# Patient Record
Sex: Male | Born: 1945 | Race: White | Hispanic: No | Marital: Married | Smoking: Former smoker
Health system: Southern US, Community
[De-identification: ages and names within clinical notes are randomized; demographics above are authoritative.]

## PROBLEM LIST (undated history)

## (undated) DIAGNOSIS — K760 Fatty (change of) liver, not elsewhere classified: Secondary | ICD-10-CM

## (undated) DIAGNOSIS — R011 Cardiac murmur, unspecified: Secondary | ICD-10-CM

## (undated) DIAGNOSIS — H6980 Other specified disorders of Eustachian tube, unspecified ear: Secondary | ICD-10-CM

## (undated) DIAGNOSIS — I517 Cardiomegaly: Secondary | ICD-10-CM

## (undated) DIAGNOSIS — Z77090 Contact with and (suspected) exposure to asbestos: Secondary | ICD-10-CM

## (undated) DIAGNOSIS — Q2381 Bicuspid aortic valve: Secondary | ICD-10-CM

## (undated) DIAGNOSIS — N486 Induration penis plastica: Secondary | ICD-10-CM

## (undated) DIAGNOSIS — N200 Calculus of kidney: Secondary | ICD-10-CM

## (undated) DIAGNOSIS — I10 Essential (primary) hypertension: Secondary | ICD-10-CM

## (undated) DIAGNOSIS — R410 Disorientation, unspecified: Secondary | ICD-10-CM

## (undated) DIAGNOSIS — H6990 Unspecified Eustachian tube disorder, unspecified ear: Secondary | ICD-10-CM

## (undated) DIAGNOSIS — G4733 Obstructive sleep apnea (adult) (pediatric): Secondary | ICD-10-CM

## (undated) DIAGNOSIS — H9190 Unspecified hearing loss, unspecified ear: Secondary | ICD-10-CM

## (undated) DIAGNOSIS — E785 Hyperlipidemia, unspecified: Secondary | ICD-10-CM

## (undated) DIAGNOSIS — U071 COVID-19: Secondary | ICD-10-CM

## (undated) DIAGNOSIS — Z8719 Personal history of other diseases of the digestive system: Secondary | ICD-10-CM

## (undated) DIAGNOSIS — R7303 Prediabetes: Secondary | ICD-10-CM

## (undated) DIAGNOSIS — E119 Type 2 diabetes mellitus without complications: Secondary | ICD-10-CM

## (undated) DIAGNOSIS — I351 Nonrheumatic aortic (valve) insufficiency: Secondary | ICD-10-CM

## (undated) DIAGNOSIS — K219 Gastro-esophageal reflux disease without esophagitis: Secondary | ICD-10-CM

## (undated) DIAGNOSIS — R319 Hematuria, unspecified: Secondary | ICD-10-CM

## (undated) DIAGNOSIS — I712 Thoracic aortic aneurysm, without rupture: Secondary | ICD-10-CM

## (undated) DIAGNOSIS — Q231 Congenital insufficiency of aortic valve: Secondary | ICD-10-CM

## (undated) DIAGNOSIS — I480 Paroxysmal atrial fibrillation: Secondary | ICD-10-CM

## (undated) DIAGNOSIS — I350 Nonrheumatic aortic (valve) stenosis: Secondary | ICD-10-CM

## (undated) DIAGNOSIS — I2510 Atherosclerotic heart disease of native coronary artery without angina pectoris: Secondary | ICD-10-CM

## (undated) DIAGNOSIS — I509 Heart failure, unspecified: Secondary | ICD-10-CM

## (undated) DIAGNOSIS — N183 Chronic kidney disease, stage 3 (moderate): Secondary | ICD-10-CM

## (undated) HISTORY — DX: Fatty (change of) liver, not elsewhere classified: K76.0

## (undated) HISTORY — DX: COVID-19: U07.1

## (undated) HISTORY — DX: Contact with and (suspected) exposure to asbestos: Z77.090

## (undated) HISTORY — PX: LEFT ATRIAL APPENDAGE OCCLUSION: SHX173A

## (undated) HISTORY — PX: SHOULDER SURGERY: SHX246

## (undated) HISTORY — DX: Chronic kidney disease, stage 3 (moderate): N18.3

## (undated) HISTORY — DX: Congenital insufficiency of aortic valve: Q23.1

## (undated) HISTORY — DX: Disorientation, unspecified: R41.0

## (undated) HISTORY — DX: Paroxysmal atrial fibrillation: I48.0

## (undated) HISTORY — DX: Thoracic aortic aneurysm, without rupture: I71.2

## (undated) HISTORY — DX: Calculus of kidney: N20.0

## (undated) HISTORY — DX: Bicuspid aortic valve: Q23.81

## (undated) HISTORY — DX: Type 2 diabetes mellitus without complications: E11.9

## (undated) HISTORY — DX: Atherosclerotic heart disease of native coronary artery without angina pectoris: I25.10

## (undated) HISTORY — DX: Cardiac murmur, unspecified: R01.1

## (undated) HISTORY — DX: Essential (primary) hypertension: I10

## (undated) HISTORY — DX: Prediabetes: R73.03

## (undated) HISTORY — DX: Hyperlipidemia, unspecified: E78.5

## (undated) HISTORY — PX: COLONOSCOPY: SHX174

## (undated) HISTORY — DX: Gastro-esophageal reflux disease without esophagitis: K21.9

---

## 2003-02-21 ENCOUNTER — Ambulatory Visit (HOSPITAL_COMMUNITY): Admission: RE | Admit: 2003-02-21 | Discharge: 2003-02-21 | Payer: Self-pay | Admitting: Gastroenterology

## 2008-04-30 ENCOUNTER — Emergency Department (HOSPITAL_BASED_OUTPATIENT_CLINIC_OR_DEPARTMENT_OTHER): Admission: EM | Admit: 2008-04-30 | Discharge: 2008-04-30 | Payer: Self-pay | Admitting: Emergency Medicine

## 2008-06-18 ENCOUNTER — Ambulatory Visit: Payer: Self-pay | Admitting: Cardiology

## 2008-06-26 ENCOUNTER — Ambulatory Visit: Payer: Self-pay

## 2008-06-26 ENCOUNTER — Encounter: Payer: Self-pay | Admitting: Cardiology

## 2008-07-02 ENCOUNTER — Ambulatory Visit: Payer: Self-pay | Admitting: Cardiology

## 2008-07-10 ENCOUNTER — Encounter: Payer: Self-pay | Admitting: Cardiology

## 2008-07-10 ENCOUNTER — Ambulatory Visit (HOSPITAL_COMMUNITY): Admission: RE | Admit: 2008-07-10 | Discharge: 2008-07-10 | Payer: Self-pay | Admitting: Cardiology

## 2008-07-10 ENCOUNTER — Ambulatory Visit: Payer: Self-pay | Admitting: Cardiology

## 2008-07-16 DIAGNOSIS — E785 Hyperlipidemia, unspecified: Secondary | ICD-10-CM | POA: Insufficient documentation

## 2008-07-16 DIAGNOSIS — E1159 Type 2 diabetes mellitus with other circulatory complications: Secondary | ICD-10-CM | POA: Insufficient documentation

## 2008-07-16 DIAGNOSIS — I10 Essential (primary) hypertension: Secondary | ICD-10-CM

## 2009-04-19 ENCOUNTER — Telehealth: Payer: Self-pay | Admitting: Cardiology

## 2009-05-02 ENCOUNTER — Ambulatory Visit: Payer: Self-pay

## 2009-05-02 ENCOUNTER — Encounter: Payer: Self-pay | Admitting: Cardiology

## 2009-05-02 ENCOUNTER — Ambulatory Visit: Payer: Self-pay | Admitting: Cardiology

## 2009-05-02 ENCOUNTER — Ambulatory Visit (HOSPITAL_COMMUNITY): Admission: RE | Admit: 2009-05-02 | Discharge: 2009-05-02 | Payer: Self-pay | Admitting: Cardiology

## 2009-05-15 ENCOUNTER — Ambulatory Visit: Payer: Self-pay | Admitting: Cardiology

## 2009-05-27 ENCOUNTER — Ambulatory Visit: Payer: Self-pay | Admitting: Cardiology

## 2009-05-31 LAB — CONVERTED CEMR LAB
BUN: 19 mg/dL (ref 6–23)
CO2: 30 meq/L (ref 19–32)
Calcium: 9.2 mg/dL (ref 8.4–10.5)
Chloride: 106 meq/L (ref 96–112)
Creatinine, Ser: 1.4 mg/dL (ref 0.4–1.5)
GFR calc non Af Amer: 54.44 mL/min (ref >60)
Glucose, Bld: 129 mg/dL — ABNORMAL HIGH (ref 70–99)
Potassium: 4 meq/L (ref 3.5–5.1)
Sodium: 143 meq/L (ref 135–145)

## 2009-06-17 ENCOUNTER — Ambulatory Visit: Payer: Self-pay | Admitting: Cardiology

## 2010-03-20 ENCOUNTER — Telehealth: Payer: Self-pay | Admitting: Cardiology

## 2010-03-20 DIAGNOSIS — I712 Thoracic aortic aneurysm, without rupture: Secondary | ICD-10-CM | POA: Insufficient documentation

## 2010-03-27 ENCOUNTER — Telehealth: Payer: Self-pay | Admitting: Cardiology

## 2010-06-18 ENCOUNTER — Ambulatory Visit (HOSPITAL_COMMUNITY)
Admission: RE | Admit: 2010-06-18 | Discharge: 2010-06-18 | Payer: Self-pay | Source: Home / Self Care | Attending: Cardiology | Admitting: Cardiology

## 2010-06-18 ENCOUNTER — Ambulatory Visit (HOSPITAL_COMMUNITY): Admission: RE | Admit: 2010-06-18 | Payer: Self-pay | Source: Home / Self Care | Admitting: Cardiology

## 2010-06-18 LAB — BASIC METABOLIC PANEL
BUN: 15 mg/dL (ref 6–23)
CO2: 27 mEq/L (ref 19–32)
Calcium: 9.3 mg/dL (ref 8.4–10.5)
Chloride: 105 mEq/L (ref 96–112)
Creatinine, Ser: 1.23 mg/dL (ref 0.4–1.5)
GFR calc Af Amer: >60 mL/min (ref >60)
GFR calc non Af Amer: 59 mL/min — ABNORMAL LOW (ref >60)
Glucose, Bld: 112 mg/dL — ABNORMAL HIGH (ref 70–99)
Potassium: 4.1 mEq/L (ref 3.5–5.1)
Sodium: 140 mEq/L (ref 135–145)

## 2010-06-19 ENCOUNTER — Other Ambulatory Visit: Payer: Self-pay | Admitting: Cardiology

## 2010-06-19 ENCOUNTER — Ambulatory Visit
Admission: RE | Admit: 2010-06-19 | Discharge: 2010-06-19 | Payer: Self-pay | Source: Home / Self Care | Attending: Cardiology | Admitting: Cardiology

## 2010-06-19 ENCOUNTER — Ambulatory Visit (HOSPITAL_COMMUNITY)
Admission: RE | Admit: 2010-06-19 | Discharge: 2010-06-19 | Payer: Self-pay | Source: Home / Self Care | Attending: Cardiology | Admitting: Cardiology

## 2010-06-19 ENCOUNTER — Ambulatory Visit: Admission: RE | Admit: 2010-06-19 | Discharge: 2010-06-19 | Payer: Self-pay | Source: Home / Self Care

## 2010-06-19 ENCOUNTER — Encounter: Payer: Self-pay | Admitting: Cardiology

## 2010-06-19 DIAGNOSIS — R0602 Shortness of breath: Secondary | ICD-10-CM | POA: Insufficient documentation

## 2010-06-19 DIAGNOSIS — R079 Chest pain, unspecified: Secondary | ICD-10-CM | POA: Insufficient documentation

## 2010-06-19 LAB — CBC WITH DIFFERENTIAL/PLATELET
Basophils Absolute: 0 10*3/uL (ref 0.0–0.1)
Basophils Relative: 0.7 % (ref 0.0–3.0)
Eosinophils Absolute: 0.2 10*3/uL (ref 0.0–0.7)
Eosinophils Relative: 3.6 % (ref 0.0–5.0)
HCT: 44.2 % (ref 39.0–52.0)
Hemoglobin: 15.1 g/dL (ref 13.0–17.0)
Lymphocytes Relative: 27.4 % (ref 12.0–46.0)
Lymphs Abs: 1.3 10*3/uL (ref 0.7–4.0)
MCHC: 34.2 g/dL (ref 30.0–36.0)
MCV: 93.9 fl (ref 78.0–100.0)
Monocytes Absolute: 0.3 10*3/uL (ref 0.1–1.0)
Monocytes Relative: 6.4 % (ref 3.0–12.0)
Neutro Abs: 2.9 10*3/uL (ref 1.4–7.7)
Neutrophils Relative %: 61.9 % (ref 43.0–77.0)
Platelets: 182 10*3/uL (ref 150.0–400.0)
RBC: 4.71 Mil/uL (ref 4.22–5.81)
RDW: 13 % (ref 11.5–14.6)
WBC: 4.6 10*3/uL (ref 4.5–10.5)

## 2010-06-19 LAB — BASIC METABOLIC PANEL
BUN: 22 mg/dL (ref 6–23)
CO2: 30 mEq/L (ref 19–32)
Calcium: 9.5 mg/dL (ref 8.4–10.5)
Chloride: 101 mEq/L (ref 96–112)
Creatinine, Ser: 1.2 mg/dL (ref 0.4–1.5)
GFR: 65.44 mL/min (ref >60.00)
Glucose, Bld: 134 mg/dL — ABNORMAL HIGH (ref 70–99)
Potassium: 3.9 mEq/L (ref 3.5–5.1)
Sodium: 140 mEq/L (ref 135–145)

## 2010-06-19 LAB — PROTIME-INR
INR: 1 ratio (ref 0.8–1.0)
Prothrombin Time: 11 s (ref 9.7–11.8)

## 2010-06-23 ENCOUNTER — Telehealth: Payer: Self-pay | Admitting: Cardiology

## 2010-06-24 ENCOUNTER — Ambulatory Visit
Admission: RE | Admit: 2010-06-24 | Payer: Self-pay | Source: Home / Self Care | Attending: Cardiology | Admitting: Cardiology

## 2010-06-24 HISTORY — PX: CARDIAC CATHETERIZATION: SHX172

## 2010-06-26 ENCOUNTER — Encounter: Payer: Self-pay | Admitting: Cardiology

## 2010-06-27 ENCOUNTER — Telehealth (INDEPENDENT_AMBULATORY_CARE_PROVIDER_SITE_OTHER): Payer: Self-pay | Admitting: *Deleted

## 2010-06-30 ENCOUNTER — Ambulatory Visit: Admission: RE | Admit: 2010-06-30 | Discharge: 2010-06-30 | Payer: Self-pay | Source: Home / Self Care

## 2010-06-30 ENCOUNTER — Encounter: Payer: Self-pay | Admitting: Internal Medicine

## 2010-06-30 ENCOUNTER — Encounter (HOSPITAL_COMMUNITY)
Admission: RE | Admit: 2010-06-30 | Discharge: 2010-07-15 | Payer: Self-pay | Source: Home / Self Care | Attending: Cardiology | Admitting: Cardiology

## 2010-07-07 ENCOUNTER — Ambulatory Visit: Admit: 2010-07-07 | Payer: Self-pay | Admitting: Cardiology

## 2010-07-15 NOTE — Progress Notes (Signed)
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

## 2010-07-15 NOTE — Progress Notes (Signed)
Summary: scheduling MRA/ECHO   ---- 03/27/2010 4:26 PM, Omar Person wrote: ---- Converted from flag ---- Thurston Hole call Mr. Mabey  on 03/21/10  and left vm/ call 10/13 and spoke with wife.  Per wife husband [place] and he want to wait til next year to set up.  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

## 2010-07-17 NOTE — Progress Notes (Signed)
Summary: question re Catheterization   Phone Note Call from Patient Call back at [phone]   Caller: Patient Reason for Call: Talk to Nurse Summary of Call: pt has a cold pt wants to know if its still okay for him to get Cardiac Catheterization on Tuesday January 10,2012 with Dr. Marca Ancona.  Initial call taken by: Roe Coombs,  June 23, 2010 10:04 AM     Appended Document: question re Catheterization OK as long as he does not have a fever.   Appended Document: question re Catheterization pt aware

## 2010-07-17 NOTE — Miscellaneous (Signed)
Summary: MRA/MRI  Clinical Lists Changes  Orders: Added new Referral order of MRA (MRA) - Signed

## 2010-07-17 NOTE — Progress Notes (Signed)
Summary: Nuclear Pre-Procedure  Phone Note Outgoing Call Call back at [facility] Phone [phone]   Call placed by: Stanton Kidney, EMT-P,  June 27, 2010 3:42 PM Call placed to: Patient Action Taken: Phone Call Completed Summary of Call: Reviewed information on Myoview Information Sheet (see scanned document for further details).  Spoke with the patient. Stanton Kidney, EMT-P  June 27, 2010 3:45 PM     Nuclear Med Background Indications for Stress Test: Evaluation for Ischemia   History: Echo, Heart Catheterization, Myocardial Perfusion Study  History Comments: 1/10 MPS: NL, EF=56% 06/19/10 Echo: EF=55%-60% 06/24/10 Heart Cath: NL  Symptoms: Chest Pain, DOE, Palpitations, Rapid HR    Nuclear Pre-Procedure Cardiac Risk Factors: Family History - CAD, History of Smoking, Hypertension, Lipids, RBBB Height (in): 71

## 2010-07-17 NOTE — Assessment & Plan Note (Signed)
Summary: Cardiology Nuclear Testing  Nuclear Med Background Indications for Stress Test: Evaluation for Ischemia   History: Echo, Heart Catheterization, Myocardial Perfusion Study  History Comments: 1/10 MPS: NL, EF=56% 06/19/10 Echo: EF=55%-60% 06/24/10 Heart Cath: NL  Symptoms: Chest Pain, DOE, Palpitations, Rapid HR, SOB    Nuclear Pre-Procedure Cardiac Risk Factors: Family History - CAD, History of Smoking, Hypertension, Lipids, RBBB Caffeine/Decaff Intake: none NPO After: 12:00 PM Lungs: clear IV 0.9% NS with Angio Cath: 22g     IV Site: R Hand IV Started by: Cathlyn Parsons, RN Chest Size (in) 44     Height (in): 71 Weight (lb): 211 BMI: 29.53  Nuclear Med Study 1 or 2 day study:  1 day     Stress Test Type:  Stress Reading MD:  Arvilla Meres, MD     Referring MD:  D.McLean Resting Radionuclide:  Technetium 66m Tetrofosmin     Resting Radionuclide Dose:  11 mCi  Stress Radionuclide:  Technetium 40m Tetrofosmin     Stress Radionuclide Dose:  33 mCi   Stress Protocol Exercise Time (min):  10 min     Max HR:  153 bpm     Predicted Max HR:  157 bpm  Max Systolic BP: 206 mm Hg     Percent Max HR:  97.45 %     METS: 11.70 Rate Pressure Product:  [address]    Stress Test Technologist:  Milana Na, EMT-P     Nuclear Technologist:  Domenic Polite, CNMT  Rest Procedure  Myocardial perfusion imaging was performed at rest 45 minutes following the intravenous administration of Technetium 42m Tetrofosmin.  Stress Procedure  The patient exercised for 10:00. The patient stopped due to fatigue, sob, and denied any chest pain.  There were no significant ST-T wave changes and rare pacs/pvcs.  Technetium 31m Tetrofosmin was injected at peak exercise and myocardial perfusion imaging was performed after a brief delay.  QPS Raw Data Images:  Normal; no motion artifact; normal heart/lung ratio. Stress Images:  Normal homogeneous uptake in all areas of the myocardium. Rest  Images:  Normal homogeneous uptake in all areas of the myocardium. Subtraction (SDS):  Normal Transient Ischemic Dilatation:  .87  (Normal <1.22)  Lung/Heart Ratio:  .34  (Normal <0.45)  Quantitative Gated Spect Images QGS EDV:  79 ml QGS ESV:  25 ml QGS EF:  69 % QGS cine images:  Normal wall motion.   Findings Normal nuclear study      Overall Impression  Exercise Capacity: Good exercise capacity. BP Response: Hypertensive blood pressure response. Clinical Symptoms: There is dyspnea. ECG Impression: No significant ST segment change suggestive of ischemia. Overall Impression: Normal stress nuclear study.  Appended Document: Cardiology Nuclear Testing Discussed with patient.

## 2010-07-17 NOTE — Letter (Signed)
Summary: Cardiac Catheterization Instructions- [facility]  [employer], Main Office  [address]   [place], [place] [address]   Phone: [phone]  Fax: [phone]     06/19/2010 MRN: [id number]  Samuel Willis [address] [place], [place]  [address]  Dear [name],   You are scheduled for a Cardiac Catheterization on Tuesday January 10,2012 with Dr. Marca Ancona.  Please arrive to the 1st floor of the Heart and Vascular Center at [facility] at 7:30 am on the day of your procedure. Please do not arrive before 6:30 a.m. Call the Heart and Vascular Center at [phone] if you are unable to make your appointmnet. The Code to get into the parking garage under the building is 0030. Take the elevators to the 1st floor. You must have someone to drive you home. Someone must be with you for the first 24 hours after you arrive home. Please wear clothes that are easy to get on and off and wear slip-on shoes. Do not eat or drink after midnight except water with your medications that morning. Bring all your medications and current insurance cards with you.    __x_ Make sure you take your aspirin.  _x__ You may take ALL of your medications with water that morning.     The usual length of stay after your procedure is 2 to 3 hours. This can vary.  If you have any questions, please call the office at the number listed above.   Katina Dung, RN, BSN

## 2010-07-17 NOTE — Assessment & Plan Note (Signed)
Summary: 10:00/f1y/mj  Medications Added COQ-10 50 MG CAPS (COENZYME Q10) once daily FLEXERIL 10 MG TABS (CYCLOBENZAPRINE HCL) once daily PEPCID AC 10 MG TABS (FAMOTIDINE) once daily      Allergies Added: NKDA  Visit Type:  1 year follow up   CC:  palpitations, shortness of breath, and CP.  History of Present Illness: 65 yo with history of bicuspid aortic valve and mild AS/mild AR as well as rare paroxysmal atrial fibrillation presents for followup.  Echo today showed mild LV hypertrophy, grade II diastolic dysfunction, bicuspid aortic valve with mild AS.  MRA aorta showed aortic root 3.7 cm and ascending aorta 3.7 cm.  Patient came in to the office today because he has been significantly more short of breath with exertion for the last 6 months.  He is able to do mild exertion without any problem.  Moderate to heavy exertion, however, now gets him very winded.  Carrying a heavy load, using a chainsaw, or walking quickly up an incline will leave him profoundly short of breath.  Prior to 6 months ago, he had no problems with dyspnea.  No orthopnea or PND.  Two weeks ago, he had sharp severe central chest pain.  This lasted about 2 days then resolved.  It was nonexertional.  He has not had chest pain before or since.  He still feels like he occasionally goes into atrial fibrillation.  He will feel his heart race irregularly at 160 bpm about 4 times a year.  It never lasts more than about 3-4 hours.    ECG: NSR, iRBBB  Labs (12/10): creatinine 1.4  Current Medications (verified): 1)  Benazepril-Hydrochlorothiazide 20-12.5 Mg Tabs (Benazepril-Hydrochlorothiazide) .... Take Two Tablets Daily 2)  Fish Oil 300 Mg Caps (Omega-3 Fatty Acids) .... Once Daily 3)  Aspirin Ec 325 Mg Tbec (Aspirin) .... Take One Tablet By Mouth Daily 4)  Zocor 40 Mg Tabs (Simvastatin) .... One Tablet in The Evening 5)  Coq-10 50 Mg Caps (Coenzyme Q10) .... Once Daily 6)  Flexeril 10 Mg Tabs (Cyclobenzaprine Hcl) ....  Once Daily 7)  Pepcid Ac 10 Mg Tabs (Famotidine) .... Once Daily  Allergies (verified): No Known Drug Allergies  Past History:  Past Medical History: 1. Paroxysmal atrial fibrillation.  Patient has a brief episode of irregular heart beating every 1-2 years.  One was documented as atrial fibrillation.  I have spoken to the patient about coumadin in the past.  His CHADSVASC score is 1.  He decided against Coumadin.  He is on aspirin 325 mg a day. 2. Hypertension. 3. Hyperlipidemia. 4. Exercise treadmill Myoview done in January 2010.  The patient exercised for 10 minutes and 46 seconds.  He stopped due to fatigue.  EF was 56%.  There was normal perfusion with no evidence for ischemia or infarction. 5. Aortic stenosis and aortic regurgitation (bicuspid aortic valve).  The patient did have an echocardiogram done on June 26, 2008.  EF was 65%.  There were no regional wall motion abnormalities.  There was moderate aortic regurgitation.  There was mild aortic stenosis by mean gradient which was 12 mmHg.  There was mild ascending aorta and aortic root dilation. There was mild mitral regurgitation, mild left atrial enlargement, and right atrial enlargement.   TEE was done, confirming a functionally bicuspid aortic valve with fusion of the right and noncoronary cusps.  There was mild aortic stenosis and moderate aortic insufficiency.  The ascending aorta was mildly dilated at 3.9 cm. TTE (1/10) showed EF 60%  with mild LVH, mild AS (mean gradient 12), mild to moderate AR, normal LV size.  TTE (1/12) with EF 60%, mild LVH, moderate diastolic dysfunction, mild AS (mean gradient 11 mmHg), mild AI, normal RV. MRA chest (1/12) with 3.7 cm aortic root and ascending aorta.   Family History: Reviewed history from 05/15/2009 and no changes required. The patient's mother has a history of stroke, also has questionable history of atrial fibrillation.  The patient's father had an MI in his 31s.   Social  History: Reviewed history from 05/15/2009 and no changes required. The patient is married, lives in [place], is a nonsmoker.  He owns several businesses.  He is not drinking alcohol.  He works out with a Psychologist, educational a couple of days a week and gets a significant amount of cardiovascular exercise.   Review of Systems       All systems reviewed and negative except as per HPI.   Vital Signs:  Patient profile:   65 year old male Height:      71 inches Weight:      219 pounds BMI:     30.65 Pulse rate:   67 / minute BP sitting:   116 / 68  (left arm) Cuff size:   regular  Vitals Entered By: Caralee Ates CMA (June 19, 2010 9:41 AM)  Physical Exam  General:  Well developed, well nourished, in no acute distress. Neck:  Neck supple, no JVD. No masses, thyromegaly or abnormal cervical nodes. Lungs:  Clear bilaterally to auscultation and percussion. Heart:  Non-displaced PMI, chest non-tender; regular rate and rhythm, S1, S2 without rubs or gallops. 2/6 early systolic crescendo-decrescendo murmur at RUSB.  No diastolic murmur.  Carotid upstroke normal, no bruit.  No edema, no varicosities. Abdomen:  Bowel sounds positive; abdomen soft and non-tender without masses, organomegaly, or hernias noted. No hepatosplenomegaly. Extremities:  No clubbing or cyanosis. Neurologic:  Alert and oriented x 3. Psych:  Normal affect.   Impression & Recommendations:  Problem # 1:  SHORTNESS OF BREATH (ICD-786.05) Patient has worrisome new exertional dyspnea.  This occurs with moderate to heavy exertion and is new for him.  Echo showed no significant progression of aortic stenosis.  He had one episode of prolonged chest pain but has no evidence for MI on ECG.  No volume overload on exam.  We had a long discussion regarding LHC versus myoview.  We opted for catheterization.  Given his exertional symptoms and his risk factors, I think that this is reasonable.  I explained to him the 06/998 risk of severe  complication from the procedure.  He will continue ASA, statin, ACEI.  I will call his PCP to get most recent lipids record.   Problem # 2:  BICUSPID AORTIC VALVE (ICD-746.4) Patient has a bicuspid aortic valve with mild AS, mild AR, and a mildly dilated ascending aorta (3.7 cm by MRA 1/12).  Valvular disease has remained stable between 1/10 and 1/12.  I will continue monitoring AS with echo in 1 year.  Can repeat MRA of the aorta in 2 years.   Problem # 3:  ATRIAL FIBRILLATION (ICD-427.31) Paroxysmal.  Patient is symptomatic when it occurs, which is only 3-4 times a year. One episode was documented by ECG as atrial fibrillation.   CHADSVASC score is 1, but if coronary disease is found on cath, it will be 2.  In that case, we will need to readdress anticoagulation.   Other Orders: TLB-BMP (Basic Metabolic Panel-BMET) (80048-METABOL) TLB-CBC Platelet -  w/Differential (85025-CBCD) TLB-PT (Protime) (85610-PTP)  Patient Instructions: 1)  Your physician recommends that you have lab today---BMP/CBC/PT  786.50  786.09 2)  Your physician has requested that you have a cardiac catheterization.  Cardiac catheterization is used to diagnose and/or treat various heart conditions. Doctors may recommend this procedure for a number of different reasons. The most common reason is to evaluate chest pain. Chest pain can be a symptom of coronary artery disease (CAD), and cardiac catheterization can show whether plaque is narrowing or blocking your heart's arteries. This procedure is also used to evaluate the valves, as well as measure the blood flow and oxygen levels in different parts of your heart.  For further information please visit [email or web address]  Please follow instruction sheet, as given. 3)  TUESDAY JANUARY [id number] 4)  Your physician recommends that you schedule a follow-up appointment in: 10-14 days after the cardiac catheterization with Dr Shirlee Latch.

## 2010-10-28 NOTE — Assessment & Plan Note (Signed)
[facility]                            CARDIOLOGY OFFICE NOTE   Samuel Willis, Samuel Willis                        MRN:          [id number]  DATE:06/18/2008                            DOB:          1945-12-20    PRIMARY CARE PHYSICIAN:  Vikki Ports, MD   HISTORY OF PRESENT ILLNESS:  This is a 65 year old with a history of  hypertension, hyperlipidemia, and paroxysmal atrial fibrillation who  presents to Cardiology Clinic for evaluation.  The patient had an  initial episode that was probably atrial fibrillation several years ago  while he was in [place] hunting.  He had an episode where his heart  rate went very fast and was irregular.  This lasted a few hours and then  resolved.  He did not go to the doctor at that time.  He had no other  problems since then until November 2009, when he woke up one night with  chest tightness and had a fast and irregular heart rate.  The chest  tightness was substernal, there was no radiation.  The patient went to  the emergency department.  He was found to be in atrial fibrillation  with a rapid response with the rate in the 150s.  The atrial  fibrillation actually spontaneously converted to normal sinus rhythm  soon after he got to the emergency department.  He was monitored for few  hours and sent home.  Cardiac enzymes were negative.  Of note, the  patient had a cold just before this episode.  He was on Claritin,  Robitussin, and clarithromycin at that time.  The patient has had no  further episodes of tachycardia.  He does quite well in general.  He  does a significant amount of cardiovascular exercise.  He works out with  a Psychologist, educational a couple of days a week.  He has no chest tightness or dyspnea  with exertion.  He does tell me that he snores at night, and he has some  generalized fatigue during the day; however, he does not have any  daytime sleepiness per se.   PAST MEDICAL HISTORY:  1. Paroxysmal atrial  fibrillation.  The patient has had two episodes      that have been recognized so far, one several years ago in which he      did not go to a doctor and then the second in November 2009, both      lasted for only a matter of hours.  2. Hypertension.  3. Hyperlipidemia.   MEDICATIONS:  1. Amlodipine 5 mg daily.  2. Benazepril/HCTZ 20/12.5.  3. Zocor 20 mg daily.  4. Ranitidine.  5. Viagra p.r.n.   FAMILY HISTORY:  The patient's mother has a history of stroke, also has  questionable history of atrial fibrillation.  The patient's father had  an MI in his 64s.   SOCIAL HISTORY:  The patient is married, lives in [place], is a  nonsmoker.  He is not drinking alcohol.  He works out with a Psychologist, educational a  couple of days a week and gets a significant amount  of cardiovascular  exercise.   REVIEW OF SYSTEMS:  Negative except as noted in the history of present  illness.   LABORATORY DATA:  From November 2009, D-dimer is negative, cardiac  enzymes were negative.  From December 2009, creatinine was 1.3.  EKG  today, normal sinus rhythm, there are nonspecific lateral and  anterolateral T-wave changes.   PHYSICAL EXAMINATION:  VITAL SIGNS:  Blood pressure 120/86, heart rate  69 and regular.  GENERAL:  This is a well-developed male in no apparent distress.  NEUROLOGIC:  Alert and oriented x3.  Normal affect.  LUNGS:  Clear to auscultation bilaterally with normal respiratory  effort.  CARDIOVASCULAR:  Heart regular.  S1, S2.  There is no S3 or S4.  There  is a 2/6 crescendo-decrescendo early peaking murmur at the right upper  sternal border.  There is also a 2/6 systolic murmur at the apex.  There  is no peripheral edema.  There are 2+ posterior tibial pulses  bilaterally.  There is no carotid bruit.  NECK:  There is no JVD.  There is no thyromegaly or thyroid nodule.  HEENT:  Normal exam.  ABDOMEN:  Soft, nontender.  No hepatosplenomegaly.  Normal bowel sounds.  EXTREMITIES:  No clubbing or  cyanosis.  MUSCULOSKELETAL:  Normal exam.  SKIN:  Normal exam.   ASSESSMENT AND PLAN:  This is a 65 year old with hypertension,  hyperlipidemia, and paroxysmal atrial fibrillation, who presents to  Cardiology Clinic for evaluation of;  1. Atrial fibrillation.  The patient does have paroxysmal atrial      fibrillation.  He has had two episodes.  His CHADS2 score is 1.  I      did talk to him at length about his risk of stroke.  He lives an      active lifestyle and really does not want to start on Coumadin.  He      is on the borderline for risk with a CHADS2 score of 1, but I think      given his wish, it would be reasonable to start on aspirin 325 mg      daily instead of Coumadin.  He is on no rate-controlling      medications currently.  I do plan on getting an exercise treadmill      Myoview, so I will avoid a beta-blocker before his Myoview.  After      that when I will have him back for followup, we will plan on going      ahead and starting him on a beta-blocker for rate-control.      Additionally, we will get an echocardiogram to assess any      structural heart problems.  He does have a murmur on exam.  This      will allow [place] to assess the cause of the murmur.  Finally, the      patient does give a history that is somewhat consistent with      obstructive sleep apnea.  In the setting of his hypertension and      his atrial fibrillation, I think it would be reasonable to do a      sleep study.  We did talk about this at length.  We will do the      cardiac testing at first and then we will think about the sleep      study.  We will talk about that again at his next appointment.  2. Hypertension.  The patient's blood  pressure is under good control.      As mentioned, I will probably take him off his amlodipine when he      comes back again and put him on a rate-controlling agent such as      Toprol-XL, given his history of paroxysmal atrial fibrillation with      rapid  ventricular response.  3. Coronary artery disease.  The patient does have a number of risk      factors for coronary artery disease including hypertension,      hyperlipidemia, and a family history of coronary artery disease.      He did have some chest tightness in the setting of his atrial      fibrillation with rapid ventricular response in November.  We are      going to go ahead and obtain an exercise treadmill Myoview to      assess for any evidence of ischemia.  4. Hyperlipidemia.  The patient just started on Zocor.  He will need      lipids and LFTs in 3      months.  5. I will see the patient back in followup in 3 weeks after his tests      are done.     Marca Ancona, MD  Electronically Signed    DM/MedQ  DD: 06/18/2008  DT: 06/19/2008  Job #: [id number]   cc:   Vikki Ports, M.D.

## 2010-10-28 NOTE — Assessment & Plan Note (Signed)
[facility]                            CARDIOLOGY OFFICE NOTE   Samuel Willis, Samuel Willis                        MRN:          [id number]  DATE:07/02/2008                            DOB:          [birth year]    PRIMARY CARE PHYSICIAN:  Vikki Ports, MD   HISTORY OF PRESENT ILLNESS:  This is a 65 year old with a history of  paroxysmal atrial fibrillation, hypertension, hyperlipidemia, who  presents to Cardiology Clinic for followup of his echocardiogram and  treadmill Myoview.  The patient's most recent episode of atrial  fibrillation was back in November 2009.  He had not had an episode prior  that for several years.  Since November, he has had no further episodes  of fast and irregular heart rate.  He did have chest tightness along  with his atrial fibrillation in November.  Therefore, he underwent an  exercise treadmill Myoview in January 2010.  He exercised for 10 minutes  and 46 seconds.  He had normal perfusion images showing no evidence of  ischemia or infarction.  He also had an echocardiogram done given his  history of paroxysmal atrial fibrillation.  He was found to have normal  LV systolic function; however, he had a possibly bicuspid aortic valve  with moderate aortic regurgitation and visually moderate aortic stenosis  though the mean gradient was only 12 mmHg.  The ascending aorta at the  aortic root appeared to be mildly dilated.  This is a new finding.  The  patient does have excellent exercise tolerance.  He exercised for 10  minutes and 46 seconds on the treadmill.  He does a significant amount  of exercise, working out with a trainer a several times a week.  He has  no chest tightness or dyspnea with exertion.  He does of note have some  snoring at night and some generalized fatigue during the day; however,  he does not have daytime sleepiness per se.   PAST MEDICAL HISTORY:  1. Paroxysmal atrial fibrillation.  The patient has had 2  episodes      that have been recognized so far, one several years ago that was      probably atrial fib but he did not go to a doctor and then the      second in November 2009, both lasted only for a matter of hours.  I      did tell the patient about Coumadin in the last appointment.  His      CHADS-2 score was 1.  He decided against Coumadin.  He is on      aspirin 325 mg a day.  2. Hypertension.  3. Hyperlipidemia.  4. Exercise treadmill Myoview done in January 2010.  The patient      exercised for 10 minutes and 46 seconds.  He stopped due to      fatigue.  EF was 56%.  There was normal perfusion with no evidence      for ischemia or infarction.  5. Aortic stenosis and aortic regurgitation.  The patient did have an  echocardiogram done on June 26, 2008.  EF was 65%.  There were      no regional wall motion abnormalities.  There was moderate aortic      regurgitation.  There was mild aortic stenosis by mean gradient      which was 12 mmHg; however, visually the aortic stenosis appeared      moderate.  There was mild ascending aorta and aortic root dilation.      There was mild mitral regurgitation, mild left atrial enlargement,      and right atrial enlargement.   MEDICATIONS:  1. Amlodipine 5 mg a day.  2. Benazepril/hydrochlorothiazide 20/12.5 daily.  3. Zocor 20 mg daily.  4. Ranitidine.  5. Viagra.  6. Aspirin 325 mg daily.   FAMILY HISTORY:  The patient's mother has a history stroke, also  questionable history of atrial fibrillation, and the patient's father  had an MI in his 33s.   SOCIAL HISTORY:  The patient is married.  He lives in [place].  He is  a nonsmoker.  He has not been drinking any alcohol.  He works out with a  Psychologist, educational a couple of days a week and gets significant amount of  cardiovascular exercise.   There were no recent labs.   PHYSICAL EXAMINATION:  VITAL SIGNS:  Blood pressure is 114/76, heart  rate is 75 and regular.  GENERAL:  This is a  well-developed male in no apparent distress.  NEUROLOGIC:  Alert and oriented x3.  Normal affect.  LUNGS:  Clear to auscultation bilaterally with normal respiratory  effort.  CARDIOVASCULAR:  Heart regular.  S1 and S2.  No S3.  No S4.  There is a  2/6 crescendo-decrescendo mid peaking systolic murmur at the right upper  sternal border.  S2 is heard clearly, and there is radiation of the  murmur at the neck.  There is no peripheral edema.  There are 2+  posterior tibial pulses bilaterally.  There is no carotid bruit.  I do  not hear a diastolic murmur.  NECK:  There is no JVD.  There is no thyromegaly or thyroid nodule.  HEENT:  Normal exam.  ABDOMEN:  Soft, nontender.  No hepatosplenomegaly.  Normal bowel sounds.  EXTREMITIES:  No clubbing or cyanosis.  MUSCULOSKELETAL:  There is no joint effusion or pain.  SKIN:  There is no rash.   ASSESSMENT AND PLAN:  This is a 65 year old with hypertension,  hyperlipidemia, paroxysmal atrial fibrillation, and aortic  stenosis/aortic regurgitation in the setting of a probable bicuspid  aortic valve.  1. Atrial fibrillation.  The patient has paroxysmal atrial      fibrillation.  He has probably had 2 episodes, the first a number      of years ago, the second in November.  His CHADS-2 score is 1.  He      has been in sinus rhythm both times.  I have seen him last month      and this month.  I have talked to him at length about his risk of a      stroke.  He lives an active lifestyle and did not want to start on      Coumadin.  As I mentioned, he is on the border for risk with a      CHADS-2 score of 1; however, given his wish I think it would be      reasonable to continue on aspirin 325 mg daily at this point.  Also  today, I am going to start him on Toprol-XL as a rate control agent      in case he goes back into atrial fibrillation.  The Toprol-XL also      should help in the setting of his at least mild ascending aortic      dilation.  2.  Hypertension.  The patient's blood pressure is under good control      today.  Given his mildly dilated ascending aorta on transthoracic      echo, I would like to have him on a beta-blocker.  Therefore, we      are going to start him on Toprol-XL 25 mg daily, and as his blood      pressure is 114/76 which is excellent, I am going to have him stop      his amlodipine for now.  He will continue on his      benazepril/hydrochlorothiazide.  3. Coronary artery disease.  The patient does have risk factors for      coronary artery disease including hypertension, hyperlipidemia,      family history of coronary artery disease.  He did have a negative      exercise treadmill Myoview, however, this month.  4. Hyperlipidemia.  The patient is on Zocor.  He will need lipids and      LFTs in about 3 months.  5. Aortic valve.  The patient's transthoracic echo showed a probably      bicuspid aortic valve.  There was moderate aortic regurgitation.      There was visually moderate aortic stenosis; however, the mean      gradient was only 12 mmHg which is in the mild range.  The      ascending aorta was mildly dilated, and the aortic root was also      mildly dilated.  Given the patient's aortic valve pathology, I do      think we need to do a transesophageal echocardiogram first to      assess for thoracic aortic aneurysm and second to fully assess the      aortic valve.  We will check and see whether or not the valve is      truly bicuspid, and we will also recheck the gradient across the      valve.  I did tell the patient that with the bicuspid aortic valve,      his chances of progressing to the need for a valve replacement are      higher, and if his TEE confirms what we saw on the surface echo and      his aorta is not significantly dilated, we will plan on having him      get a followup echocardiogram in 6 months to reassess the valve.     Marca Ancona, MD  Electronically Signed    DM/MedQ   DD: 07/02/2008  DT: 07/03/2008  Job #: [id number]   cc:   Vikki Ports, M.D.

## 2010-10-31 NOTE — Op Note (Signed)
   NAME:  Samuel Willis, Samuel Willis                           ACCOUNT NO.:  [id number]   MEDICAL RECORD NO.:  [id number]                   PATIENT TYPE:  AMB   LOCATION:  ENDO                                 FACILITY:  [facility]   PHYSICIAN:  Graylin Shiver, M.D.                DATE OF BIRTH:  13-Mar-1946   DATE OF PROCEDURE:  02/21/2003  DATE OF DISCHARGE:                                 OPERATIVE REPORT   PROCEDURE:  Colonoscopy.   INDICATIONS:  Screening.   Informed consent was obtained after explanation of the risks of bleeding,  infection, and perforation.   PREMEDICATION:  Fentanyl 80 mcg IV, Versed 8 mg IV.   DESCRIPTION OF PROCEDURE:  With the patient in the left lateral decubitus  position, a rectal exam was performed and no masses were felt.  The Olympus  colonoscope was inserted into the rectum and advanced around the colon to  the cecum.  Cecal landmarks were identified.  The cecum and ascending colon  were normal.  The transverse colon was normal.  The descending colon,  sigmoid, and rectum were normal.  He tolerated the procedure well without  complications.   IMPRESSION:  Normal colonoscopy to the cecum.                                               Graylin Shiver, M.D.    [name]  D:  02/21/2003  T:  02/21/2003  Job:  [id number]   cc:   Vikki Ports, M.D.  [address]  [place]  [place] [address]  Fax: [phone]

## 2010-11-24 ENCOUNTER — Ambulatory Visit (INDEPENDENT_AMBULATORY_CARE_PROVIDER_SITE_OTHER): Payer: 59 | Admitting: Internal Medicine

## 2010-11-24 ENCOUNTER — Encounter: Payer: Self-pay | Admitting: Internal Medicine

## 2010-11-24 DIAGNOSIS — Z125 Encounter for screening for malignant neoplasm of prostate: Secondary | ICD-10-CM

## 2010-11-24 DIAGNOSIS — Z23 Encounter for immunization: Secondary | ICD-10-CM

## 2010-11-24 DIAGNOSIS — E785 Hyperlipidemia, unspecified: Secondary | ICD-10-CM

## 2010-11-24 DIAGNOSIS — I10 Essential (primary) hypertension: Secondary | ICD-10-CM

## 2010-11-24 DIAGNOSIS — R7309 Other abnormal glucose: Secondary | ICD-10-CM

## 2010-11-24 DIAGNOSIS — Z299 Encounter for prophylactic measures, unspecified: Secondary | ICD-10-CM

## 2010-11-24 DIAGNOSIS — K219 Gastro-esophageal reflux disease without esophagitis: Secondary | ICD-10-CM

## 2010-11-24 DIAGNOSIS — R1310 Dysphagia, unspecified: Secondary | ICD-10-CM

## 2010-11-24 DIAGNOSIS — R739 Hyperglycemia, unspecified: Secondary | ICD-10-CM

## 2010-11-24 LAB — HEPATIC FUNCTION PANEL
ALT: 31 U/L (ref 0–53)
AST: 30 U/L (ref 0–37)
Albumin: 4.4 g/dL (ref 3.5–5.2)
Alkaline Phosphatase: 46 U/L (ref 39–117)
Bilirubin, Direct: 0.2 mg/dL (ref 0.0–0.3)
Total Bilirubin: 0.8 mg/dL (ref 0.3–1.2)
Total Protein: 6.9 g/dL (ref 6.0–8.3)

## 2010-11-24 LAB — LIPID PANEL
Cholesterol: 126 mg/dL (ref 0–200)
HDL: 48.2 mg/dL (ref >39.00)
LDL Cholesterol: 66 mg/dL (ref 0–99)
Total CHOL/HDL Ratio: 3
Triglycerides: 58 mg/dL (ref 0.0–149.0)
VLDL: 11.6 mg/dL (ref 0.0–40.0)

## 2010-11-24 LAB — BASIC METABOLIC PANEL
BUN: 24 mg/dL — ABNORMAL HIGH (ref 6–23)
CO2: 27 mEq/L (ref 19–32)
Calcium: 9.1 mg/dL (ref 8.4–10.5)
Chloride: 107 mEq/L (ref 96–112)
Creatinine, Ser: 1.2 mg/dL (ref 0.4–1.5)
GFR: 67.98 mL/min (ref >60.00)
Glucose, Bld: 112 mg/dL — ABNORMAL HIGH (ref 70–99)
Potassium: 4.5 mEq/L (ref 3.5–5.1)
Sodium: 140 mEq/L (ref 135–145)

## 2010-11-24 LAB — PSA: PSA: 1.31 ng/mL (ref 0.10–4.00)

## 2010-11-24 LAB — HEMOGLOBIN A1C: Hgb A1c MFr Bld: 6.3 % (ref 4.6–6.5)

## 2010-11-24 MED ORDER — OMEPRAZOLE 40 MG PO CPDR: 40.0000 mg | DELAYED_RELEASE_CAPSULE | Freq: Every day | ORAL | Status: DC

## 2010-11-30 DIAGNOSIS — R739 Hyperglycemia, unspecified: Secondary | ICD-10-CM | POA: Insufficient documentation

## 2010-11-30 DIAGNOSIS — R1319 Other dysphagia: Secondary | ICD-10-CM | POA: Insufficient documentation

## 2010-11-30 DIAGNOSIS — R1310 Dysphagia, unspecified: Secondary | ICD-10-CM | POA: Insufficient documentation

## 2010-11-30 NOTE — Progress Notes (Signed)
  Subjective:    Patient ID: Samuel Willis, male    DOB: 11-Mar-1946, 65 y.o.   MRN: [id number]  HPI patient presents to clinic for evaluation of multiple medical problems. Notes history of solid food dysphagia with associated GERD and indigestion. Duration has been years and takes Pepcid. Denies associated abdominal pain or blood in stool. History of hyperlipidemia tolerating statin therapy without myalgia or known abnormal liver function tests. Previous glucoses have been mildly elevated such as 134 112 129. No personal history of diabetes but does have family history. History of atrial fibrillation attempting to avoid Coumadin therapy followed by cardiology. Denies palpitations or chest pain. Does have bicuspid aortic valve with mild aortic stenosis and aortic insufficiency. Exercises on a regular basis. Tetanus shot up-to-date 2010. Colonoscopy believed to have been performed 2009. Has not yet received Pneumovax. No other complaints.  Reviewed past medical history, past surgical history, medications, allergies, social history and family history.  Review of Systems  Constitutional: Negative for fever, chills and fatigue.  HENT: Positive for trouble swallowing. Negative for sore throat and facial swelling.   Eyes: Negative for discharge and redness.  Respiratory: Negative for cough and shortness of breath.   Cardiovascular: Negative for chest pain and palpitations.  Gastrointestinal: Negative for abdominal pain and blood in stool.  Genitourinary: Negative for dysuria, decreased urine volume and difficulty urinating.  Musculoskeletal: Negative for arthralgias and gait problem.  Skin: Negative for color change, pallor and rash.  Neurological: Negative for syncope, weakness and numbness.  Hematological: Negative for adenopathy. Does not bruise/bleed easily.  Psychiatric/Behavioral: Negative for behavioral problems and agitation. The patient is not nervous/anxious.        Objective:   Physical  Exam    Physical Exam  Vitals reviewed. Constitutional:  appears well-developed and well-nourished. No distress.  HENT:  Head: Normocephalic and atraumatic.  Right Ear: Tympanic membrane, external ear and ear canal normal.  Left Ear: Tympanic membrane, external ear and ear canal normal.  Nose: Nose normal.  Mouth/Throat: Oropharynx is clear and moist. No oropharyngeal exudate.  Eyes: Conjunctivae and EOM are normal. Pupils are equal, round, and reactive to light. Right eye exhibits no discharge. Left eye exhibits no discharge. No scleral icterus.  Neck: Neck supple. No thyromegaly present.  Cardiovascular: Normal rate, regular rhythm and normal heart sounds.  Exam reveals no gallop and no friction rub.   3/6 systolic murmur noted Pulmonary/Chest: Effort normal and breath sounds normal. No respiratory distress.  has no wheezes.  has no rales.  Abdomen: Soft, nondistended, nontender to palpation positive bowel sounds. No masses or organomegaly noted Lymphadenopathy:   no cervical adenopathy.  Neurological:  is alert.  Skin: Skin is warm and dry.  not diaphoretic.  Psychiatric: normal mood and affect.      Assessment & Plan:

## 2010-11-30 NOTE — Assessment & Plan Note (Signed)
Asymptomatic. Obtain Chem-7 and A1c 

## 2010-11-30 NOTE — Assessment & Plan Note (Signed)
Normotensive Continue current regimen  

## 2010-11-30 NOTE — Assessment & Plan Note (Signed)
Obtain fasting lipid profile and liver function tests. 

## 2010-11-30 NOTE — Assessment & Plan Note (Signed)
With associated chronic GERD for years. Begin PPI therapy. Schedule GI consultation for consideration of possible EGD.

## 2010-12-01 ENCOUNTER — Telehealth: Payer: Self-pay

## 2010-12-01 NOTE — Telephone Encounter (Signed)
Pt notified and verbalized understanding.

## 2010-12-01 NOTE — Telephone Encounter (Signed)
Message copied by Beverely Low on Mon Dec 01, 2010  3:30 PM ------      Message from: Staci Righter      Created: Sun Nov 30, 2010 11:33 AM       Blood sugar and sugar avg mildly elevated. At risk for diabetes. Low sugar/carb diet, exercise and wt loss. Will follow.      Other labs nl

## 2010-12-01 NOTE — Telephone Encounter (Signed)
Left message for pt to call back  °

## 2010-12-01 NOTE — Telephone Encounter (Signed)
Message copied by Beverely Low on Mon Dec 01, 2010  9:53 AM ------      Message from: Letitia Libra, Acie Fredrickson      Created: Sun Nov 30, 2010 11:33 AM       Blood sugar and sugar avg mildly elevated. At risk for diabetes. Low sugar/carb diet, exercise and wt loss. Will follow.      Other labs nl

## 2011-01-06 ENCOUNTER — Encounter: Payer: Self-pay | Admitting: Internal Medicine

## 2011-01-06 ENCOUNTER — Ambulatory Visit (INDEPENDENT_AMBULATORY_CARE_PROVIDER_SITE_OTHER): Payer: 59 | Admitting: Internal Medicine

## 2011-01-06 DIAGNOSIS — R1310 Dysphagia, unspecified: Secondary | ICD-10-CM

## 2011-01-06 DIAGNOSIS — R1314 Dysphagia, pharyngoesophageal phase: Secondary | ICD-10-CM

## 2011-01-06 DIAGNOSIS — K219 Gastro-esophageal reflux disease without esophagitis: Secondary | ICD-10-CM | POA: Insufficient documentation

## 2011-01-06 DIAGNOSIS — R1319 Other dysphagia: Secondary | ICD-10-CM

## 2011-01-06 NOTE — Patient Instructions (Signed)
You have been scheduled for an Endoscopy with separate instructions given.  

## 2011-01-06 NOTE — Assessment & Plan Note (Signed)
Chronic recurrent solid food dysphagia. Likely related to GERD and possible stricture versus motility disorder. Neoplasia and carcinoma is possible but much was likely given the chronicity and stability. Upper GI endoscopy with possible esophageal dilation is advised and he understands and agrees to proceed after discussion of risks benefits indications. Consider lowest dose of PPI or perhaps even an H2 blocker as he thinks that controlled his reflux before but he does have a stricture  a PPI probably makes the most sense.

## 2011-01-06 NOTE — Progress Notes (Signed)
  Subjective:    Patient ID: Samuel Willis, male    DOB: 1946-04-03, 65 y.o.   MRN: [id number]  HPI 65 yo wm sent here for dysphagia and GERD. He has a history of chronic acid reflux, had eaten a lot of Tums, etc. In June, Dr. Rodena Medin prescribed omeprazole and the patient also modified diet related to hyperglycemia and he is better and no heartburn. He ran out of the omeprazole last week and is still ok. Dysphagia is to solids, and intermittent and occurs weekly, with transient suprasternal sticking point. Drinking water allows the food to pass. GI review systems is otherwise negative.   Review of Systems Decreased hearing as noted all other systems negative or as per history of present illness    Objective:   Physical Exam  Constitutional: He is oriented to person, place, and time. He appears well-developed and well-nourished. No distress.  HENT:  Head: Normocephalic.  Mouth/Throat: No oropharyngeal exudate.  Eyes: Conjunctivae are normal. No scleral icterus.  Neck: Normal range of motion. Neck supple.  Cardiovascular: Normal rate and regular rhythm.  Exam reveals no gallop.   Murmur heard.      2/6 systolic ejection murmur  Pulmonary/Chest: Effort normal and breath sounds normal. He has no rales.  Abdominal: Soft. Bowel sounds are normal. He exhibits no distension and no mass. There is no tenderness.       No hepatosplenomegaly  Musculoskeletal: He exhibits no edema.  Lymphadenopathy:    He has no cervical adenopathy.  Neurological: He is alert and oriented to person, place, and time.  Psychiatric: He has a normal mood and affect. His behavior is normal.          Assessment & Plan:

## 2011-01-06 NOTE — Assessment & Plan Note (Signed)
He is a very good story for chronic reflux problems. They're currently controlled on a PPI though he ran out last week. Plan for EGD to screen for Barrett's esophagus. We'll determine best treatment plan after that. It sounds like he would be somebody who would do well with a chronic PPI given the dysphagia but will try to find the lowest dose and low strength medication available to reduce the possibility of long-term side effects, again it depends upon what is seen of the EGD I think.

## 2011-02-09 ENCOUNTER — Encounter: Payer: 59 | Admitting: Internal Medicine

## 2011-02-23 ENCOUNTER — Encounter: Payer: Self-pay | Admitting: Internal Medicine

## 2011-02-23 ENCOUNTER — Ambulatory Visit (AMBULATORY_SURGERY_CENTER): Payer: 59 | Admitting: Internal Medicine

## 2011-02-23 VITALS — BP 135/73 | HR 47 | Temp 95.8°F | Resp 15 | Ht 71.0 in | Wt 203.0 lb

## 2011-02-23 DIAGNOSIS — K219 Gastro-esophageal reflux disease without esophagitis: Secondary | ICD-10-CM

## 2011-02-23 DIAGNOSIS — K2270 Barrett's esophagus without dysplasia: Secondary | ICD-10-CM | POA: Insufficient documentation

## 2011-02-23 DIAGNOSIS — K449 Diaphragmatic hernia without obstruction or gangrene: Secondary | ICD-10-CM

## 2011-02-23 DIAGNOSIS — R1314 Dysphagia, pharyngoesophageal phase: Secondary | ICD-10-CM

## 2011-02-23 DIAGNOSIS — R1310 Dysphagia, unspecified: Secondary | ICD-10-CM

## 2011-02-23 DIAGNOSIS — R1319 Other dysphagia: Secondary | ICD-10-CM

## 2011-02-23 HISTORY — PX: ESOPHAGOGASTRODUODENOSCOPY: SHX1529

## 2011-02-23 MED ORDER — SODIUM CHLORIDE 0.9 % IV SOLN: 500.0000 mL | INTRAVENOUS | Status: DC

## 2011-02-23 NOTE — Patient Instructions (Addendum)
There were changes in the esophagus that indicate possible Barrett's esophagus - a condition related to acid reflux. There was also a small hiatal hernia. Please stay on your omeprazole. I dilated the esophagus given the reported swallowing problems. Your diet should be clear liquids until 5 PM then  soft foods rest of day. Start normal consistency foods tomorrow.  I will send you a letter about the biopsy results and recommendations. Iva Boop, MD, [facility]  Green and blue discharge instructions  Reviewed with patient and care partner.  Handouts given for Barrett's esophagus, Hiatal hernia, esophagitis/stricture, post dilation diet.  Resume medications as you were taking them prior to your procedure.

## 2011-02-27 ENCOUNTER — Encounter: Payer: Self-pay | Admitting: Internal Medicine

## 2011-03-18 LAB — POCT CARDIAC MARKERS
CKMB, poc: 1.2
CKMB, poc: <1 — ABNORMAL LOW
CKMB, poc: <1 — ABNORMAL LOW
Myoglobin, poc: 43.3
Myoglobin, poc: 46.9
Myoglobin, poc: 50.9
Troponin i, poc: <0.05
Troponin i, poc: <0.05
Troponin i, poc: <0.05

## 2011-03-18 LAB — BASIC METABOLIC PANEL
BUN: 23
Chloride: 103
Creatinine, Ser: 1
GFR calc non Af Amer: >60
Glucose, Bld: 130 — ABNORMAL HIGH
Potassium: 3.8

## 2011-03-18 LAB — CBC
HCT: 44.9
Hemoglobin: 15.5
MCHC: 34.5
MCV: 90.1
Platelets: 201
RBC: 4.98
RDW: 11.7
WBC: 5.5

## 2011-03-18 LAB — DIFFERENTIAL
Basophils Absolute: 0
Basophils Relative: 1
Eosinophils Absolute: 0.3
Eosinophils Relative: 6 — ABNORMAL HIGH
Lymphocytes Relative: 36
Lymphs Abs: 2
Monocytes Absolute: 0.7
Monocytes Relative: 13 — ABNORMAL HIGH
Neutro Abs: 2.5
Neutrophils Relative %: 44

## 2011-03-18 LAB — APTT: aPTT: 28

## 2011-03-18 LAB — PROTIME-INR
INR: 1
Prothrombin Time: 13.3

## 2011-03-18 LAB — BASIC METABOLIC PANEL WITH GFR
CO2: 29
Calcium: 9.3
GFR calc Af Amer: >60
Sodium: 142

## 2011-03-18 LAB — D-DIMER, QUANTITATIVE: D-Dimer, Quant: <0.22

## 2011-05-21 ENCOUNTER — Encounter: Payer: Self-pay | Admitting: Internal Medicine

## 2011-05-21 ENCOUNTER — Ambulatory Visit (INDEPENDENT_AMBULATORY_CARE_PROVIDER_SITE_OTHER): Payer: 59 | Admitting: Internal Medicine

## 2011-05-21 VITALS — BP 110/70 | HR 70 | Temp 98.1°F | Resp 16 | Wt 211.0 lb

## 2011-05-21 DIAGNOSIS — M79603 Pain in arm, unspecified: Secondary | ICD-10-CM | POA: Insufficient documentation

## 2011-05-21 DIAGNOSIS — S43499A Other sprain of unspecified shoulder joint, initial encounter: Secondary | ICD-10-CM

## 2011-05-21 DIAGNOSIS — M79609 Pain in unspecified limb: Secondary | ICD-10-CM

## 2011-05-21 DIAGNOSIS — S46219A Strain of muscle, fascia and tendon of other parts of biceps, unspecified arm, initial encounter: Secondary | ICD-10-CM

## 2011-05-21 NOTE — Progress Notes (Signed)
  Subjective:    Patient ID: Samuel Willis, male    DOB: [birth year], 65 y.o.   MRN: [id number]  HPI Pt presents to clinic for evaluation of arm pain. Notes 2+wk h/o of right arm pain in the area of the biceps. States reached around a tree and felt pain in the area. Since that time has noted abnormal appearance to right biceps muscle. The area is tender. Took tylenol with some improvement. No other complaints.  Past Medical History  Diagnosis Date  . GERD (gastroesophageal reflux disease)   . Heart murmur   . Hyperlipidemia   . Hypertension   . Kidney stone   . Arrhythmia     atrial fibrillation  . Aortic aneurysm, thoracic   . Heart valve problem     aortic stenosis, aortic regurgitation (bicuspid aortic valve)  . CAD (coronary artery disease)    Past Surgical History  Procedure Date  . Esophagogastroduodenoscopy 02/23/2011    Barrett's esophagus, hiatal hernia - dilated for dysphagia    reports that he has quit smoking. He has never used smokeless tobacco. He reports that he does not drink alcohol or use illicit drugs. family history includes Arthritis in his mother; Asthma in his father; Diabetes in his mother; Emphysema in his father; Heart attack in his father; Heart disease in his father; and Stroke in his mother. No Known Allergies     Review of Systems see hpi     Objective:   Physical Exam  Nursing note and vitals reviewed. Constitutional: He appears well-developed and well-nourished. No distress.  HENT:  Head: Normocephalic and atraumatic.  Eyes: Conjunctivae are normal. No scleral icterus.  Musculoskeletal:       Moderate to severe tenderness at proximal biceps tendon insertion. Mildly abn appearance of right bicep compared to left.  Neurological: He is alert.  Skin: Skin is warm and dry. He is not diaphoretic.  Psychiatric: He has a normal mood and affect.          Assessment & Plan:

## 2011-05-21 NOTE — Assessment & Plan Note (Signed)
Concern for possible biceps tear. Recommend proceeding with orthopedic consult. Referral will be made. Instructed to make follow up appt for HTN/hyperlipidemia after this issue is addressed.

## 2011-05-22 ENCOUNTER — Ambulatory Visit (INDEPENDENT_AMBULATORY_CARE_PROVIDER_SITE_OTHER): Payer: 59 | Admitting: Family Medicine

## 2011-05-22 DIAGNOSIS — M79609 Pain in unspecified limb: Secondary | ICD-10-CM

## 2011-05-22 DIAGNOSIS — M79603 Pain in arm, unspecified: Secondary | ICD-10-CM

## 2011-05-22 NOTE — Patient Instructions (Signed)
You ruptured your proximal biceps tendon. Unless you're bothered by the appearance of this, I would not recommend surgery for this. Ice area 15 minutes at a time 3-4 times a day. Tylenol 500mg  - 1-2 tabs three times a day as needed for pain. Aleve 1-2 tabs twice a day with food as needed for pain and inflammation. Physical therapy is a consideration if you have significant weakness, otherwise continue working out at the gym. Follow up with me as needed.  You can find this back brace online - it's called [employer] All Back Support You can also try medical supply stores to see if they can order it - [facility] on [address].

## 2011-05-25 ENCOUNTER — Ambulatory Visit: Payer: 59 | Admitting: Internal Medicine

## 2011-05-25 ENCOUNTER — Encounter: Payer: Self-pay | Admitting: Family Medicine

## 2011-05-25 NOTE — Assessment & Plan Note (Signed)
2/2 proximal biceps tendon rupture.  Patient's strength is excellent for having sustained this injury and pain has been improving over the past week.  We discussed his options - generally not recommended to proceed with surgical intervention for a proximal tear (though it is for distal tear) unless patient is concerned about cosmetic appearance which he is not.  Discussed physical therapy which he declined at this time - I think this is reasonable given his strength is very good.  Icing, tylenol/aleve as needed.  Call if he would like to proceed with PT.  F/u prn.

## 2011-05-25 NOTE — Progress Notes (Signed)
Subjective:    Patient ID: Samuel Willis, male    DOB: 02-02-1946, 65 y.o.   MRN: [id number]  PCP: Dr. Rodena Medin  HPI 65 yo M here for right arm injury.  Patient reports he has had about 2-3 months of anterior right shoulder pain without an injury. States then on 11/30 he was tying a camera onto a tree when he felt a sharp pain in proximal-mid bicep. States he had some bruising but not much swelling Had been taking ibuprofen, tylenol since then as needed. Noticed over past week he has developed a 'popeye' deformity of his bicep. Feels like his strength is intact for the most part. Pain has been improving. No prior right shoulder injuries, surgeries. Right handed.  Past Medical History  Diagnosis Date  . GERD (gastroesophageal reflux disease)   . Heart murmur   . Hyperlipidemia   . Hypertension   . Kidney stone   . Arrhythmia     atrial fibrillation  . Aortic aneurysm, thoracic   . Heart valve problem     aortic stenosis, aortic regurgitation (bicuspid aortic valve)  . CAD (coronary artery disease)     Current Outpatient Prescriptions on File Prior to Visit  Medication Sig Dispense Refill  . Arginine POWD Take by mouth 2 (two) times daily.        . benazepril (LOTENSIN) 10 MG tablet Take 10 mg by mouth daily.        . benazepril-hydrochlorthiazide (LOTENSIN HCT) 20-12.5 MG per tablet       . Flaxseed, Linseed, (FLAX SEEDS PO) Take by mouth.        Marland Kitchen omeprazole (PRILOSEC) 40 MG capsule Take 1 capsule (40 mg total) by mouth daily.  30 capsule  11  . sildenafil (VIAGRA) 100 MG tablet Take 100 mg by mouth daily as needed.        . simvastatin (ZOCOR) 40 MG tablet         Past Surgical History  Procedure Date  . Esophagogastroduodenoscopy 02/23/2011    Barrett's esophagus, hiatal hernia - dilated for dysphagia    No Known Allergies  History   Social History  . Marital Status: Married    Spouse Name: N/A    Number of Children: 2  . Years of Education: N/A    Occupational History  . Self employed    Social History Main Topics  . Smoking status: Former Games developer  . Smokeless tobacco: Never Used  . Alcohol Use: No  . Drug Use: No  . Sexually Active: Not on file   Other Topics Concern  . Not on file   Social History Narrative   Multiple small businesses over time. 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 [employer] Engineering geologist.    Family History  Problem Relation Age of Onset  . Arthritis Mother   . Diabetes Mother   . Stroke Mother   . Heart attack Father   . Asthma Father   . Emphysema Father   . Heart disease Father   . Hyperlipidemia Neg Hx   . Hypertension Neg Hx     BP 150/84  Pulse 67  Temp(Src) 97.9 F (36.6 C) (Oral)  Ht 6' (1.829 m)  Wt 200 lb (90.719 kg)  BMI 27.12 kg/m2  Review of Systems See HPI.    Objective:   Physical Exam Gen: NAD  R shoulder: Obvious popeye deformity especially with resisted elbow flexion.  No ecchymoses, swelling noted. TTP at bicipital groove and proximal biceps.  No TTP distal biceps  tendon.  Negative hook of distal biceps. FROM elbow and shoulder.  Strength 5-/5 with elbow flexion but otherwise 5/5 all other motions elbow and shoulder. Negative Hawkins, Neers. + yergasons.  Mild pain with speeds. Negative crossover adduction. Negative Empty can and resisted internal/external rotation with 5/5 strength. Negative apprehension. NV intact distally.    Assessment & Plan:  1. Right shoulder pain - 2/2 proximal biceps tendon rupture.  Patient's strength is excellent for having sustained this injury and pain has been improving over the past week.  We discussed his options - generally not recommended to proceed with surgical intervention for a proximal tear (though it is for distal tear) unless patient is concerned about cosmetic appearance which he is not.  Discussed physical therapy which he declined at this time - I think this is reasonable given his strength is very good.  Icing, tylenol/aleve as  needed.  Call if he would like to proceed with PT.  F/u prn.

## 2011-06-30 ENCOUNTER — Telehealth: Payer: Self-pay | Admitting: Cardiology

## 2011-06-30 DIAGNOSIS — I350 Nonrheumatic aortic (valve) stenosis: Secondary | ICD-10-CM

## 2011-06-30 NOTE — Telephone Encounter (Signed)
Pt calling to set up fu appt, wants to know if any test need to be done, if so need order, wants to do same day

## 2011-06-30 NOTE — Telephone Encounter (Signed)
Fu call °patient °

## 2011-06-30 NOTE — Telephone Encounter (Signed)
LMTCB

## 2011-06-30 NOTE — Telephone Encounter (Signed)
Pt is due for echo and appt with Dr Shirlee Latch. Echo orders in EPIC. Please call pt.

## 2011-07-20 ENCOUNTER — Ambulatory Visit (HOSPITAL_COMMUNITY): Payer: Medicare Other | Attending: Cardiology | Admitting: Radiology

## 2011-07-20 DIAGNOSIS — I359 Nonrheumatic aortic valve disorder, unspecified: Secondary | ICD-10-CM | POA: Insufficient documentation

## 2011-07-20 DIAGNOSIS — E785 Hyperlipidemia, unspecified: Secondary | ICD-10-CM | POA: Diagnosis not present

## 2011-07-20 DIAGNOSIS — I10 Essential (primary) hypertension: Secondary | ICD-10-CM | POA: Diagnosis not present

## 2011-07-20 DIAGNOSIS — I350 Nonrheumatic aortic (valve) stenosis: Secondary | ICD-10-CM

## 2011-07-20 DIAGNOSIS — I4891 Unspecified atrial fibrillation: Secondary | ICD-10-CM | POA: Diagnosis not present

## 2011-07-24 ENCOUNTER — Ambulatory Visit (INDEPENDENT_AMBULATORY_CARE_PROVIDER_SITE_OTHER): Payer: Medicare Other | Admitting: Cardiology

## 2011-07-24 ENCOUNTER — Encounter: Payer: Self-pay | Admitting: Cardiology

## 2011-07-24 DIAGNOSIS — E785 Hyperlipidemia, unspecified: Secondary | ICD-10-CM

## 2011-07-24 DIAGNOSIS — Q231 Congenital insufficiency of aortic valve: Secondary | ICD-10-CM

## 2011-07-24 DIAGNOSIS — I2510 Atherosclerotic heart disease of native coronary artery without angina pectoris: Secondary | ICD-10-CM

## 2011-07-24 DIAGNOSIS — I359 Nonrheumatic aortic valve disorder, unspecified: Secondary | ICD-10-CM | POA: Diagnosis not present

## 2011-07-24 DIAGNOSIS — I10 Essential (primary) hypertension: Secondary | ICD-10-CM

## 2011-07-24 LAB — HEPATIC FUNCTION PANEL
ALT: 32 U/L (ref 0–53)
Bilirubin, Direct: 0 mg/dL (ref 0.0–0.3)
Total Bilirubin: 0.6 mg/dL (ref 0.3–1.2)

## 2011-07-24 LAB — LIPID PANEL
Cholesterol: 136 mg/dL (ref 0–200)
HDL: 47.5 mg/dL (ref >39.00)
LDL Cholesterol: 73 mg/dL (ref 0–99)
Triglycerides: 80 mg/dL (ref 0.0–149.0)
VLDL: 16 mg/dL (ref 0.0–40.0)

## 2011-07-24 LAB — BASIC METABOLIC PANEL
BUN: 24 mg/dL — ABNORMAL HIGH (ref 6–23)
Calcium: 9.1 mg/dL (ref 8.4–10.5)
Creatinine, Ser: 1.1 mg/dL (ref 0.4–1.5)
GFR: 73.73 mL/min (ref >60.00)

## 2011-07-24 MED ORDER — SILDENAFIL CITRATE 100 MG PO TABS: 100.0000 mg | ORAL_TABLET | Freq: Every day | ORAL | Status: DC | PRN

## 2011-07-24 MED ORDER — BENAZEPRIL-HYDROCHLOROTHIAZIDE 20-12.5 MG PO TABS: 2.0000 | ORAL_TABLET | Freq: Every day | ORAL | Status: DC

## 2011-07-24 MED ORDER — ASPIRIN 81 MG PO TBEC: 81.0000 mg | DELAYED_RELEASE_TABLET | Freq: Every day | ORAL | Status: DC

## 2011-07-24 NOTE — Patient Instructions (Signed)
Your physician has recommended you make the following change in your medication:  increase Benazepril/hctz Start taking Aspirin 81 mg daily  Your physician recommends that you have lab work today fasting cholesterol, liver, bmp  Your physician wants you to follow-up in:  1 year with Dr. Earlean Shawl will receive a reminder letter in the mail two months in advance. If you don't receive a letter, please call our office to schedule the follow-up appointment.  Your physician has requested that you have an echocardiogram. Echocardiography is a painless test that uses sound waves to create images of your heart. It provides your doctor with information about the size and shape of your heart and how well your heart's chambers and valves are working. This procedure takes approximately one hour. There are no restrictions for this procedure. February 2014  Your Dr. Also recommends you have an MRA of the thoracic aorta in 1 year.

## 2011-07-24 NOTE — Progress Notes (Signed)
PCP: Dr. Rodena Medin  66 yo with history of bicuspid aortic valve and mild AS/mild AR as well as rare paroxysmal atrial fibrillation presents for followup. Echo this month showed mild LV hypertrophy, EF 60-65%, bicuspid aortic valve with mild AS and mild AI.  MRA aorta a year ago showed aortic root 3.7 cm and ascending aorta 3.7 cm. Left heart cath 1 year ago showed a 60-70% mid LAD stenosis.  ETT-myoview done after this showed good exercise tolerance and no evidence of ischemia or infarction.   Samuel Willis has a history of rare paroxysmal atrial fibrillation.  He has had no tachypalpitations since I saw him last.  He has increased his benazepril/HCT to 40/25 with good control of his BP currently.  He has stable exertional dyspnea.  He is short of breath walking up hills and when he does aerobics classes with his wife.  No dyspnea with walking on flat ground.  No chest pain.  Main problem recently has been a biceps tendon rupture.  The pain has mostly resolved.  He has not been taking aspirin.   ECG: NSR, normal  Labs (12/10): creatinine 1.4  Labs (6/12): K 4.5, creatinine 1.2, LDL 66, HDL 48  Allergies (verified):  No Known Drug Allergies   Past Medical History:  1. Paroxysmal atrial fibrillation. Patient has a brief episode of irregular heart beating every 1-2 years. One was documented as atrial fibrillation. I have spoken to the patient about coumadin in the past. His CHADSVASC score is 2 now that CAD has been found. He has had no atrial fibrillation symptoms or documented atrial fibrillation for 1-2 years now.  2. Hypertension.  3. Hyperlipidemia.  4. CAD: Exercise treadmill Myoview done in January 2010. The patient exercised for 10 minutes and 46 seconds. He stopped due to fatigue. EF was 56%. There was normal perfusion with no evidence for ischemia or infarction. LHC (1/12): 60-70% mLAD stenosis.  ETT-myoview was then done to assess for ischemia in the LAD territory. Patient exercised 10', EF was  69% with no evidence for ischemia or infarction.   5. Aortic stenosis and aortic regurgitation (bicuspid aortic valve). The patient did have an echocardiogram done on June 26, 2008. EF was 65%. There were no regional wall motion abnormalities. There was moderate aortic regurgitation. There was mild aortic stenosis by mean gradient which was 12 mmHg. There was mild ascending aorta and aortic root dilation. There was mild mitral regurgitation, mild left atrial enlargement, and right atrial enlargement. TEE was done, confirming a functionally bicuspid aortic valve with fusion of the right and noncoronary cusps. There was mild aortic stenosis and moderate aortic insufficiency. The ascending aorta was mildly dilated at 3.9 cm. TTE (1/10) showed EF 60% with mild LVH, mild AS (mean gradient 12), mild to moderate AR, normal LV size. TTE (1/12) with EF 60%, mild LVH, moderate diastolic dysfunction, mild AS (mean gradient 11 mmHg), mild AI, normal RV. MRA chest (1/12) with 3.7 cm aortic root and ascending aorta. TTE (2/13): EF 60-65%, mild LVH, mild AS, mild AI, mild Samuel.  6. Biceps tendon rupture.   Family History:  The patient's mother has a history of stroke, also has questionable history of atrial fibrillation. The patient's father had an MI in his 7s.   Social History:  The patient is married, lives in [place], is a nonsmoker. He owns several businesses. He is not drinking alcohol. He works out with a Psychologist, educational a couple of days a week and gets a significant amount of  cardiovascular exercise.   Review of Systems  All systems reviewed and negative except as per HPI.   Current Outpatient Prescriptions  Medication Sig Dispense Refill  . Arginine POWD Take by mouth 2 (two) times daily.        Marland Kitchen atorvastatin (LIPITOR) 40 MG tablet       . Flaxseed, Linseed, (FLAX SEEDS PO) Take by mouth.        Marland Kitchen omeprazole (PRILOSEC) 40 MG capsule Take 1 capsule (40 mg total) by mouth daily.  30 capsule  11  .  sildenafil (VIAGRA) 100 MG tablet Take 1 tablet (100 mg total) by mouth daily as needed.  10 tablet  8  . aspirin 81 MG EC tablet Take 1 tablet (81 mg total) by mouth daily. Swallow whole.  30 tablet  12  . benazepril-hydrochlorthiazide (LOTENSIN HCT) 20-12.5 MG per tablet Take 2 tablets by mouth daily.  60 tablet  11    BP 128/70  Pulse 71  Resp 18  Wt 97.433 kg (214 lb 12.8 oz)  SpO2 98% General: NAD Neck: No JVD, no thyromegaly or thyroid nodule.  Lungs: Clear to auscultation bilaterally with normal respiratory effort. CV: Nondisplaced PMI.  Heart regular S1/S2, no S3/S4, 2/6 SEM RUSB (S2 heard clearly).  No peripheral edema.  No carotid bruit.  Normal pedal pulses.  Abdomen: Soft, nontender, no hepatosplenomegaly, no distention.   Neurologic: Alert and oriented x 3.  Psych: Normal affect. Extremities: No clubbing or cyanosis.

## 2011-07-26 DIAGNOSIS — I2510 Atherosclerotic heart disease of native coronary artery without angina pectoris: Secondary | ICD-10-CM | POA: Insufficient documentation

## 2011-07-26 NOTE — Assessment & Plan Note (Signed)
60-70% mid LAD stenosis on left heart cath in 1/12.  ETT-myoview at that time showed good exercise tolerance and no ischemia or infarction.  He has stable exertional dyspnea.  Continue ASA 81 mg daily, statin, ACEI.

## 2011-07-26 NOTE — Assessment & Plan Note (Signed)
Bicuspid aortic valve with mild AI and mild AS on echo this month. Continue monitoring.  MR angiogram a year ago showed mild root and ascending aorta dilation.  Will plan MRA again in 1/14.

## 2011-07-26 NOTE — Assessment & Plan Note (Signed)
Goal LDL < 70.  Check lipids/LFTs today.

## 2011-07-26 NOTE — Assessment & Plan Note (Signed)
BP is under good control on benazepril/HCT.  Continue current regimen.

## 2011-09-28 ENCOUNTER — Telehealth: Payer: Self-pay | Admitting: Internal Medicine

## 2011-09-28 MED ORDER — OMEPRAZOLE 40 MG PO CPDR: 40.0000 mg | DELAYED_RELEASE_CAPSULE | Freq: Every day | ORAL | Status: DC

## 2011-09-28 NOTE — Telephone Encounter (Signed)
Refill- omeprazole 40mg  cap dr. Zachery Conch one capsule by mouth every day. Qty 30 last fill 12.28.12

## 2011-09-28 NOTE — Telephone Encounter (Signed)
Rx refill sent to pharmacy. 

## 2011-09-30 ENCOUNTER — Other Ambulatory Visit: Payer: Self-pay | Admitting: Cardiology

## 2011-09-30 MED ORDER — ATORVASTATIN CALCIUM 40 MG PO TABS: 40.0000 mg | ORAL_TABLET | Freq: Every day | ORAL | Status: DC

## 2011-10-01 DIAGNOSIS — M25519 Pain in unspecified shoulder: Secondary | ICD-10-CM | POA: Diagnosis not present

## 2011-10-16 DIAGNOSIS — M19019 Primary osteoarthritis, unspecified shoulder: Secondary | ICD-10-CM | POA: Diagnosis not present

## 2011-10-19 ENCOUNTER — Ambulatory Visit (INDEPENDENT_AMBULATORY_CARE_PROVIDER_SITE_OTHER): Payer: Medicare Other | Admitting: Internal Medicine

## 2011-10-19 ENCOUNTER — Encounter: Payer: Self-pay | Admitting: Internal Medicine

## 2011-10-19 ENCOUNTER — Telehealth: Payer: Self-pay | Admitting: Internal Medicine

## 2011-10-19 VITALS — BP 124/90 | HR 57 | Temp 98.1°F | Resp 18 | Ht 71.0 in | Wt 212.0 lb

## 2011-10-19 DIAGNOSIS — N471 Phimosis: Secondary | ICD-10-CM | POA: Insufficient documentation

## 2011-10-19 DIAGNOSIS — E785 Hyperlipidemia, unspecified: Secondary | ICD-10-CM

## 2011-10-19 DIAGNOSIS — Z125 Encounter for screening for malignant neoplasm of prostate: Secondary | ICD-10-CM | POA: Diagnosis not present

## 2011-10-19 DIAGNOSIS — Z79899 Other long term (current) drug therapy: Secondary | ICD-10-CM | POA: Diagnosis not present

## 2011-10-19 DIAGNOSIS — N478 Other disorders of prepuce: Secondary | ICD-10-CM

## 2011-10-19 DIAGNOSIS — I10 Essential (primary) hypertension: Secondary | ICD-10-CM

## 2011-10-19 DIAGNOSIS — R739 Hyperglycemia, unspecified: Secondary | ICD-10-CM

## 2011-10-19 DIAGNOSIS — R7309 Other abnormal glucose: Secondary | ICD-10-CM | POA: Diagnosis not present

## 2011-10-19 LAB — CBC WITH DIFFERENTIAL/PLATELET
Basophils Absolute: 0 10*3/uL (ref 0.0–0.1)
Basophils Relative: 0 % (ref 0–1)
Eosinophils Absolute: 0.2 10*3/uL (ref 0.0–0.7)
Hemoglobin: 14.9 g/dL (ref 13.0–17.0)
MCH: 31.7 pg (ref 26.0–34.0)
MCHC: 34.7 g/dL (ref 30.0–36.0)
Monocytes Relative: 8 % (ref 3–12)
Neutrophils Relative %: 61 % (ref 43–77)
RDW: 12.1 % (ref 11.5–15.5)

## 2011-10-19 LAB — HEPATIC FUNCTION PANEL
ALT: 21 U/L (ref 0–53)
AST: 20 U/L (ref 0–37)
Bilirubin, Direct: 0.1 mg/dL (ref 0.0–0.3)
Indirect Bilirubin: 0.4 mg/dL (ref 0.0–0.9)

## 2011-10-19 LAB — BASIC METABOLIC PANEL
BUN: 28 mg/dL — ABNORMAL HIGH (ref 6–23)
Calcium: 9.5 mg/dL (ref 8.4–10.5)
Glucose, Bld: 136 mg/dL — ABNORMAL HIGH (ref 70–99)
Sodium: 139 mEq/L (ref 135–145)

## 2011-10-19 LAB — LIPID PANEL: Cholesterol: 119 mg/dL (ref 0–200)

## 2011-10-19 NOTE — Assessment & Plan Note (Signed)
Obtain chem7 and a1c 

## 2011-10-19 NOTE — Progress Notes (Signed)
  Subjective:    Patient ID: Samuel Willis, male    DOB: 04-Dec-1945, 66 y.o.   MRN: [id number]  HPI Pt presents to clinic for annual exam. Has chronic right shoulder pain now seeing orthopedics. Has decreased ROM. Continues to follow with cardiology due to h/o AI and afib. Note intermittent difficulty retracting penis foreskin and asks about possible circumscion.   Past Medical History  Diagnosis Date  . GERD (gastroesophageal reflux disease)   . Heart murmur   . Hyperlipidemia   . Hypertension   . Kidney stone   . Arrhythmia     atrial fibrillation  . Aortic aneurysm, thoracic   . Heart valve problem     aortic stenosis, aortic regurgitation (bicuspid aortic valve)  . CAD (coronary artery disease)    Past Surgical History  Procedure Date  . Esophagogastroduodenoscopy 02/23/2011    Barrett's esophagus, hiatal hernia - dilated for dysphagia    reports that he has quit smoking. He has never used smokeless tobacco. He reports that he does not drink alcohol or use illicit drugs. family history includes Arthritis in his mother; Asthma in his father; Diabetes in his mother; Emphysema in his father; Heart attack in his father; Heart disease in his father; and Stroke in his mother.  There is no history of Hyperlipidemia and Hypertension. No Known Allergies    Review of Systems see hpi     Objective:   Physical Exam  Physical Exam  Nursing note and vitals reviewed. Constitutional: He appears well-developed and well-nourished. No distress.  HENT:  Head: Normocephalic and atraumatic.  Right Ear: Tympanic membrane and external ear normal.  Left Ear: Tympanic membrane and external ear normal.  Nose: Nose normal.  Mouth/Throat: Uvula is midline, oropharynx is clear and moist and mucous membranes are normal. No oropharyngeal exudate.  Eyes: Conjunctivae and EOM are normal. Pupils are equal, round, and reactive to light. Right eye exhibits no discharge. Left eye exhibits no discharge. No  scleral icterus.  Neck: Neck supple. Carotid bruit is not present. No thyromegaly present.  Cardiovascular: Normal rate, regular rhythm and normal heart sounds.  Exam reveals no gallop and no friction rub.   3/6 SM best heard RSB Pulmonary/Chest: Effort normal and breath sounds normal. No respiratory distress. He has no wheezes. He has no rales.  Abdominal: Soft. He exhibits no distension and no mass. There is no hepatosplenomegaly. There is no tenderness. There is no rebound. Hernia confirmed negative in the right inguinal area and confirmed negative in the left inguinal area.  Genitourinary:penis nl. No difficulty retracting foreskin. No hernia on valsalva. Lymphadenopathy:    He has no cervical adenopathy.       Right: No inguinal adenopathy present.       Left: No inguinal adenopathy present.  Neurological: He is alert.  Skin: Skin is warm and dry. He is not diaphoretic.  Psychiatric: He has a normal mood and affect.        Assessment & Plan:

## 2011-10-19 NOTE — Assessment & Plan Note (Signed)
Obtain lipid/lft. 

## 2011-10-19 NOTE — Assessment & Plan Note (Signed)
Currently asx but continues to be intermittent problem. Proceed with urology consult

## 2011-10-19 NOTE — Telephone Encounter (Signed)
Lab order entered for October 2013. 

## 2011-10-19 NOTE — Patient Instructions (Signed)
Please schedule fasting labs prior to next visit chem7-v58.69 and lipid/lft-272.4 

## 2011-10-27 ENCOUNTER — Other Ambulatory Visit: Payer: Self-pay | Admitting: Internal Medicine

## 2011-10-27 DIAGNOSIS — R739 Hyperglycemia, unspecified: Secondary | ICD-10-CM

## 2011-11-02 ENCOUNTER — Telehealth: Payer: Self-pay | Admitting: Internal Medicine

## 2011-11-02 DIAGNOSIS — S6990XA Unspecified injury of unspecified wrist, hand and finger(s), initial encounter: Secondary | ICD-10-CM | POA: Diagnosis not present

## 2011-11-02 DIAGNOSIS — S61409A Unspecified open wound of unspecified hand, initial encounter: Secondary | ICD-10-CM | POA: Diagnosis not present

## 2011-11-02 DIAGNOSIS — W268XXA Contact with other sharp object(s), not elsewhere classified, initial encounter: Secondary | ICD-10-CM | POA: Diagnosis not present

## 2011-11-02 NOTE — Telephone Encounter (Signed)
Patients wife August Saucer called stating that patient is in an ED in [place] right now. He cut a tendon in his hand and will need a referral to an orthopedic surgeon. Patient would like an appointment this week with surgeon.

## 2011-11-03 ENCOUNTER — Other Ambulatory Visit: Payer: Self-pay | Admitting: Internal Medicine

## 2011-11-03 DIAGNOSIS — IMO0002 Reserved for concepts with insufficient information to code with codable children: Secondary | ICD-10-CM

## 2011-11-04 ENCOUNTER — Other Ambulatory Visit: Payer: Self-pay | Admitting: Orthopedic Surgery

## 2011-11-04 ENCOUNTER — Encounter (HOSPITAL_BASED_OUTPATIENT_CLINIC_OR_DEPARTMENT_OTHER)
Admission: RE | Admit: 2011-11-04 | Discharge: 2011-11-04 | Disposition: A | Discharge status: Home / Self Care | Payer: Medicare Other | Source: Ambulatory Visit | Attending: Orthopedic Surgery | Admitting: Orthopedic Surgery

## 2011-11-04 ENCOUNTER — Encounter (HOSPITAL_BASED_OUTPATIENT_CLINIC_OR_DEPARTMENT_OTHER): Payer: Self-pay | Admitting: *Deleted

## 2011-11-04 DIAGNOSIS — I499 Cardiac arrhythmia, unspecified: Secondary | ICD-10-CM | POA: Diagnosis not present

## 2011-11-04 DIAGNOSIS — I739 Peripheral vascular disease, unspecified: Secondary | ICD-10-CM | POA: Diagnosis not present

## 2011-11-04 DIAGNOSIS — I2510 Atherosclerotic heart disease of native coronary artery without angina pectoris: Secondary | ICD-10-CM | POA: Diagnosis not present

## 2011-11-04 DIAGNOSIS — E785 Hyperlipidemia, unspecified: Secondary | ICD-10-CM | POA: Diagnosis not present

## 2011-11-04 DIAGNOSIS — S61409A Unspecified open wound of unspecified hand, initial encounter: Secondary | ICD-10-CM | POA: Diagnosis not present

## 2011-11-04 DIAGNOSIS — I10 Essential (primary) hypertension: Secondary | ICD-10-CM | POA: Diagnosis not present

## 2011-11-04 DIAGNOSIS — I712 Thoracic aortic aneurysm, without rupture: Secondary | ICD-10-CM | POA: Diagnosis not present

## 2011-11-04 DIAGNOSIS — K219 Gastro-esophageal reflux disease without esophagitis: Secondary | ICD-10-CM | POA: Diagnosis not present

## 2011-11-04 LAB — BASIC METABOLIC PANEL
BUN: 33 mg/dL — ABNORMAL HIGH (ref 6–23)
Chloride: 103 mEq/L (ref 96–112)
Creatinine, Ser: 1.18 mg/dL (ref 0.50–1.35)
GFR calc Af Amer: 73 mL/min — ABNORMAL LOW (ref >90)
Glucose, Bld: 93 mg/dL (ref 70–99)
Potassium: 5 mEq/L (ref 3.5–5.1)

## 2011-11-04 NOTE — Progress Notes (Signed)
Dr. Sampson Goon of pt's BUN 33 -- Ok for surgery

## 2011-11-04 NOTE — Progress Notes (Signed)
Pt came in add on-bmet done-has cardiac hx-will review hx and review w anesthesia if needed

## 2011-11-05 NOTE — H&P (Signed)
Samuel Willis is an 66 y.o. male.   Chief Complaint: c/o laceration to dorsum of the left hand HPI: Samuel Willis is a 66 year-old right-hand dominant gentleman who presents for evaluation of a probable extensor tendon injury to the left dorsal hand.  He was working with a lawnmower blade and sustained a deep laceration to the dorsal aspect of his left hand on Monday, 11/02/11.  He was seen at the hospital emergency department in [place], [place].  X-rays of the hand revealed no fracture or radiopaque foreign body.  His wound was cleaned and sutured closed. He was provided tetanus prophylaxis and given Keflex 500 mg. PO q6h for prophylactic antibiotic.  Past Medical History  Diagnosis Date  . GERD (gastroesophageal reflux disease)   . Heart murmur   . Hyperlipidemia   . Hypertension   . Kidney stone   . Arrhythmia     atrial fibrillation  . Aortic aneurysm, thoracic   . Heart valve problem     aortic stenosis, aortic regurgitation (bicuspid aortic valve)  . CAD (coronary artery disease)     Past Surgical History  Procedure Date  . Esophagogastroduodenoscopy 02/23/2011    Barrett's esophagus, hiatal hernia - dilated for dysphagia  . Colonoscopy     Family History  Problem Relation Age of Onset  . Arthritis Mother   . Diabetes Mother   . Stroke Mother   . Heart attack Father   . Asthma Father   . Emphysema Father   . Heart disease Father   . Hyperlipidemia Neg Hx   . Hypertension Neg Hx    Social History:  reports that he quit smoking about 18 years ago. He has never used smokeless tobacco. He reports that he does not drink alcohol or use illicit drugs.  Allergies: No Known Allergies  No prescriptions prior to admission    Results for orders placed during the hospital encounter of 11/06/11 (from the past 48 hour(s))  BASIC METABOLIC PANEL     Status: Abnormal   Collection Time   11/04/11  9:50 AM      Component Value Range Comment   Sodium 138  135 - 145 (mEq/L)      Potassium 5.0  3.5 - 5.1 (mEq/L)    Chloride 103  96 - 112 (mEq/L)    CO2 28  19 - 32 (mEq/L)    Glucose, Bld 93  70 - 99 (mg/dL)    BUN 33 (*) 6 - 23 (mg/dL)    Creatinine, Ser 4.54  0.50 - 1.35 (mg/dL)    Calcium 9.6  8.4 - 10.5 (mg/dL)    GFR calc non Af Amer 63 (*) >90 (mL/min)    GFR calc Af Amer 73 (*) >90 (mL/min)     No results found.   Pertinent items are noted in HPI.  Height 5\' 11"  (1.803 m), weight 92.987 kg (205 lb).  General appearance: alert Head: Normocephalic, without obvious abnormality Neck: supple, symmetrical, trachea midline Resp: clear to auscultation bilaterally Cardio: regular rate and rhythm GI: normal findings: bowel sounds normal Extremities: He has full range of motion of his elbow, forearm and wrist, but has an extensor lag of 50 degrees at the MP joint of his small finger. He is able to fully extend the index, long and ring finger MP joints. His intrinsic function appears to be intact.  His motor and sensory examination is intact except for loss of sensibility distal to the laceration.  There is no sign of infection.  There is no lymphangitis or lymphadenopathy.  X-rays were obtained and reportedly negative.  He did not bring his disk with him Pulses: 2+ and symmetric Skin: normal Neurologic: Grossly normal    Assessment/Plan ASSESSMENT:   Laceration extensor digiti minimi and extensor digitorum longus left small finger.   PLAN:  We will schedule him for elective exploration of his wound and delayed primary repair of his extensor tendon laceration at the [facility] on 11/06/11.    Questions regarding his care were invited and answered in detail.  He will need to be in a splint for three weeks post-op followed by dynamic splinting to allow early range of motion.   DASNOIT,Yitzel Shasteen J 11/05/2011, 2:34 PM    H&P documentation: 11/06/2011  -History and Physical Reviewed  -Patient has been re-examined  -No change in the plan of  care  Wyn Forster, MD    H&P documentation: 11/06/2011  -History and Physical Reviewed  -Patient has been re-examined  -No change in the plan of care  Wyn Forster, MD

## 2011-11-06 ENCOUNTER — Encounter (HOSPITAL_BASED_OUTPATIENT_CLINIC_OR_DEPARTMENT_OTHER): Payer: Self-pay | Admitting: Certified Registered Nurse Anesthetist

## 2011-11-06 ENCOUNTER — Ambulatory Visit (HOSPITAL_BASED_OUTPATIENT_CLINIC_OR_DEPARTMENT_OTHER)
Admission: RE | Admit: 2011-11-06 | Discharge: 2011-11-06 | Disposition: A | Discharge status: Home / Self Care | Payer: Medicare Other | Source: Ambulatory Visit | Attending: Orthopedic Surgery | Admitting: Orthopedic Surgery

## 2011-11-06 ENCOUNTER — Encounter (HOSPITAL_BASED_OUTPATIENT_CLINIC_OR_DEPARTMENT_OTHER): Payer: Self-pay | Admitting: *Deleted

## 2011-11-06 ENCOUNTER — Encounter (HOSPITAL_BASED_OUTPATIENT_CLINIC_OR_DEPARTMENT_OTHER)
Admission: RE | Disposition: A | Discharge status: Home / Self Care | Payer: Self-pay | Source: Ambulatory Visit | Attending: Orthopedic Surgery

## 2011-11-06 ENCOUNTER — Ambulatory Visit (HOSPITAL_BASED_OUTPATIENT_CLINIC_OR_DEPARTMENT_OTHER): Payer: Medicare Other | Admitting: Certified Registered Nurse Anesthetist

## 2011-11-06 DIAGNOSIS — E785 Hyperlipidemia, unspecified: Secondary | ICD-10-CM | POA: Insufficient documentation

## 2011-11-06 DIAGNOSIS — K219 Gastro-esophageal reflux disease without esophagitis: Secondary | ICD-10-CM | POA: Insufficient documentation

## 2011-11-06 DIAGNOSIS — I10 Essential (primary) hypertension: Secondary | ICD-10-CM | POA: Insufficient documentation

## 2011-11-06 DIAGNOSIS — I712 Thoracic aortic aneurysm, without rupture, unspecified: Secondary | ICD-10-CM | POA: Insufficient documentation

## 2011-11-06 DIAGNOSIS — S61409A Unspecified open wound of unspecified hand, initial encounter: Secondary | ICD-10-CM | POA: Diagnosis not present

## 2011-11-06 DIAGNOSIS — I499 Cardiac arrhythmia, unspecified: Secondary | ICD-10-CM | POA: Insufficient documentation

## 2011-11-06 DIAGNOSIS — I2510 Atherosclerotic heart disease of native coronary artery without angina pectoris: Secondary | ICD-10-CM | POA: Diagnosis not present

## 2011-11-06 DIAGNOSIS — Y93H9 Activity, other involving exterior property and land maintenance, building and construction: Secondary | ICD-10-CM | POA: Insufficient documentation

## 2011-11-06 DIAGNOSIS — S66909A Unspecified injury of unspecified muscle, fascia and tendon at wrist and hand level, unspecified hand, initial encounter: Secondary | ICD-10-CM | POA: Diagnosis not present

## 2011-11-06 DIAGNOSIS — S6990XA Unspecified injury of unspecified wrist, hand and finger(s), initial encounter: Secondary | ICD-10-CM | POA: Diagnosis not present

## 2011-11-06 DIAGNOSIS — Y92009 Unspecified place in unspecified non-institutional (private) residence as the place of occurrence of the external cause: Secondary | ICD-10-CM | POA: Insufficient documentation

## 2011-11-06 DIAGNOSIS — W28XXXA Contact with powered lawn mower, initial encounter: Secondary | ICD-10-CM | POA: Insufficient documentation

## 2011-11-06 DIAGNOSIS — I739 Peripheral vascular disease, unspecified: Secondary | ICD-10-CM | POA: Insufficient documentation

## 2011-11-06 DIAGNOSIS — Y998 Other external cause status: Secondary | ICD-10-CM | POA: Insufficient documentation

## 2011-11-06 HISTORY — PX: TENDON REPAIR: SHX5111

## 2011-11-06 LAB — POCT HEMOGLOBIN-HEMACUE: Hemoglobin: 14.6 g/dL (ref 13.0–17.0)

## 2011-11-06 SURGERY — TENDON REPAIR
Anesthesia: General | Site: Hand | Laterality: Left | Wound class: Clean

## 2011-11-06 MED ORDER — METOCLOPRAMIDE HCL 5 MG/ML IJ SOLN: 10.0000 mg | Freq: Once | INTRAMUSCULAR | Status: DC | PRN

## 2011-11-06 MED ORDER — FENTANYL CITRATE 0.05 MG/ML IJ SOLN: 25.0000 ug | INTRAMUSCULAR | Status: DC | PRN

## 2011-11-06 MED ORDER — CEPHALEXIN 500 MG PO CAPS: 500.0000 mg | ORAL_CAPSULE | Freq: Three times a day (TID) | ORAL | Status: AC

## 2011-11-06 MED ORDER — DEXAMETHASONE SODIUM PHOSPHATE 10 MG/ML IJ SOLN
INTRAMUSCULAR | Status: DC | PRN
  Administered 2011-11-06: 10 mg via INTRAVENOUS

## 2011-11-06 MED ORDER — MIDAZOLAM HCL 2 MG/2ML IJ SOLN: 0.5000 mg | INTRAMUSCULAR | Status: DC | PRN

## 2011-11-06 MED ORDER — LIDOCAINE HCL (CARDIAC) 20 MG/ML IV SOLN
INTRAVENOUS | Status: DC | PRN
  Administered 2011-11-06: 60 mg via INTRAVENOUS

## 2011-11-06 MED ORDER — CEFAZOLIN SODIUM 1-5 GM-% IV SOLN
INTRAVENOUS | Status: DC | PRN
  Administered 2011-11-06: 2 g via INTRAVENOUS

## 2011-11-06 MED ORDER — EPHEDRINE SULFATE 50 MG/ML IJ SOLN
INTRAMUSCULAR | Status: DC | PRN
  Administered 2011-11-06 (×2): 10 mg via INTRAVENOUS

## 2011-11-06 MED ORDER — HYDROCODONE-ACETAMINOPHEN 5-325 MG PO TABS: ORAL_TABLET | ORAL | Status: AC

## 2011-11-06 MED ORDER — METOCLOPRAMIDE HCL 5 MG/ML IJ SOLN
INTRAMUSCULAR | Status: DC | PRN
  Administered 2011-11-06: 10 mg via INTRAVENOUS

## 2011-11-06 MED ORDER — FENTANYL CITRATE 0.05 MG/ML IJ SOLN: 50.0000 ug | INTRAMUSCULAR | Status: DC | PRN

## 2011-11-06 MED ORDER — CHLORHEXIDINE GLUCONATE 4 % EX LIQD: 60.0000 mL | Freq: Once | CUTANEOUS | Status: DC

## 2011-11-06 MED ORDER — LACTATED RINGERS IV SOLN
INTRAVENOUS | Status: DC
  Administered 2011-11-06 (×2): via INTRAVENOUS

## 2011-11-06 MED ORDER — PROPOFOL 10 MG/ML IV EMUL
INTRAVENOUS | Status: DC | PRN
  Administered 2011-11-06: 200 mg via INTRAVENOUS

## 2011-11-06 MED ORDER — ONDANSETRON HCL 4 MG/2ML IJ SOLN
INTRAMUSCULAR | Status: DC | PRN
  Administered 2011-11-06: 4 mg via INTRAVENOUS

## 2011-11-06 MED ORDER — LIDOCAINE HCL 2 % IJ SOLN
INTRAMUSCULAR | Status: DC | PRN
  Administered 2011-11-06: 6 mL

## 2011-11-06 MED ORDER — FENTANYL CITRATE 0.05 MG/ML IJ SOLN
INTRAMUSCULAR | Status: DC | PRN
  Administered 2011-11-06: 50 ug via INTRAVENOUS

## 2011-11-06 SURGICAL SUPPLY — 82 items
BANDAGE ADHESIVE 1X3 (GAUZE/BANDAGES/DRESSINGS) IMPLANT
BANDAGE ELASTIC 3 VELCRO ST LF (GAUZE/BANDAGES/DRESSINGS) ×2 IMPLANT
BANDAGE GAUZE ELAST BULKY 4 IN (GAUZE/BANDAGES/DRESSINGS) ×2 IMPLANT
BLADE MINI RND TIP GREEN BEAV (BLADE) IMPLANT
BLADE SURG 10 STRL SS (BLADE) ×2 IMPLANT
BLADE SURG 15 STRL LF DISP TIS (BLADE) ×1 IMPLANT
BLADE SURG 15 STRL SS (BLADE) ×1
BNDG COHESIVE 1X5 TAN STRL LF (GAUZE/BANDAGES/DRESSINGS) IMPLANT
BNDG ELASTIC 2 VLCR STRL LF (GAUZE/BANDAGES/DRESSINGS) ×2 IMPLANT
BNDG ESMARK 4X9 LF (GAUZE/BANDAGES/DRESSINGS) IMPLANT
BRUSH SCRUB EZ PLAIN DRY (MISCELLANEOUS) ×2 IMPLANT
CATH ROBINSON RED A/P 10FR (CATHETERS) IMPLANT
CATH ROBINSON RED A/P 12FR (CATHETERS) IMPLANT
CLOTH BEACON ORANGE TIMEOUT ST (SAFETY) ×2 IMPLANT
CORDS BIPOLAR (ELECTRODE) ×2 IMPLANT
COVER MAYO STAND STRL (DRAPES) ×2 IMPLANT
COVER TABLE BACK 60X90 (DRAPES) ×2 IMPLANT
CUFF TOURNIQUET SINGLE 18IN (TOURNIQUET CUFF) ×2 IMPLANT
DECANTER SPIKE VIAL GLASS SM (MISCELLANEOUS) IMPLANT
DRAIN PENROSE 1/4X12 LTX STRL (WOUND CARE) IMPLANT
DRAPE EXTREMITY T 121X128X90 (DRAPE) ×2 IMPLANT
DRAPE SURG 17X23 STRL (DRAPES) ×2 IMPLANT
GAUZE SPONGE 4X4 16PLY XRAY LF (GAUZE/BANDAGES/DRESSINGS) IMPLANT
GAUZE XEROFORM 1X8 LF (GAUZE/BANDAGES/DRESSINGS) IMPLANT
GLOVE BIO SURGEON STRL SZ 6 (GLOVE) ×2 IMPLANT
GLOVE BIOGEL M STRL SZ7.5 (GLOVE) ×2 IMPLANT
GLOVE ECLIPSE 6.5 STRL STRAW (GLOVE) ×2 IMPLANT
GLOVE ECLIPSE 8.5 STRL (GLOVE) IMPLANT
GLOVE INDICATOR 6.5 STRL GRN (GLOVE) ×2 IMPLANT
GLOVE ORTHO TXT STRL SZ7.5 (GLOVE) ×2 IMPLANT
GLOVE SKINSENSE NS SZ7.0 (GLOVE) ×1
GLOVE SKINSENSE STRL SZ7.0 (GLOVE) ×1 IMPLANT
GOWN PREVENTION PLUS XLARGE (GOWN DISPOSABLE) IMPLANT
GOWN PREVENTION PLUS XXLARGE (GOWN DISPOSABLE) IMPLANT
NEEDLE 27GAX1X1/2 (NEEDLE) ×2 IMPLANT
NEEDLE ADDISON D1/2 CIR (NEEDLE) IMPLANT
NEEDLE HYPO 25X1 1.5 SAFETY (NEEDLE) IMPLANT
NEEDLE KEITH (NEEDLE) IMPLANT
NEEDLE KEITH SZ10 STRAIGHT (NEEDLE) IMPLANT
NS IRRIG 1000ML POUR BTL (IV SOLUTION) ×2 IMPLANT
PACK BASIN DAY SURGERY FS (CUSTOM PROCEDURE TRAY) ×2 IMPLANT
PAD CAST 3X4 CTTN HI CHSV (CAST SUPPLIES) ×1 IMPLANT
PADDING CAST ABS 3INX4YD NS (CAST SUPPLIES)
PADDING CAST ABS 4INX4YD NS (CAST SUPPLIES) ×1
PADDING CAST ABS COTTON 3X4 (CAST SUPPLIES) IMPLANT
PADDING CAST ABS COTTON 4X4 ST (CAST SUPPLIES) ×1 IMPLANT
PADDING CAST COTTON 3X4 STRL (CAST SUPPLIES) ×1
PASSER SUT SWANSON 36MM LOOP (INSTRUMENTS) ×2 IMPLANT
SLEEVE SCD COMPRESS KNEE MED (MISCELLANEOUS) ×2 IMPLANT
SPLINT PLASTER CAST XFAST 3X15 (CAST SUPPLIES) ×10 IMPLANT
SPLINT PLASTER XTRA FASTSET 3X (CAST SUPPLIES) ×10
SPONGE GAUZE 4X4 12PLY (GAUZE/BANDAGES/DRESSINGS) ×2 IMPLANT
STOCKINETTE 4X48 STRL (DRAPES) ×2 IMPLANT
STOCKINETTE 6  STRL (DRAPES)
STOCKINETTE 6 STRL (DRAPES) IMPLANT
STRIP CLOSURE SKIN 1/2X4 (GAUZE/BANDAGES/DRESSINGS) ×2 IMPLANT
SUT ETHIBOND 3-0 V-5 (SUTURE) ×4 IMPLANT
SUT ETHILON 5 0 P 3 18 (SUTURE)
SUT FIBERWIRE 3-0 18 TAPR NDL (SUTURE)
SUT FIBERWIRE 4-0 18 TAPR NDL (SUTURE)
SUT MERSILENE 4 0 P 3 (SUTURE) ×2 IMPLANT
SUT MERSILENE 6 0 P 1 (SUTURE) IMPLANT
SUT MERSILENE 6 0 S14 DA (SUTURE) IMPLANT
SUT NYLON ETHILON 5-0 P-3 1X18 (SUTURE) IMPLANT
SUT POLY BUTTON 15MM (SUTURE) IMPLANT
SUT PROLENE 2 0 SH DA (SUTURE) IMPLANT
SUT PROLENE 3 0 PS 2 (SUTURE) ×2 IMPLANT
SUT PROLENE 4 0 PS 2 18 (SUTURE) IMPLANT
SUT SILK 4 0 PS 2 (SUTURE) IMPLANT
SUT VIC AB 4-0 SH 27 (SUTURE) ×1
SUT VIC AB 4-0 SH 27XANBCTRL (SUTURE) ×1 IMPLANT
SUTURE FIBERWR 3-0 18 TAPR NDL (SUTURE) IMPLANT
SUTURE FIBERWR 4-0 18 TAPR NDL (SUTURE) IMPLANT
SYR 3ML 23GX1 SAFETY (SYRINGE) IMPLANT
SYR 5ML LL (SYRINGE) IMPLANT
SYR BULB 3OZ (MISCELLANEOUS) ×2 IMPLANT
SYR CONTROL 10ML LL (SYRINGE) ×2 IMPLANT
TOWEL OR 17X24 6PK STRL BLUE (TOWEL DISPOSABLE) ×2 IMPLANT
TRAY DSU PREP LF (CUSTOM PROCEDURE TRAY) ×2 IMPLANT
TUBE FEEDING 5FR 15 INCH (TUBING) IMPLANT
UNDERPAD 30X30 INCONTINENT (UNDERPADS AND DIAPERS) ×2 IMPLANT
WATER STERILE IRR 1000ML POUR (IV SOLUTION) ×2 IMPLANT

## 2011-11-06 NOTE — Discharge Instructions (Signed)

## 2011-11-06 NOTE — Anesthesia Postprocedure Evaluation (Signed)
Anesthesia Post Note  Patient: Samuel Willis  Procedure(s) Performed: Procedure(s) (LRB): TENDON REPAIR (Left)  Anesthesia type: General  Patient location: PACU  Post pain: Pain level controlled  Post assessment: Patient's Cardiovascular Status Stable  Last Vitals:  Filed Vitals:   11/06/11 1330  BP:   Pulse: 59  Temp:   Resp: 11    Post vital signs: Reviewed and stable  Level of consciousness: alert  Complications: No apparent anesthesia complications

## 2011-11-06 NOTE — Anesthesia Preprocedure Evaluation (Addendum)
Anesthesia Evaluation  Patient identified by MRN, date of birth, ID band Patient awake    Reviewed: Allergy & Precautions, H&P , NPO status , Patient's Chart, lab work & pertinent test results, reviewed documented beta blocker date and time   Airway Mallampati: II TM Distance: >3 FB Neck ROM: full    Dental   Pulmonary neg pulmonary ROS,          Cardiovascular hypertension, On Medications + CAD and + Peripheral Vascular Disease + dysrhythmias + Valvular Problems/Murmurs AI     Neuro/Psych negative neurological ROS  negative psych ROS   GI/Hepatic Neg liver ROS, GERD-  Medicated and Controlled,  Endo/Other  negative endocrine ROS  Renal/GU negative Renal ROS  negative genitourinary   Musculoskeletal   Abdominal   Peds  Hematology negative hematology ROS (+)   Anesthesia Other Findings See surgeon's H&P   Reproductive/Obstetrics negative OB ROS                           Anesthesia Physical Anesthesia Plan  ASA: III  Anesthesia Plan: General   Post-op Pain Management:    Induction: Intravenous  Airway Management Planned: LMA  Additional Equipment:   Intra-op Plan:   Post-operative Plan: Extubation in OR  Informed Consent: I have reviewed the patients History and Physical, chart, labs and discussed the procedure including the risks, benefits and alternatives for the proposed anesthesia with the patient or authorized representative who has indicated his/her understanding and acceptance.   Dental Advisory Given  Plan Discussed with: CRNA and Surgeon  Anesthesia Plan Comments:        Anesthesia Quick Evaluation

## 2011-11-06 NOTE — Transfer of Care (Signed)
Immediate Anesthesia Transfer of Care Note  Patient: Samuel Willis  Procedure(s) Performed: Procedure(s) (LRB): TENDON REPAIR (Left)  Patient Location: PACU  Anesthesia Type: General  Level of Consciousness: awake, alert  and patient cooperative  Airway & Oxygen Therapy: Patient Spontanous Breathing and Patient connected to face mask oxygen  Post-op Assessment: Report given to PACU RN and Post -op Vital signs reviewed and unstable, Anesthesiologist notified  Post vital signs: Reviewed and stable  Complications: No apparent anesthesia complications

## 2011-11-06 NOTE — Op Note (Signed)
NAMESHMIEL, MORTON               ACCOUNT NO.:  [id number]  MEDICAL RECORD NO.:  [id number]  LOCATION:  [facility]                         FACILITY:  [facility]  PHYSICIAN:  Katy Fitch. Kesa Birky, M.D. DATE OF BIRTH:  1946-01-31  DATE OF PROCEDURE:  11/06/2011 DATE OF DISCHARGE:  11/06/2011                              OPERATIVE REPORT   POSTOPERATIVE DIAGNOSIS:  Laceration of dorsal aspect of left hand with laceration of extensor tendon, small finger.  POSTOPERATIVE DIAGNOSIS:  Laceration of extensor digitorum longus tendon to ring finger and small finger and laceration of extensor digiti minimi with retraction proximal to extensor retinaculum requiring proximal incision for tendon recovery followed by repair of extensor digiti minimi, 2 slips.  OPERATING SURGEON:  Katy Fitch. Adyan Palau, MD  ASSISTANT:  Marveen Reeks Dasnoit, PA-C  ANESTHESIA:  General by LMA.  SUPERVISING ANESTHESIOLOGIST:  Janetta Hora. Gelene Mink, MD.  INDICATIONS:  Samuel Willis is a 66 year old gentleman referred for evaluation and management of a laceration in the dorsal aspect of his left hand with extensor lag to the small finger.  Over the past weekend, he was sharpening a lawnmower blade and accidentally drew the blade across the dorsum of his left hand.  He had bleeding and an immediate loss of extension of the small finger.  He was seen at the [facility] Emergency Room, where his wound was cleaned and sutured.  He was placed on antibiotics and given pain medication.  He was advised to seek an upper extremity orthopedic consult.  We saw him for consultation in our office and noted loss of extension to the small finger and some pain with extension of the ring finger.  We advised exploration of the wound and delayed primary repair of his tendons at this time.  Preoperatively, he was reviewed by Dr. Gelene Mink of anesthesia.  He was offered a block and requested general anesthesia.  Dr. Gelene Mink felt that this  was acceptable.  After informed consent, Mr. Proehl was brought to the operating room at this time.  PROCEDURE:  Davone Shinault was brought to room 1 of the [facility] and placed in supine position on the operating table.  Following the induction of general anesthesia by LMA technique, the left hand and arm were prepped with Betadine soap and solution and sterilely draped.  Following routine surgical time-out, the left hand and arm were exsanguinated with an Esmarch bandage and an arterial tourniquet on the proximal brachium inflated to 220 mmHg.  Procedure commenced with removal of the emergency room sutures.  The wound was opened and found to be mildly soiled.  Thorough irrigation and debridement with forceps removed foreign material.  All clot was debrided and the wound was irrigated a second time thoroughly.  The extensor to the ring finger was examined and found to have about a 30% laceration.  The extensor digitorum longus to the small finger was lacerated and retracted.  The proximal distal stumps were covered, and the tendon was repaired with core suture of 3-0 Ethibond.  The partial laceration of extensor digitorum longus to the ring finger was repaired with core suture of 3-0 Ethibond and finishing suture of 3-0 Ethibond. The extensor digiti minimi was  retracted proximally.  We could not recover it with massage and/or use of a tendon retrieving forceps.  We subsequently made an incision at the level of the ulnar head and identified the fifth dorsal compartment.  The tendon had retracted and was essentially balled up proximal to the retinaculum.  It was recovered and the 2 slips were independently grasped with a core suture that was then passed distally through the fifth dorsal compartment with a Swanson suture passer.  We then performed anatomic repair of the 2 slips of the extensor digiti minimi over the base of the small finger metacarpal with core suture  of 3-0 Ethibond and finishing suture of 3-0 Ethibond.  The extensor lag of the fingers was corrected.  The wound was then irrigated a final time followed by repair of the skin with subcutaneous 3-0 Vicryl and intradermal 3-0 Prolene.  Mr. Iovino was placed in compressive dressing with his wrist supported with a plaster splint in 30 degrees of extension and his MP joints of the ring and small fingers fully extended.  There were no apparent complications.  For aftercare, he will be provided prescriptions for hydrocodone 5 mg 1 p.o. q.4-6 h. p.r.n. pain 20 tablets of refill, also Keflex 500 mg 1 p.o. q.8 h. x4 days as a prophylactic antibiotic.     Katy Fitch Macintyre Alexa, M.D.     RVS/MEDQ  D:  11/06/2011  T:  11/06/2011  Job:  [id number]

## 2011-11-06 NOTE — Brief Op Note (Signed)
11/06/2011  12:30 PM  PATIENT:  Samuel Willis  66 y.o. male  PRE-OPERATIVE DIAGNOSIS:  laceration extensor tendons left hand  POST-OPERATIVE DIAGNOSIS:  laceration extensor tendons left hand  PROCEDURE:  EXTENSOR DIGITORUM TO LON AND RING FINGERS, RECOVERY OF RETRACTED EXTENSOR DIGITI MINIMI  WITH TENDON REPAIR (Left)  SURGEON:  Surgeon(s) and Role:    * Wyn Forster., MD - Primary  PHYSICIAN ASSISTANT:   ASSISTANTS: Mallory Shirk.A-C   ANESTHESIA:   general  EBL:     BLOOD ADMINISTERED:none  DRAINS: none   LOCAL MEDICATIONS USED:  XYLOCAINE   SPECIMEN:  No Specimen  DISPOSITION OF SPECIMEN:  N/A  COUNTS:  YES  TOURNIQUET:  * Missing tourniquet times found for documented tourniquets in log:  16109 *  DICTATION: .Other Dictation: Dictation Number [id number]  PLAN OF CARE: Discharge to home after PACU  PATIENT DISPOSITION:  PACU - hemodynamically stable.

## 2011-11-10 ENCOUNTER — Encounter (HOSPITAL_BASED_OUTPATIENT_CLINIC_OR_DEPARTMENT_OTHER): Payer: Self-pay | Admitting: Orthopedic Surgery

## 2011-11-13 DIAGNOSIS — S61409A Unspecified open wound of unspecified hand, initial encounter: Secondary | ICD-10-CM | POA: Diagnosis not present

## 2011-11-13 DIAGNOSIS — S66909A Unspecified injury of unspecified muscle, fascia and tendon at wrist and hand level, unspecified hand, initial encounter: Secondary | ICD-10-CM | POA: Diagnosis not present

## 2011-12-14 DIAGNOSIS — N478 Other disorders of prepuce: Secondary | ICD-10-CM | POA: Diagnosis not present

## 2011-12-14 DIAGNOSIS — N471 Phimosis: Secondary | ICD-10-CM | POA: Diagnosis not present

## 2011-12-16 ENCOUNTER — Other Ambulatory Visit: Payer: Self-pay | Admitting: Urology

## 2011-12-25 ENCOUNTER — Telehealth: Payer: Self-pay | Admitting: Internal Medicine

## 2011-12-25 MED ORDER — OMEPRAZOLE 40 MG PO CPDR: 40.0000 mg | DELAYED_RELEASE_CAPSULE | Freq: Every day | ORAL | Status: DC

## 2011-12-25 NOTE — Telephone Encounter (Signed)
REFILL SENT TO [facility], RX OMEPRAZOLE

## 2011-12-25 NOTE — Telephone Encounter (Signed)
Refill-omeprazole dr 40mg  cap. Take one capsule by mouth every day. OV due. Qty 30 last fill 5.13.13

## 2012-01-12 ENCOUNTER — Encounter (HOSPITAL_BASED_OUTPATIENT_CLINIC_OR_DEPARTMENT_OTHER): Payer: Self-pay | Admitting: *Deleted

## 2012-01-12 NOTE — Progress Notes (Signed)
Chart reviewed by Dr. Shireen Quan.  Ok to proceed.  Recommends SBE antibx coverage DOS.

## 2012-01-12 NOTE — Progress Notes (Signed)
Pt instructed npo p mn 8/4 x prilosec w sip of water.  To [facility] 8/5 @ 0615.  Needs istat onarrival . ekg in chart.

## 2012-01-15 NOTE — Progress Notes (Addendum)
Chart reviewed w dr. Payton Doughty .  No sbe coverage needed or  ordered per Dr. Council Mechanic.

## 2012-01-17 NOTE — H&P (Signed)
Samuel Willis is an 66 y.o. male.   Chief Complaint: For circ. HPI: Samuel Willis presents today as a referral from Dr. Rodena Medin for further assessment and possible treatment of some foreskin issues. Samuel Willis is currently 66 years of age without any real urologic history. He is generally satisfied with voiding. During intercourse he often gets some irritation on his inner foreskin. He has a frenulum band that gets irritated and occasionally will tear and bleed. He has had some slight difficulty with retracting foreskin. He is interested in consideration for a circumcision. Recent PSA was well within normal limits at 1.4. Again, he has no other urologic concerns.    Past Medical History  Diagnosis Date  . GERD (gastroesophageal reflux disease)   . Heart murmur   . Hyperlipidemia   . Hypertension   . Kidney stone   . Arrhythmia     atrial fibrillation  . Heart valve problem     aortic stenosis, aortic regurgitation (bicuspid aortic valve)  . Shortness of breath     with activities  . CAD (coronary artery disease)     aortic stenosis  . Aortic aneurysm, thoracic     bicuspid aortic valve. aortic stenosis, pt denies anuerysm  . H/O hiatal hernia     Past Surgical History  Procedure Date  . Esophagogastroduodenoscopy 02/23/2011    Barrett's esophagus, hiatal hernia - dilated for dysphagia  . Colonoscopy   . Tendon repair 11/06/2011    Procedure: TENDON REPAIR;  Surgeon: Wyn Forster., MD;  Location: [facility];  Service: Orthopedics;  Laterality: Left;  explore/repair tendons left hand   . Cardiac catheterization 06/24/10    Family History  Problem Relation Age of Onset  . Arthritis Mother   . Diabetes Mother   . Stroke Mother   . Heart attack Father   . Asthma Father   . Emphysema Father   . Heart disease Father   . Hyperlipidemia Neg Hx   . Hypertension Neg Hx    Social History:  reports that he quit smoking about 18 years ago. He has never used smokeless  tobacco. He reports that he does not drink alcohol or use illicit drugs.  Allergies: No Known Allergies  No prescriptions prior to admission    No results found for this or any previous visit (from the past 48 hour(s)). No results found.  Review of Systems - Negative except weak stream/ penile discomfort  Height 5\' 11"  (1.803 m), weight 92.987 kg (205 lb). General appearance: alert, cooperative and no distress Neck: no adenopathy and no JVD Resp: clear to auscultation bilaterally Cardio: regular rate and rhythm GI: soft, non-tender; bowel sounds normal; no masses,  no organomegaly Male genitalia:phimosis with frenular band Extremities: extremities normal, atraumatic, no cyanosis or edema Skin: Skin color, texture, turgor normal. No rashes or lesions Neurologic: Grossly normal  Assessment/Plan Samuel Willis has had issues with a frenulum band and foreskin that have caused him discomfort and bleeding with intercourse. Clinically he has no evidence of balanitis. He has some very minimal phimosis but I am able to retract the foreskin. He does have a frenulum attachment that does appear to pull on the undersurface of his meatus and I suspect this is what causes much of his discomfort. Obviously a surgical procedure is really his choice at this point. We could conceivably just take down the frenulum band but he is interested in circumcision, which is certainly reasonable. I would anticipate circumcision along with a detachment  of his frenulum band to decrease the pulling on the meatal area. We would do this as an outpatient. The procedure was discussed with him. Pros and cons, complications and recovery were discussed with him. He is interested in proceeding with this. He may want to delay this until the end of the summer and there is certainly no urgent need to press ahead.   Sylvia Kondracki S 01/17/2012, 8:18 PM

## 2012-01-18 ENCOUNTER — Ambulatory Visit (HOSPITAL_BASED_OUTPATIENT_CLINIC_OR_DEPARTMENT_OTHER): Payer: Medicare Other | Admitting: Anesthesiology

## 2012-01-18 ENCOUNTER — Encounter (HOSPITAL_BASED_OUTPATIENT_CLINIC_OR_DEPARTMENT_OTHER): Payer: Self-pay | Admitting: Anesthesiology

## 2012-01-18 ENCOUNTER — Encounter (HOSPITAL_BASED_OUTPATIENT_CLINIC_OR_DEPARTMENT_OTHER)
Admission: RE | Disposition: A | Discharge status: Home / Self Care | Payer: Self-pay | Source: Ambulatory Visit | Attending: Urology

## 2012-01-18 ENCOUNTER — Ambulatory Visit (HOSPITAL_BASED_OUTPATIENT_CLINIC_OR_DEPARTMENT_OTHER)
Admission: RE | Admit: 2012-01-18 | Discharge: 2012-01-18 | Disposition: A | Discharge status: Home / Self Care | Payer: Medicare Other | Source: Ambulatory Visit | Attending: Urology | Admitting: Urology

## 2012-01-18 ENCOUNTER — Encounter (HOSPITAL_BASED_OUTPATIENT_CLINIC_OR_DEPARTMENT_OTHER): Payer: Self-pay | Admitting: *Deleted

## 2012-01-18 DIAGNOSIS — N471 Phimosis: Secondary | ICD-10-CM | POA: Insufficient documentation

## 2012-01-18 DIAGNOSIS — I2510 Atherosclerotic heart disease of native coronary artery without angina pectoris: Secondary | ICD-10-CM | POA: Insufficient documentation

## 2012-01-18 DIAGNOSIS — Z79899 Other long term (current) drug therapy: Secondary | ICD-10-CM | POA: Insufficient documentation

## 2012-01-18 DIAGNOSIS — K219 Gastro-esophageal reflux disease without esophagitis: Secondary | ICD-10-CM | POA: Insufficient documentation

## 2012-01-18 DIAGNOSIS — I10 Essential (primary) hypertension: Secondary | ICD-10-CM | POA: Insufficient documentation

## 2012-01-18 DIAGNOSIS — N478 Other disorders of prepuce: Secondary | ICD-10-CM | POA: Insufficient documentation

## 2012-01-18 DIAGNOSIS — E785 Hyperlipidemia, unspecified: Secondary | ICD-10-CM | POA: Diagnosis not present

## 2012-01-18 HISTORY — DX: Personal history of other diseases of the digestive system: Z87.19

## 2012-01-18 HISTORY — PX: CIRCUMCISION: SHX1350

## 2012-01-18 LAB — POCT I-STAT 4, (NA,K, GLUC, HGB,HCT)
Glucose, Bld: 115 mg/dL — ABNORMAL HIGH (ref 70–99)
HCT: 41 % (ref 39.0–52.0)
Sodium: 144 mEq/L (ref 135–145)

## 2012-01-18 SURGERY — CIRCUMCISION, ADULT
Anesthesia: General | Site: Penis | Wound class: Clean Contaminated

## 2012-01-18 MED ORDER — DEXAMETHASONE SODIUM PHOSPHATE 4 MG/ML IJ SOLN
INTRAMUSCULAR | Status: DC | PRN
  Administered 2012-01-18 (×2): 5 mg via INTRAVENOUS

## 2012-01-18 MED ORDER — PROPOFOL 10 MG/ML IV EMUL
INTRAVENOUS | Status: DC | PRN
  Administered 2012-01-18: 150 mg via INTRAVENOUS

## 2012-01-18 MED ORDER — BACITRACIN ZINC 500 UNIT/GM EX OINT
TOPICAL_OINTMENT | CUTANEOUS | Status: DC | PRN
  Administered 2012-01-18: 1 via TOPICAL

## 2012-01-18 MED ORDER — EPHEDRINE SULFATE 50 MG/ML IJ SOLN
INTRAMUSCULAR | Status: DC | PRN
  Administered 2012-01-18 (×2): 10 mg via INTRAVENOUS

## 2012-01-18 MED ORDER — CEFAZOLIN SODIUM 1-5 GM-% IV SOLN: 1.0000 g | INTRAVENOUS | Status: DC

## 2012-01-18 MED ORDER — PROMETHAZINE HCL 25 MG/ML IJ SOLN: 6.2500 mg | INTRAMUSCULAR | Status: DC | PRN

## 2012-01-18 MED ORDER — LIDOCAINE HCL (CARDIAC) 20 MG/ML IV SOLN
INTRAVENOUS | Status: DC | PRN
  Administered 2012-01-18: 70 mg via INTRAVENOUS

## 2012-01-18 MED ORDER — LACTATED RINGERS IV SOLN
INTRAVENOUS | Status: DC
  Administered 2012-01-18 (×2): via INTRAVENOUS

## 2012-01-18 MED ORDER — CEFAZOLIN SODIUM-DEXTROSE 2-3 GM-% IV SOLR
2.0000 g | INTRAVENOUS | Status: AC
  Administered 2012-01-18: 2 g via INTRAVENOUS

## 2012-01-18 MED ORDER — GLYCOPYRROLATE 0.2 MG/ML IJ SOLN
INTRAMUSCULAR | Status: DC | PRN
  Administered 2012-01-18: 0.2 mg via INTRAVENOUS

## 2012-01-18 MED ORDER — HYDROCODONE-ACETAMINOPHEN 5-325 MG PO TABS: 1.0000 | ORAL_TABLET | Freq: Four times a day (QID) | ORAL | Status: AC | PRN

## 2012-01-18 MED ORDER — ONDANSETRON HCL 4 MG/2ML IJ SOLN
INTRAMUSCULAR | Status: DC | PRN
  Administered 2012-01-18: 4 mg via INTRAVENOUS

## 2012-01-18 MED ORDER — FENTANYL CITRATE 0.05 MG/ML IJ SOLN
INTRAMUSCULAR | Status: DC | PRN
  Administered 2012-01-18 (×2): 50 ug via INTRAVENOUS

## 2012-01-18 MED ORDER — FENTANYL CITRATE 0.05 MG/ML IJ SOLN: 25.0000 ug | INTRAMUSCULAR | Status: DC | PRN

## 2012-01-18 MED ORDER — BUPIVACAINE HCL (PF) 0.25 % IJ SOLN
INTRAMUSCULAR | Status: DC | PRN
  Administered 2012-01-18: 14 mL

## 2012-01-18 SURGICAL SUPPLY — 31 items
BANDAGE COBAN STERILE 2 (GAUZE/BANDAGES/DRESSINGS) ×2 IMPLANT
BLADE SURG 15 STRL LF DISP TIS (BLADE) ×1 IMPLANT
BLADE SURG 15 STRL SS (BLADE) ×1
BNDG COHESIVE 1X5 TAN STRL LF (GAUZE/BANDAGES/DRESSINGS) ×2 IMPLANT
CLOTH BEACON ORANGE TIMEOUT ST (SAFETY) ×2 IMPLANT
COVER MAYO STAND STRL (DRAPES) ×2 IMPLANT
COVER TABLE BACK 60X90 (DRAPES) ×2 IMPLANT
DRAPE PED LAPAROTOMY (DRAPES) ×2 IMPLANT
DRSG TEGADERM 2-3/8X2-3/4 SM (GAUZE/BANDAGES/DRESSINGS) IMPLANT
DRSG VASELINE 3X18 (GAUZE/BANDAGES/DRESSINGS) ×2 IMPLANT
ELECT NEEDLE TIP 2.8 STRL (NEEDLE) ×2 IMPLANT
ELECT REM PT RETURN 9FT ADLT (ELECTROSURGICAL) ×2
ELECTRODE REM PT RTRN 9FT ADLT (ELECTROSURGICAL) ×1 IMPLANT
GAUZE PETROLATUM 1 X8 (GAUZE/BANDAGES/DRESSINGS) ×2 IMPLANT
GAUZE SPONGE 4X4 12PLY STRL LF (GAUZE/BANDAGES/DRESSINGS) ×2 IMPLANT
GLOVE BIO SURGEON STRL SZ7.5 (GLOVE) ×2 IMPLANT
GLOVE ECLIPSE 6.5 STRL STRAW (GLOVE) ×2 IMPLANT
GOWN PREVENTION PLUS XLARGE (GOWN DISPOSABLE) ×2 IMPLANT
GOWN STRL NON-REIN LRG LVL3 (GOWN DISPOSABLE) ×2 IMPLANT
GOWN W/2 COTTON TOWELS 2 STD (GOWNS) ×2 IMPLANT
NEEDLE HYPO 25X1 1.5 SAFETY (NEEDLE) ×4 IMPLANT
NS IRRIG 500ML POUR BTL (IV SOLUTION) ×2 IMPLANT
PACK BASIN DAY SURGERY FS (CUSTOM PROCEDURE TRAY) ×2 IMPLANT
PENCIL BUTTON HOLSTER BLD 10FT (ELECTRODE) ×2 IMPLANT
SUT VIC AB 5-0 P-3 18X BRD (SUTURE) ×1 IMPLANT
SUT VIC AB 5-0 P3 18 (SUTURE) ×1
SUT VICRYL 4-0 PS2 18IN ABS (SUTURE) ×6 IMPLANT
SYR CONTROL 10ML LL (SYRINGE) ×4 IMPLANT
TOWEL OR 17X24 6PK STRL BLUE (TOWEL DISPOSABLE) ×4 IMPLANT
TRAY DSU PREP LF (CUSTOM PROCEDURE TRAY) ×2 IMPLANT
WATER STERILE IRR 500ML POUR (IV SOLUTION) IMPLANT

## 2012-01-18 NOTE — Anesthesia Postprocedure Evaluation (Signed)
  Anesthesia Post-op Note  Patient: Samuel Willis  Procedure(s) Performed: Procedure(s) (LRB): CIRCUMCISION ADULT (N/A)  Patient Location: PACU  Anesthesia Type: General  Level of Consciousness: awake and alert   Airway and Oxygen Therapy: Patient Spontanous Breathing  Post-op Pain: mild  Post-op Assessment: Post-op Vital signs reviewed, Patient's Cardiovascular Status Stable, Respiratory Function Stable, Patent Airway and No signs of Nausea or vomiting  Post-op Vital Signs: stable  Complications: No apparent anesthesia complications

## 2012-01-18 NOTE — Interval H&P Note (Signed)
History and Physical Interval Note:  01/18/2012 7:26 AM  Samuel Willis  has presented today for surgery, with the diagnosis of Phimosis  The various methods of treatment have been discussed with the patient and family. After consideration of risks, benefits and other options for treatment, the patient has consented to  Procedure(s) (LRB): CIRCUMCISION ADULT (N/A) as a surgical intervention .  The patient's history has been reviewed, patient examined, no change in status, stable for surgery.  I have reviewed the patient's chart and labs.  Questions were answered to the patient's satisfaction.     [name] S

## 2012-01-18 NOTE — Op Note (Signed)
Preoperative diagnosis: Phimosis Postoperative diagnosis: Same  Procedure: Circumcision  with takedown of frenular band. Surgeon: Valetta Fuller M.D. Anesthesia: Gen. with penile block  Indications: The patient was recently evaluated for phimosis. All risks and benefits of circumcision discussed. Full informed consent obtained. The patient now presents for definitive procedure.  Technique and findings: The patient was brought to the operating room. Successful induction of general anesthesia. A penile block was then performed with 11 cc of quarter percent Marcaine. The patient was then prepped and draped in usual manner. Appropriate surgical timeout was performed. A circumferential incision was made behind the glans penis. The foreskin was then retracted and a second circumferential incision was made within the mucosal collar. The sleeve of redundant skin was removed. A frenular band was taken down with a hemostat and those edges reapproximated with some interrupted 5-0 Vicryl suture. Skin edges were reapproximated with interrupted 4-0 Vicryl suture. The incision was wrapped with Xeroform gauze with bacitracin ointment. A Coban dressing was loosely wrapped. The patient was brought to recovery room in stable condition having had no obvious complications or problems. Sponge and needle counts were correct.

## 2012-01-18 NOTE — Anesthesia Preprocedure Evaluation (Signed)
Anesthesia Evaluation  Patient identified by MRN, date of birth, ID band Patient awake    Reviewed: Allergy & Precautions, H&P , NPO status , Patient's Chart, lab work & pertinent test results  Airway Mallampati: II TM Distance: <3 FB Neck ROM: Full    Dental No notable dental hx.    Pulmonary neg pulmonary ROS,  breath sounds clear to auscultation  Pulmonary exam normal       Cardiovascular hypertension, + CAD, + CABG and + Peripheral Vascular Disease + dysrhythmias Atrial Fibrillation III+ Valvular Problems/Murmurs AS Rhythm:Regular Rate:Normal + Systolic murmurs    Neuro/Psych negative neurological ROS  negative psych ROS   GI/Hepatic Neg liver ROS,   Endo/Other  negative endocrine ROS  Renal/GU negative Renal ROS  negative genitourinary   Musculoskeletal negative musculoskeletal ROS (+)   Abdominal   Peds negative pediatric ROS (+)  Hematology negative hematology ROS (+)   Anesthesia Other Findings   Reproductive/Obstetrics negative OB ROS                           Anesthesia Physical Anesthesia Plan  ASA: III  Anesthesia Plan: General   Post-op Pain Management:    Induction: Intravenous  Airway Management Planned: LMA  Additional Equipment:   Intra-op Plan:   Post-operative Plan:   Informed Consent: I have reviewed the patients History and Physical, chart, labs and discussed the procedure including the risks, benefits and alternatives for the proposed anesthesia with the patient or authorized representative who has indicated his/her understanding and acceptance.   Dental advisory given  Plan Discussed with: CRNA and Surgeon  Anesthesia Plan Comments:         Anesthesia Quick Evaluation

## 2012-01-18 NOTE — Discharge Instructions (Addendum)
Postoperative instructions for circumcision  Wound:  Remove the dressing the morning after surgery. In most cases your incision will have absorbable sutures that run along the course of your incision and will dissolve within the first 10-20 days. Some will fall out even earlier. Expect some redness as the sutures dissolved but this should occur only around the sutures. If there is generalized redness, especially with increasing pain or swelling, let us know. The penis will very likely get "black and blue" as the blood in the tissues spread. Sometimes the whole penis will turn colors. The black and blue is followed by a yellow and brown color. In time, all the discoloration will go away.  Diet:  You may return to your normal diet within 24 hours following your surgery. You may note some mild nausea and possibly vomiting the first 6-8 hours following surgery. This is usually due to the side effects of anesthesia, and will disappear quite soon. I would suggest clear liquids and a very light meal the first evening following your surgery.  Activity:  Your physical activity should be restricted the first 48 hours. During that time you should remain relatively inactive, moving about only when necessary. During the first 7-10 days following surgery he should avoid lifting any heavy objects (anything greater than 15 pounds), and avoid strenuous exercise. If you work, ask [place] specifically about your restrictions, both for work and home. We will write a note to your employer if needed. No sexual activity until be okay as given. Ice packs can be placed on and off over the penis for the first 48 hours to help relieve the pain and keep the swelling down. Frozen peas or corn in a ZipLock bag can be frozen, used and re-frozen. Fifteen minutes on and 15 minutes off is a reasonable schedule.  No sexual activity for 1 month.  Hygiene:  You may shower 48 hours after your surgery. Tub bathing should be restricted until  the seventh day.  Medication:  You will be sent home with some type of pain medication. In many cases you will be sent home with a narcotic pain pill (Vicodin or Tylox). If the pain is not too bad, you may take either Tylenol (acetaminophen) or Advil (ibuprofen) which contain no narcotic agents, and might be tolerated a little better, with fewer side effects. If the pain medication you are sent home with does not control the pain, you will have to let us know. Some narcotic pain medications cannot be given or refilled by a phone call to a pharmacy.  Problems you should report to [place]:   Fever of 101.0 degrees Fahrenheit or greater.  Moderate or severe swelling under the skin incision or involving the scrotum.  Drug reaction such as hives, a rash, nausea or vomiting.    Post Anesthesia Home Care Instructions  Activity: Get plenty of rest for the remainder of the day. A responsible adult should stay with you for 24 hours following the procedure.  For the next 24 hours, DO NOT: -Drive a car -Advertising copywriter -Drink alcoholic beverages -Take any medication unless instructed by your physician -Make any legal decisions or sign important papers.  Meals: Start with liquid foods such as gelatin or soup. Progress to regular foods as tolerated. Avoid greasy, spicy, heavy foods. If nausea and/or vomiting occur, drink only clear liquids until the nausea and/or vomiting subsides. Call your physician if vomiting continues.  Special Instructions/Symptoms: Your throat may feel dry or sore from the anesthesia or  the breathing tube placed in your throat during surgery. If this causes discomfort, gargle with warm salt water. The discomfort should disappear within 24 hours.

## 2012-01-18 NOTE — Transfer of Care (Signed)
Immediate Anesthesia Transfer of Care Note  Patient: Samuel Willis  Procedure(s) Performed: Procedure(s) (LRB): CIRCUMCISION ADULT (N/A)  Patient Location: PACU  Anesthesia Type: General  Level of Consciousness: awake, alert  and oriented  Airway & Oxygen Therapy: Patient Spontanous Breathing and Patient connected to face mask oxygen  Post-op Assessment: Report given to PACU RN and Post -op Vital signs reviewed and stable  Post vital signs: Reviewed and stable  Complications: No apparent anesthesia complications

## 2012-01-19 ENCOUNTER — Encounter (HOSPITAL_BASED_OUTPATIENT_CLINIC_OR_DEPARTMENT_OTHER): Payer: Self-pay | Admitting: Urology

## 2012-01-25 DIAGNOSIS — S61409A Unspecified open wound of unspecified hand, initial encounter: Secondary | ICD-10-CM | POA: Diagnosis not present

## 2012-02-16 ENCOUNTER — Other Ambulatory Visit: Payer: Self-pay | Admitting: Orthopedic Surgery

## 2012-02-17 ENCOUNTER — Encounter (HOSPITAL_BASED_OUTPATIENT_CLINIC_OR_DEPARTMENT_OTHER): Payer: Self-pay | Admitting: *Deleted

## 2012-02-17 NOTE — Progress Notes (Signed)
Pt came by-too early for labs -had surg 8/13-ekg done then Will do istat dos-going out of town until eve preop. Did review possible RCC stay-to bring all meds and overnight bag just in case

## 2012-02-21 DIAGNOSIS — R109 Unspecified abdominal pain: Secondary | ICD-10-CM | POA: Diagnosis not present

## 2012-02-22 NOTE — H&P (Signed)
Samuel Willis is an 66 y.o. male.   Chief Complaint: c/o chronic and progressive right shoulder pain and weakness HPI: Mr. Riedesel presents for follow-up examination of his right shoulder. He has completed the rupture of his long head of the biceps.  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  He has pain in his shoulder at the limits of motion.  He has signs of impingement and rotator cuff tendinopathy.  We have a copy of his MRI which we will be reviewing prior to his shoulder procedure.      Past Medical History  Diagnosis Date  . GERD (gastroesophageal reflux disease)   . Heart murmur   . Hyperlipidemia   . Hypertension   . Kidney stone   . Arrhythmia     atrial fibrillation  . Heart valve problem     aortic stenosis, aortic regurgitation (bicuspid aortic valve)  . Shortness of breath     with activities  . CAD (coronary artery disease)     aortic stenosis  . Aortic aneurysm, thoracic     bicuspid aortic valve. aortic stenosis, pt denies anuerysm  . H/O hiatal hernia   . Wears glasses     Past Surgical History  Procedure Date  . Esophagogastroduodenoscopy 02/23/2011    Barrett's esophagus, hiatal hernia - dilated for dysphagia  . Colonoscopy   . Tendon repair 11/06/2011    Procedure: TENDON REPAIR;  Surgeon: Wyn Forster., MD;  Location: [facility];  Service: Orthopedics;  Laterality: Left;  explore/repair tendons left hand   . Cardiac catheterization 06/24/10  . Circumcision 01/18/2012    Procedure: CIRCUMCISION ADULT;  Surgeon: Valetta Fuller, MD;  Location: [facility];  Service: Urology;  Laterality: N/A;  30 mins requested for this case  TAKE DOWN OF FRENULUM BAND      Family History  Problem Relation Age of Onset  . Arthritis Mother   . Diabetes Mother   . Stroke Mother   . Heart attack Father   . Asthma Father   . Emphysema Father   . Heart disease Father   . Hyperlipidemia Neg Hx   . Hypertension Neg Hx    Social  History:  reports that he quit smoking about 18 years ago. He has never used smokeless tobacco. He reports that he does not drink alcohol or use illicit drugs.  Allergies: No Known Allergies  No prescriptions prior to admission    No results found for this or any previous visit (from the past 48 hour(s)).  No results found.   Pertinent items are noted in HPI.  There were no vitals taken for this visit.  General appearance: alert Head: Normocephalic, without obvious abnormality Neck: supple, symmetrical, trachea midline Resp: clear to auscultation bilaterally Cardio: regular rate and rhythm GI: normal findings: bowel sounds normal Extremities Exam of the right shoulder reveals obvious biceps tendon rupture with "Pop-eye" deformity. ROM is 160 on the right vs. 175 on the left with FF. Abduction is 150 on the right vs. 175 left.  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   DASNOIT,Shella Lahman J 02/22/2012, 4:01 PM    H&P documentation: 02/23/2012  -History and  Physical Reviewed  -Patient has been re-examined  -No change in the plan of care  Wyn Forster, MD

## 2012-02-23 ENCOUNTER — Telehealth: Payer: Self-pay | Admitting: Internal Medicine

## 2012-02-23 ENCOUNTER — Encounter (HOSPITAL_BASED_OUTPATIENT_CLINIC_OR_DEPARTMENT_OTHER): Payer: Self-pay | Admitting: *Deleted

## 2012-02-23 ENCOUNTER — Encounter (HOSPITAL_BASED_OUTPATIENT_CLINIC_OR_DEPARTMENT_OTHER): Payer: Self-pay | Admitting: Anesthesiology

## 2012-02-23 ENCOUNTER — Encounter (HOSPITAL_BASED_OUTPATIENT_CLINIC_OR_DEPARTMENT_OTHER)
Admission: RE | Disposition: A | Discharge status: Home / Self Care | Payer: Self-pay | Source: Ambulatory Visit | Attending: Orthopedic Surgery

## 2012-02-23 ENCOUNTER — Ambulatory Visit (HOSPITAL_BASED_OUTPATIENT_CLINIC_OR_DEPARTMENT_OTHER)
Admission: RE | Admit: 2012-02-23 | Discharge: 2012-02-24 | Disposition: A | Discharge status: Home / Self Care | Payer: Medicare Other | Source: Ambulatory Visit | Attending: Orthopedic Surgery | Admitting: Orthopedic Surgery

## 2012-02-23 ENCOUNTER — Ambulatory Visit (HOSPITAL_BASED_OUTPATIENT_CLINIC_OR_DEPARTMENT_OTHER): Payer: Medicare Other | Admitting: Anesthesiology

## 2012-02-23 DIAGNOSIS — I4891 Unspecified atrial fibrillation: Secondary | ICD-10-CM | POA: Insufficient documentation

## 2012-02-23 DIAGNOSIS — M25819 Other specified joint disorders, unspecified shoulder: Secondary | ICD-10-CM | POA: Diagnosis not present

## 2012-02-23 DIAGNOSIS — I10 Essential (primary) hypertension: Secondary | ICD-10-CM | POA: Diagnosis not present

## 2012-02-23 DIAGNOSIS — G8918 Other acute postprocedural pain: Secondary | ICD-10-CM | POA: Diagnosis not present

## 2012-02-23 DIAGNOSIS — M19019 Primary osteoarthritis, unspecified shoulder: Secondary | ICD-10-CM | POA: Insufficient documentation

## 2012-02-23 DIAGNOSIS — I359 Nonrheumatic aortic valve disorder, unspecified: Secondary | ICD-10-CM | POA: Diagnosis not present

## 2012-02-23 DIAGNOSIS — M24119 Other articular cartilage disorders, unspecified shoulder: Secondary | ICD-10-CM | POA: Diagnosis not present

## 2012-02-23 DIAGNOSIS — M7500 Adhesive capsulitis of unspecified shoulder: Secondary | ICD-10-CM | POA: Diagnosis not present

## 2012-02-23 DIAGNOSIS — M67919 Unspecified disorder of synovium and tendon, unspecified shoulder: Secondary | ICD-10-CM | POA: Diagnosis not present

## 2012-02-23 DIAGNOSIS — M719 Bursopathy, unspecified: Secondary | ICD-10-CM | POA: Diagnosis not present

## 2012-02-23 DIAGNOSIS — M66329 Spontaneous rupture of flexor tendons, unspecified upper arm: Secondary | ICD-10-CM | POA: Diagnosis not present

## 2012-02-23 DIAGNOSIS — M25519 Pain in unspecified shoulder: Secondary | ICD-10-CM | POA: Diagnosis not present

## 2012-02-23 DIAGNOSIS — K219 Gastro-esophageal reflux disease without esophagitis: Secondary | ICD-10-CM | POA: Insufficient documentation

## 2012-02-23 DIAGNOSIS — M75120 Complete rotator cuff tear or rupture of unspecified shoulder, not specified as traumatic: Secondary | ICD-10-CM | POA: Diagnosis not present

## 2012-02-23 LAB — POCT I-STAT, CHEM 8
Calcium, Ion: 1.21 mmol/L (ref 1.13–1.30)
Chloride: 104 mEq/L (ref 96–112)
Glucose, Bld: 109 mg/dL — ABNORMAL HIGH (ref 70–99)
HCT: 41 % (ref 39.0–52.0)

## 2012-02-23 SURGERY — SHOULDER ARTHROSCOPY WITH ROTATOR CUFF REPAIR AND SUBACROMIAL DECOMPRESSION
Anesthesia: General | Site: Shoulder | Laterality: Right | Wound class: Clean

## 2012-02-23 MED ORDER — METOCLOPRAMIDE HCL 5 MG PO TABS: 5.0000 mg | ORAL_TABLET | Freq: Three times a day (TID) | ORAL | Status: DC | PRN

## 2012-02-23 MED ORDER — SUCCINYLCHOLINE CHLORIDE 20 MG/ML IJ SOLN
INTRAMUSCULAR | Status: DC | PRN
  Administered 2012-02-23: 140 mg via INTRAVENOUS

## 2012-02-23 MED ORDER — FENTANYL CITRATE 0.05 MG/ML IJ SOLN
INTRAMUSCULAR | Status: DC | PRN
  Administered 2012-02-23 (×4): 25 ug via INTRAVENOUS

## 2012-02-23 MED ORDER — HYDROMORPHONE HCL PF 1 MG/ML IJ SOLN: 0.5000 mg | INTRAMUSCULAR | Status: DC | PRN

## 2012-02-23 MED ORDER — LIDOCAINE HCL 1 % IJ SOLN
INTRAMUSCULAR | Status: DC | PRN
  Administered 2012-02-23: 2 mL via INTRADERMAL

## 2012-02-23 MED ORDER — HYDROMORPHONE HCL 2 MG PO TABS: ORAL_TABLET | ORAL | Status: AC

## 2012-02-23 MED ORDER — METOCLOPRAMIDE HCL 5 MG/ML IJ SOLN: 10.0000 mg | Freq: Once | INTRAMUSCULAR | Status: AC | PRN

## 2012-02-23 MED ORDER — FENTANYL CITRATE 0.05 MG/ML IJ SOLN
50.0000 ug | INTRAMUSCULAR | Status: DC | PRN
  Administered 2012-02-23: 100 ug via INTRAVENOUS

## 2012-02-23 MED ORDER — ROPIVACAINE HCL 5 MG/ML IJ SOLN
INTRAMUSCULAR | Status: DC | PRN
  Administered 2012-02-23: 15 mL

## 2012-02-23 MED ORDER — DEXAMETHASONE SODIUM PHOSPHATE 4 MG/ML IJ SOLN
INTRAMUSCULAR | Status: DC | PRN
  Administered 2012-02-23: 10 mg via INTRAVENOUS

## 2012-02-23 MED ORDER — OXYCODONE HCL 5 MG/5ML PO SOLN: 5.0000 mg | Freq: Once | ORAL | Status: AC | PRN

## 2012-02-23 MED ORDER — EPHEDRINE SULFATE 50 MG/ML IJ SOLN
INTRAMUSCULAR | Status: DC | PRN
  Administered 2012-02-23 (×2): 10 mg via INTRAVENOUS

## 2012-02-23 MED ORDER — SODIUM CHLORIDE 0.9 % IR SOLN
Status: DC | PRN
  Administered 2012-02-23: 14

## 2012-02-23 MED ORDER — ONDANSETRON HCL 4 MG/2ML IJ SOLN
INTRAMUSCULAR | Status: DC | PRN
  Administered 2012-02-23: 4 mg via INTRAVENOUS

## 2012-02-23 MED ORDER — PROPOFOL 10 MG/ML IV BOLUS
INTRAVENOUS | Status: DC | PRN
  Administered 2012-02-23: 230 mg via INTRAVENOUS

## 2012-02-23 MED ORDER — OXYCODONE-ACETAMINOPHEN 5-325 MG PO TABS
1.0000 | ORAL_TABLET | ORAL | Status: DC | PRN
  Administered 2012-02-23 – 2012-02-24 (×2): 2 via ORAL

## 2012-02-23 MED ORDER — LACTATED RINGERS IV SOLN
INTRAVENOUS | Status: DC
  Administered 2012-02-23 (×3): via INTRAVENOUS

## 2012-02-23 MED ORDER — CEFAZOLIN SODIUM-DEXTROSE 2-3 GM-% IV SOLR
2.0000 g | Freq: Once | INTRAVENOUS | Status: AC
  Administered 2012-02-23: 2 g via INTRAVENOUS

## 2012-02-23 MED ORDER — CHLORHEXIDINE GLUCONATE 4 % EX LIQD: 60.0000 mL | Freq: Once | CUTANEOUS | Status: DC

## 2012-02-23 MED ORDER — MIDAZOLAM HCL 5 MG/5ML IJ SOLN
INTRAMUSCULAR | Status: DC | PRN
  Administered 2012-02-23: 2 mg via INTRAVENOUS

## 2012-02-23 MED ORDER — METHOCARBAMOL 500 MG PO TABS: 500.0000 mg | ORAL_TABLET | Freq: Four times a day (QID) | ORAL | Status: DC | PRN

## 2012-02-23 MED ORDER — MIDAZOLAM HCL 2 MG/2ML IJ SOLN
1.0000 mg | INTRAMUSCULAR | Status: DC | PRN
  Administered 2012-02-23: 2 mg via INTRAVENOUS

## 2012-02-23 MED ORDER — METHOCARBAMOL 100 MG/ML IJ SOLN: 500.0000 mg | Freq: Four times a day (QID) | INTRAVENOUS | Status: DC | PRN

## 2012-02-23 MED ORDER — OXYCODONE HCL 5 MG PO TABS: 5.0000 mg | ORAL_TABLET | Freq: Once | ORAL | Status: AC | PRN

## 2012-02-23 MED ORDER — HYDROMORPHONE HCL PF 1 MG/ML IJ SOLN: 0.2500 mg | INTRAMUSCULAR | Status: DC | PRN

## 2012-02-23 MED ORDER — CEPHALEXIN 500 MG PO CAPS: 500.0000 mg | ORAL_CAPSULE | Freq: Three times a day (TID) | ORAL | Status: AC

## 2012-02-23 MED ORDER — LACTATED RINGERS IV SOLN
INTRAVENOUS | Status: DC
  Administered 2012-02-23: 20 mL/h via INTRAVENOUS

## 2012-02-23 SURGICAL SUPPLY — 75 items
ANCHOR SUT BIO SW 4.75X19.1 (Anchor) ×4 IMPLANT
BANDAGE ADHESIVE 1X3 (GAUZE/BANDAGES/DRESSINGS) IMPLANT
BLADE AVERAGE 25X9 (BLADE) IMPLANT
BLADE CUTTER MENIS 5.5 (BLADE) ×2 IMPLANT
BLADE SURG 15 STRL LF DISP TIS (BLADE) ×1 IMPLANT
BLADE SURG 15 STRL SS (BLADE) ×1
BUR EGG 3PK/BX (BURR) IMPLANT
BUR OVAL 6.0 (BURR) ×2 IMPLANT
CANISTER OMNI JUG 16 LITER (MISCELLANEOUS) ×6 IMPLANT
CANISTER SUCTION 2500CC (MISCELLANEOUS) IMPLANT
CANNULA TWIST IN 8.25X7CM (CANNULA) ×2 IMPLANT
CLEANER CAUTERY TIP 5X5 PAD (MISCELLANEOUS) IMPLANT
CLOTH BEACON ORANGE TIMEOUT ST (SAFETY) ×2 IMPLANT
CUTTER MENISCUS  4.2MM (BLADE) ×1
CUTTER MENISCUS 4.2MM (BLADE) ×1 IMPLANT
DECANTER SPIKE VIAL GLASS SM (MISCELLANEOUS) IMPLANT
DRAPE INCISE IOBAN 66X45 STRL (DRAPES) ×2 IMPLANT
DRAPE STERI 35X30 U-POUCH (DRAPES) ×2 IMPLANT
DRAPE SURG 17X23 STRL (DRAPES) ×2 IMPLANT
DRAPE U-SHAPE 47X51 STRL (DRAPES) ×2 IMPLANT
DRAPE U-SHAPE 76X120 STRL (DRAPES) ×4 IMPLANT
DRSG PAD ABDOMINAL 8X10 ST (GAUZE/BANDAGES/DRESSINGS) ×2 IMPLANT
DURAPREP 26ML APPLICATOR (WOUND CARE) ×2 IMPLANT
ELECT REM PT RETURN 9FT ADLT (ELECTROSURGICAL)
ELECTRODE REM PT RTRN 9FT ADLT (ELECTROSURGICAL) IMPLANT
GLOVE BIOGEL M STRL SZ7.5 (GLOVE) ×2 IMPLANT
GLOVE BIOGEL PI IND STRL 8 (GLOVE) ×3 IMPLANT
GLOVE BIOGEL PI INDICATOR 8 (GLOVE) ×3
GLOVE ORTHO TXT STRL SZ7.5 (GLOVE) ×4 IMPLANT
GOWN BRE IMP PREV XXLGXLNG (GOWN DISPOSABLE) ×4 IMPLANT
GOWN STRL REIN 2XL XLG LVL4 (GOWN DISPOSABLE) ×2 IMPLANT
NDL SUT 6 .5 CRC .975X.05 MAYO (NEEDLE) IMPLANT
NEEDLE MAYO TAPER (NEEDLE)
NEEDLE MINI RC 24MM (NEEDLE) IMPLANT
NEEDLE SCORPION (NEEDLE) ×2 IMPLANT
PACK ARTHROSCOPY DSU (CUSTOM PROCEDURE TRAY) ×2 IMPLANT
PACK BASIN DAY SURGERY FS (CUSTOM PROCEDURE TRAY) ×2 IMPLANT
PAD CLEANER CAUTERY TIP 5X5 (MISCELLANEOUS)
PASSER SUT SWANSON 36MM LOOP (INSTRUMENTS) IMPLANT
PENCIL BUTTON HOLSTER BLD 10FT (ELECTRODE) IMPLANT
SLEEVE SCD COMPRESS KNEE MED (MISCELLANEOUS) ×2 IMPLANT
SLING ARM FOAM STRAP LRG (SOFTGOODS) ×2 IMPLANT
SLING ARM FOAM STRAP MED (SOFTGOODS) IMPLANT
SPONGE GAUZE 4X4 12PLY (GAUZE/BANDAGES/DRESSINGS) ×2 IMPLANT
SPONGE LAP 4X18 X RAY DECT (DISPOSABLE) IMPLANT
STRIP CLOSURE SKIN 1/2X4 (GAUZE/BANDAGES/DRESSINGS) ×2 IMPLANT
SUCTION FRAZIER TIP 10 FR DISP (SUCTIONS) IMPLANT
SUT ETHIBOND 2 OS 4 DA (SUTURE) IMPLANT
SUT ETHILON 4 0 PS 2 18 (SUTURE) IMPLANT
SUT FIBERWIRE #2 38 T-5 BLUE (SUTURE)
SUT FIBERWIRE 3-0 18 TAPR NDL (SUTURE)
SUT LASSO 45 DEGREE LEFT (SUTURE) ×2 IMPLANT
SUT PROLENE 1 CT (SUTURE) IMPLANT
SUT PROLENE 3 0 PS 2 (SUTURE) ×4 IMPLANT
SUT TIGER TAPE 7 IN WHITE (SUTURE) ×2 IMPLANT
SUT VIC AB 0 CT1 27 (SUTURE)
SUT VIC AB 0 CT1 27XBRD ANBCTR (SUTURE) IMPLANT
SUT VIC AB 0 SH 27 (SUTURE) IMPLANT
SUT VIC AB 2-0 SH 27 (SUTURE)
SUT VIC AB 2-0 SH 27XBRD (SUTURE) IMPLANT
SUT VIC AB 3-0 SH 27 (SUTURE) ×1
SUT VIC AB 3-0 SH 27X BRD (SUTURE) ×1 IMPLANT
SUT VIC AB 3-0 X1 27 (SUTURE) IMPLANT
SUTURE FIBERWR #2 38 T-5 BLUE (SUTURE) IMPLANT
SUTURE FIBERWR 3-0 18 TAPR NDL (SUTURE) IMPLANT
SYR 3ML 23GX1 SAFETY (SYRINGE) IMPLANT
SYR BULB 3OZ (MISCELLANEOUS) IMPLANT
TAPE FIBER 2MM 7IN #2 BLUE (SUTURE) ×2 IMPLANT
TAPE PAPER 3X10 WHT MICROPORE (GAUZE/BANDAGES/DRESSINGS) ×2 IMPLANT
TOWEL OR 17X24 6PK STRL BLUE (TOWEL DISPOSABLE) ×2 IMPLANT
TUBE CONNECTING 20X1/4 (TUBING) ×4 IMPLANT
TUBING ARTHROSCOPY IRRIG 16FT (MISCELLANEOUS) ×2 IMPLANT
WAND STAR VAC 90 (SURGICAL WAND) ×2 IMPLANT
WATER STERILE IRR 1000ML POUR (IV SOLUTION) ×2 IMPLANT
YANKAUER SUCT BULB TIP NO VENT (SUCTIONS) IMPLANT

## 2012-02-23 NOTE — Anesthesia Procedure Notes (Addendum)
Anesthesia Regional Block:  Interscalene brachial plexus block  Pre-Anesthetic Checklist: ,, timeout performed, Correct Patient, Correct Site, Correct Laterality, Correct Procedure, Correct Position, site marked, Risks and benefits discussed,  Surgical consent,  Pre-op evaluation,  At surgeon's request and post-op pain management  Laterality: Right  Prep: chloraprep       Needles:   Needle Type: Other   (Arrow Echogenic)   Needle Length: 9cm  Needle Gauge: 21    Additional Needles:  Procedures: ultrasound guided Interscalene brachial plexus block Narrative:  Start time: 02/23/2012 12:39 PM End time: 02/23/2012 12:47 PM Injection made incrementally with aspirations every 5 mL.  Performed by: Personally  Anesthesiologist: Aldona Lento, MD  Additional Notes: Ultrasound guidance used to: id relevant anatomy, confirm needle position, local anesthetic spread, avoidance of vascular puncture. Picture saved. No complications. Block performed personally by Janetta Hora. Gelene Mink, MD    Interscalene brachial plexus block Procedure Name: Intubation Date/Time: 02/23/2012 1:52 PM Performed by: Gar Gibbon Pre-anesthesia Checklist: Patient identified, Emergency Drugs available, Suction available and Patient being monitored Patient Re-evaluated:Patient Re-evaluated prior to inductionOxygen Delivery Method: Circle System Utilized Preoxygenation: Pre-oxygenation with 100% oxygen Intubation Type: IV induction Ventilation: Mask ventilation without difficulty Laryngoscope Size: Miller and 3 Grade View: Grade II Tube type: Oral Tube size: 8.0 mm Number of attempts: 1 Airway Equipment and Method: stylet and oral airway Placement Confirmation: ETT inserted through vocal cords under direct vision,  positive ETCO2 and breath sounds checked- equal and bilateral Secured at: 22 cm Tube secured with: Tape Dental Injury: Teeth and Oropharynx as per pre-operative assessment

## 2012-02-23 NOTE — Transfer of Care (Signed)
Immediate Anesthesia Transfer of Care Note  Patient: Samuel Willis  Procedure(s) Performed: Procedure(s) (LRB) with comments: SHOULDER ARTHROSCOPY WITH ROTATOR CUFF REPAIR AND SUBACROMIAL DECOMPRESSION (Right) - arthroscopy with rotator cuff repair, with distal clavical rescetion  and subacromial decompression  Patient Location: PACU  Anesthesia Type: GA combined with regional for post-op pain  Level of Consciousness: awake and alert   Airway & Oxygen Therapy: Patient Spontanous Breathing and Patient connected to face mask oxygen  Post-op Assessment: Report given to PACU RN and Post -op Vital signs reviewed and stable  Post vital signs: Reviewed and stable  Complications: No apparent anesthesia complications

## 2012-02-23 NOTE — Progress Notes (Signed)
Assisted Dr. Frederick with right, ultrasound guided, interscalene  block. Side rails up, monitors on throughout procedure. See vital signs in flow sheet. Tolerated Procedure well. 

## 2012-02-23 NOTE — Telephone Encounter (Signed)
Patient went to the ER in [place] on Sunday with a kidney stone.  He is scheduled to have surgery on his shoulder for a bone spur today @ 12:00.  He wanted Dr Hodgin's opinion on whether he should cancel the surgery for now.

## 2012-02-23 NOTE — Telephone Encounter (Signed)
Told the patient that the best course might be to call his surgeon and he advised he would do that

## 2012-02-23 NOTE — Brief Op Note (Signed)
02/23/2012  4:13 PM  PATIENT:  Docia Chuck  66 y.o. 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  PROCEDURE:  Procedure(s) (LRB) with comments: SHOULDER ARTHROSCOPY WITH ROTATOR CUFF REPAIR AND SUBACROMIAL DECOMPRESSION (Right) - arthroscopy with rotator cuff repair, with distal clavical rescetion  and subacromial decompression and debridement of biceps stump  SURGEON:  Surgeon(s) and Role:    * Wyn Forster., MD - Primary  PHYSICIAN ASSISTANT:   ASSISTANTS: Mallory Shirk.A-C   ANESTHESIA:   general  EBL:  Total I/O In: 2000 [I.V.:2000] Out: -   BLOOD ADMINISTERED:none  DRAINS: none   LOCAL MEDICATIONS USED: ropivacaine plexus block  SPECIMEN:  No Specimen  DISPOSITION OF SPECIMEN:  N/A  COUNTS:  YES  TOURNIQUET:  * No tourniquets in log *  DICTATION: .Other Dictation: Dictation Number [id number]  PLAN OF CARE: Admit for overnight observation  PATIENT DISPOSITION:  PACU - hemodynamically stable.

## 2012-02-23 NOTE — Op Note (Signed)
[id number]  

## 2012-02-23 NOTE — Anesthesia Preprocedure Evaluation (Addendum)
Anesthesia Evaluation  Patient identified by MRN, date of birth, ID band Patient awake    Reviewed: Allergy & Precautions, H&P , NPO status , Patient's Chart, lab work & pertinent test results, reviewed documented beta blocker date and time   Airway Mallampati: II TM Distance: >3 FB Neck ROM: full    Dental   Pulmonary shortness of breath and with exertion,  breath sounds clear to auscultation        Cardiovascular hypertension, On Medications + CAD negative cardio ROS  + dysrhythmias Atrial Fibrillation + Valvular Problems/Murmurs AS and AI Rhythm:regular     Neuro/Psych negative neurological ROS  negative psych ROS   GI/Hepatic Neg liver ROS, hiatal hernia, GERD-  Medicated and Controlled,  Endo/Other  negative endocrine ROS  Renal/GU Renal disease  negative genitourinary   Musculoskeletal   Abdominal   Peds  Hematology negative hematology ROS (+)   Anesthesia Other Findings See surgeon's H&P   Reproductive/Obstetrics negative OB ROS                           Anesthesia Physical Anesthesia Plan  ASA: III  Anesthesia Plan: General   Post-op Pain Management:    Induction:   Airway Management Planned: Oral ETT  Additional Equipment:   Intra-op Plan:   Post-operative Plan: Extubation in OR  Informed Consent: I have reviewed the patients History and Physical, chart, labs and discussed the procedure including the risks, benefits and alternatives for the proposed anesthesia with the patient or authorized representative who has indicated his/her understanding and acceptance.   Dental Advisory Given  Plan Discussed with: CRNA and Surgeon  Anesthesia Plan Comments:        Anesthesia Quick Evaluation

## 2012-02-23 NOTE — Anesthesia Postprocedure Evaluation (Signed)
Anesthesia Post Note  Patient: Samuel Willis  Procedure(s) Performed: Procedure(s) (LRB): SHOULDER ARTHROSCOPY WITH ROTATOR CUFF REPAIR AND SUBACROMIAL DECOMPRESSION (Right)  Anesthesia type: General  Patient location: PACU  Post pain: Pain level controlled  Post assessment: Patient's Cardiovascular Status Stable  Last Vitals:  Filed Vitals:   02/23/12 1630  BP: 99/61  Pulse: 63  Temp: 36.6 C  Resp: 11    Post vital signs: Reviewed and stable  Level of consciousness: alert  Complications: No apparent anesthesia complications

## 2012-02-24 NOTE — Op Note (Signed)
Samuel Willis, Samuel Willis               ACCOUNT NO.:  [id number]  MEDICAL RECORD NO.:  [id number]  LOCATION:                                 FACILITY:  PHYSICIAN:  Katy Fitch. Quantavius Humm, M.D. DATE OF BIRTH:  1945/11/12  DATE OF PROCEDURE:  02/23/2012 DATE OF DISCHARGE:                              OPERATIVE REPORT   PREOPERATIVE DIAGNOSES:  Chronic right shoulder pain with recent rupture of long head of biceps, unfavorable acromioclavicular anatomy due to osteoarthritis of the acromioclavicular joint and unfavorable anterolateral acromial anatomy causing chronic stage III impingement.  POSTOPERATIVE DIAGNOSES:  Grade 4 tear of subscapularis with unfavorable acromial anatomy, rupture of long head of biceps in bicipital groove and chronic stage III impingement.  OPERATION: 1. Examination of right shoulder under anesthesia demonstrating mild     adhesive capsulitis. 2. Diagnostic arthroscopy followed by arthroscopic debridement of     labrum, biceps remnant, and capsular adhesions. 3. Arthroscopic subacromial decompression with coracoacromial ligament     release and acromioplasty. 4. Arthroscopic distal clavicle resection. 5. Arthroscopic reconstruction of complete subscapularis rupture with     use of a pair of FiberTapes and 2 anterior swivel locks recreating     an anatomic footprint of the subscapularis.  OPERATING SURGEON:  Katy Fitch. Selam Pietsch, MD  ASSISTANT:  Marveen Reeks Dasnoit, PA-C  ANESTHESIA:  General endotracheal supplemented by a right ropivacaine plexus block.  SUPERVISING ANESTHESIOLOGIST:  Janetta Hora. Gelene Mink, MD  INDICATIONS:  Samuel Willis is a 66 year old right-hand dominant gentleman, well acquainted with our practice from prior hand surgery care, who presented requesting evaluation of his right shoulder pain predicament.  He had been previously worked up at another orthopedic office with a shoulder MRI that documented advanced subscapularis tendinopathy,  unfavorable AC anatomy, blunting of his labrum, and biceps tendinopathy.  Between the time he was evaluated by the first orthopedic office and his presentation to our office, he had ruptured the long head of his biceps on the right.  We advised him to let this be, however, we did engage evaluation of his shoulder and identified significant AC arthritis, signs of probable rotator cuff tendinopathy and weakness of internal rotation with a positive push-up test suggesting subscapularis tendinopathy.  We reviewed his MRI once it was recovered from the other office and identified unfavorable bony anatomy and the subscapularis tear.  We advised him to proceed with diagnostic arthroscopy, anticipating arthroscopic repair of the subscapularis rotator cuff tendon, probable debridement of the long head of the biceps stump, subacromial decompression, distal clavicle resection.  We told Mr. Stief we would repair any other issues that we identified at the time of arthroscopy. Question regarding the anticipated surgery invited and answered in detail.  PROCEDURE:  Samuel Willis was interviewed in the holding area and his proper surgical site identified per protocol.  He subsequently had detailed anesthesia and informed consent by Dr. Gelene Mink and agreed to general endotracheal anesthesia supplemented by ropivacaine plexus block.  Dr. Gelene Mink placed the block in the holding area with ultrasound control without complication.  Mr. Vastine had excellent anesthesia of his right upper extremity in forequarter.  He was transferred to room 2 of the [facility]  where under Dr. Thornton Dales direct supervision, general endotracheal anesthesia was induced followed by preparation of the skin with DuraPrep and draping with impervious arthroscopy drapes.  Mr. Nicolini was noted to have a comedone directly at one of the scope insertional portal sites posteriorly.  Therefore, this was prepped with  Betadine and extracted with a curette followed by reprepping with Betadine.  In my view, this slightly increases risk of infection following arthroscopy due to likely skin flora being harbored within this deep cyst.  After careful positioning in the beach-chair position with aid of a torso and head holder, we proceeded with routine surgical time-out.  The scope was introduced through a standard posterior viewing portal with an anterior switching stick technique.  Mr. Sohn clearly had a degree of adhesive capsulitis due to difficulty instrumenting the joint.  Examination of the shoulder under anesthesia confirmed that indeed his combined elevation was 155, external rotation limited at 70, internal rotation of only 20.  With gentle manipulation, we increased his range of motion with combined elevation 170, external rotation 90, extension to the interscapular plane and internal rotation of at least 60.  We then proceeded to perform diagnostic arthroscopy which revealed intact hyaline articular cartilage surfaces on the glenoid.  There was some fraying of the anterior-posterior labral margins not inconsistent with age 66 years.  He had a biceps rupture in the bicipital groove and a comma sign with medial retraction of the subscapularis.  The subscapularis had pseudotendon at the lesser tuberosity.  This had been identified preoperatively upon review of his MRI.  With a nerve hook, I simply palpated the pseudotendon at the lesser tuberosity and identified it to be a very poor quality.  I was able to expose the entire lesser tuberosity simply with a nerve hook, peeling back the pseudotendon.  A suction shaver was used to decorticate the lesser tuberosity and remove the reactive osteophyte and to debride the portion of the subscapularis that was quite necrotic.  We completed the intra-articular debridement by removing the proximal stump of the biceps with the suction shaver, both an anterior  and anterior superior lateral portal to complete a arthroscopic subscapularis repair.  The entire tendon was off of its insertion.  We performed a double fiber tape repair utilizing a 45 degree left suture Lasso to place the inferior suture and a scorpion to place the more superior suture.  With some difficulty due to a short shoulder situation, we ultimately achieved anatomic reconstruction of the subscapularis footprint with the aid of two 4.57 mm swivel locks placed in the inferior and superior aspects of the subscapularis at the anatomic insertion site.  After thorough debridement and hemostasis, the arthroscopic clip was removed from the glenohumeral joint.  It should be noted that the infraspinatus, supraspinatus, and teres minor had intact insertions both on the MRI and on direct inspection.  We then instrumented the bursa with a posterior viewing camera and noted abundant hypertrophic bursitis.  This was debrided with rather prominent bleeding.  Mr. Racca has been taking multiple supplements and likely has impaired platelet function as a consequence of his multiple supplements including flaxseed oil.  The anatomy of the coracoacromial arch was studied.  The coracoacromial ligament was released with cutting cautery followed by hemostasis.  The acromion was leveled to a type 1 morphology.  The capsule of the [facility] joint was excised with the cutting cautery and suction shaver followed by removal of the discus with the suction shaver.  The distal clavicle was  very prominent posteriorly and the distal cm of clavicle was removed arthroscopically with a suction bur.  Hemostasis was achieved with bipolar cautery.  Throughout the entire bursal work, we had quite a bit of bleeding despite a systolic blood pressure ranging between 90 and 105 systolic.  Clearly Mr. Hayman has some platelet function issues with the supplements.  Ultimately a very satisfactory sculpting of the acromion and  clavicle was achieved.  We inspected the cuff and found no defects except for our cannula portal used to repair the subscapularis.  The arthroscopic clip was removed and the portals were repaired with subcutaneous suture of 3-0 Vicryl and intradermal 3-0 Prolene.  Mr. Rumpf was placed in a sling, awakened from general anesthesia and transferred to the recovery room in stable signs.  We will see him back for followup in our office in 3 days.  We will monitor his vital signs overnight in the recovery care center.  We will continue his routine medications and provide analgesics in the form of p.o. and IV Dilaudid as well as prophylactic antibiotics in form of Ancef 2 g IV q.6 hours x3 doses.     Katy Fitch Jaquil Todt, M.D.     RVS/MEDQ  D:  02/23/2012  T:  02/24/2012  Job:  [id number]

## 2012-02-26 DIAGNOSIS — M75120 Complete rotator cuff tear or rupture of unspecified shoulder, not specified as traumatic: Secondary | ICD-10-CM | POA: Diagnosis not present

## 2012-02-29 DIAGNOSIS — M75120 Complete rotator cuff tear or rupture of unspecified shoulder, not specified as traumatic: Secondary | ICD-10-CM | POA: Diagnosis not present

## 2012-03-02 DIAGNOSIS — M75120 Complete rotator cuff tear or rupture of unspecified shoulder, not specified as traumatic: Secondary | ICD-10-CM | POA: Diagnosis not present

## 2012-03-07 DIAGNOSIS — M75120 Complete rotator cuff tear or rupture of unspecified shoulder, not specified as traumatic: Secondary | ICD-10-CM | POA: Diagnosis not present

## 2012-03-07 DIAGNOSIS — M19019 Primary osteoarthritis, unspecified shoulder: Secondary | ICD-10-CM | POA: Diagnosis not present

## 2012-03-16 DIAGNOSIS — M75120 Complete rotator cuff tear or rupture of unspecified shoulder, not specified as traumatic: Secondary | ICD-10-CM | POA: Diagnosis not present

## 2012-03-16 DIAGNOSIS — M19019 Primary osteoarthritis, unspecified shoulder: Secondary | ICD-10-CM | POA: Diagnosis not present

## 2012-03-17 ENCOUNTER — Other Ambulatory Visit: Payer: Self-pay | Admitting: Cardiology

## 2012-04-06 DIAGNOSIS — M75120 Complete rotator cuff tear or rupture of unspecified shoulder, not specified as traumatic: Secondary | ICD-10-CM | POA: Diagnosis not present

## 2012-04-06 DIAGNOSIS — M24119 Other articular cartilage disorders, unspecified shoulder: Secondary | ICD-10-CM | POA: Diagnosis not present

## 2012-04-18 ENCOUNTER — Ambulatory Visit: Payer: Medicare Other | Admitting: Internal Medicine

## 2012-04-18 DIAGNOSIS — M75120 Complete rotator cuff tear or rupture of unspecified shoulder, not specified as traumatic: Secondary | ICD-10-CM | POA: Diagnosis not present

## 2012-04-18 DIAGNOSIS — M24119 Other articular cartilage disorders, unspecified shoulder: Secondary | ICD-10-CM | POA: Diagnosis not present

## 2012-04-20 DIAGNOSIS — M25569 Pain in unspecified knee: Secondary | ICD-10-CM | POA: Diagnosis not present

## 2012-04-20 DIAGNOSIS — Z23 Encounter for immunization: Secondary | ICD-10-CM | POA: Diagnosis not present

## 2012-04-20 DIAGNOSIS — M1990 Unspecified osteoarthritis, unspecified site: Secondary | ICD-10-CM | POA: Diagnosis present

## 2012-04-20 DIAGNOSIS — N201 Calculus of ureter: Secondary | ICD-10-CM | POA: Diagnosis not present

## 2012-04-20 DIAGNOSIS — E785 Hyperlipidemia, unspecified: Secondary | ICD-10-CM | POA: Diagnosis not present

## 2012-04-20 DIAGNOSIS — S82209A Unspecified fracture of shaft of unspecified tibia, initial encounter for closed fracture: Secondary | ICD-10-CM | POA: Diagnosis not present

## 2012-04-20 DIAGNOSIS — M79609 Pain in unspecified limb: Secondary | ICD-10-CM | POA: Diagnosis not present

## 2012-04-20 DIAGNOSIS — M7989 Other specified soft tissue disorders: Secondary | ICD-10-CM | POA: Diagnosis not present

## 2012-04-20 DIAGNOSIS — IMO0002 Reserved for concepts with insufficient information to code with codable children: Secondary | ICD-10-CM | POA: Diagnosis not present

## 2012-04-20 DIAGNOSIS — T1490XA Injury, unspecified, initial encounter: Secondary | ICD-10-CM | POA: Diagnosis not present

## 2012-04-20 DIAGNOSIS — R4182 Altered mental status, unspecified: Secondary | ICD-10-CM | POA: Diagnosis not present

## 2012-04-20 DIAGNOSIS — X58XXXA Exposure to other specified factors, initial encounter: Secondary | ICD-10-CM | POA: Diagnosis not present

## 2012-04-20 DIAGNOSIS — M25579 Pain in unspecified ankle and joints of unspecified foot: Secondary | ICD-10-CM | POA: Diagnosis not present

## 2012-04-20 DIAGNOSIS — W230XXA Caught, crushed, jammed, or pinched between moving objects, initial encounter: Secondary | ICD-10-CM | POA: Diagnosis not present

## 2012-04-20 DIAGNOSIS — S8990XA Unspecified injury of unspecified lower leg, initial encounter: Secondary | ICD-10-CM | POA: Diagnosis not present

## 2012-04-20 DIAGNOSIS — I10 Essential (primary) hypertension: Secondary | ICD-10-CM | POA: Diagnosis not present

## 2012-04-20 DIAGNOSIS — I2510 Atherosclerotic heart disease of native coronary artery without angina pectoris: Secondary | ICD-10-CM | POA: Diagnosis present

## 2012-04-20 DIAGNOSIS — S8780XA Crushing injury of unspecified lower leg, initial encounter: Secondary | ICD-10-CM | POA: Diagnosis not present

## 2012-04-20 DIAGNOSIS — N179 Acute kidney failure, unspecified: Secondary | ICD-10-CM | POA: Diagnosis not present

## 2012-04-20 DIAGNOSIS — K7689 Other specified diseases of liver: Secondary | ICD-10-CM | POA: Diagnosis not present

## 2012-04-22 ENCOUNTER — Encounter: Payer: Self-pay | Admitting: Internal Medicine

## 2012-04-26 ENCOUNTER — Ambulatory Visit (INDEPENDENT_AMBULATORY_CARE_PROVIDER_SITE_OTHER): Payer: Medicare Other | Admitting: Family Medicine

## 2012-04-26 ENCOUNTER — Encounter: Payer: Self-pay | Admitting: Family Medicine

## 2012-04-26 VITALS — BP 159/91 | HR 61 | Temp 98.4°F | Ht 71.0 in | Wt 218.0 lb

## 2012-04-26 DIAGNOSIS — M25569 Pain in unspecified knee: Secondary | ICD-10-CM | POA: Diagnosis not present

## 2012-04-26 DIAGNOSIS — S8000XA Contusion of unspecified knee, initial encounter: Secondary | ICD-10-CM | POA: Diagnosis not present

## 2012-04-26 DIAGNOSIS — M25561 Pain in right knee: Secondary | ICD-10-CM

## 2012-04-26 DIAGNOSIS — R609 Edema, unspecified: Secondary | ICD-10-CM | POA: Diagnosis not present

## 2012-04-26 DIAGNOSIS — S8001XA Contusion of right knee, initial encounter: Secondary | ICD-10-CM | POA: Insufficient documentation

## 2012-04-26 NOTE — Assessment & Plan Note (Signed)
No fractures. He requests PT now and I ordered this referral today. He has a small amount of dependent ankle edema that has tracked down from knee region since the time of injury (on both sides). No new rx's given today. He wants to call back and make f/u appt to recheck some labs (glucose) and review general medical issues.

## 2012-04-26 NOTE — Progress Notes (Signed)
OFFICE NOTE  04/26/2012  CC:  Chief Complaint  Patient presents with  . Establish Care    transfer from [facility]; severe injury to legs-in hosp in [place] last week (has d/c papers), declined PT at that time and is now having swelling in legs/ankles     HPI: Patient is a 66 y.o. Caucasian male who is here to transfer care due to convenience to his home, asks specifically for PT referral. Six days ago he accidentally had a tractor run over his legs when it came out of gear on a slope--tractor came to rest on right knee region and stopped and he had to be extracted by emergency services. No fractures sustained, but they wanted to get him into PT but he declined at the time.  Now he wants to proceed with this b/c of soreness and swelling lately (esp in ankles).  His swelling is now moving into dependent areas (he was d/c'd from [facility] hosp 3-4 days ago). I reviewed his d/c records from this hospitalization while in office today (all x-rays/CT scans negative for acute injury, CPK trended up and then down prior to d/c--no myoglobinuria.  Glucose 140'ish initially but 113 on day of d/c.  Cr 1.5 initially but 1.01 on day of d/c). Of note, he says his BP is always 120s/70s on home bp checks.    Pertinent PMH:  Past Medical History  Diagnosis Date  . GERD (gastroesophageal reflux disease)   . Heart murmur   . Hyperlipidemia   . Hypertension   . Kidney stone   . Arrhythmia     atrial fibrillation  . Heart valve problem     aortic stenosis, aortic regurgitation (bicuspid aortic valve)  . Shortness of breath     with activities  . CAD (coronary artery disease)     aortic stenosis  . Aortic aneurysm, thoracic     bicuspid aortic valve. aortic stenosis, pt denies anuerysm  . H/O hiatal hernia   . Wears glasses     MEDS:  Outpatient Prescriptions Prior to Visit  Medication Sig Dispense Refill  . atorvastatin (LIPITOR) 40 MG tablet TAKE ONE TABLET BY MOUTH DAILY  90 tablet  0  .  benazepril-hydrochlorthiazide (LOTENSIN HCT) 20-12.5 MG per tablet Take 2 tablets by mouth daily.  60 tablet  11  . omeprazole (PRILOSEC) 40 MG capsule Take 1 capsule (40 mg total) by mouth daily. Office visit due  30 capsule  3  . [DISCONTINUED] Arginine POWD Take by mouth daily.       . [DISCONTINUED] Flaxseed, Linseed, (FLAX SEEDS PO) Take by mouth.         Last reviewed on 04/26/2012  8:53 AM by Jeoffrey Massed, MD  PE: Blood pressure 159/91, pulse 61, temperature 98.4 F (36.9 C), temperature source Temporal, height 5\' 11"  (1.803 m), weight 218 lb (98.884 kg), SpO2 98.00%. Gen: Alert, well appearing.  Patient is oriented to person, place, time, and situation. ENT:  Eyes: no injection, icteris, swelling, or exudate.  EOMI, PERRLA. Nose: no drainage or turbinate edema/swelling.  No injection or focal lesion.  Mouth: lips without lesion/swelling.  Oral mucosa pink and moist.  Dentition intact and without obvious caries or gingival swelling.  Oropharynx without erythema, exudate, or swelling.   Soft palate shows signs of distant uvulectomy. Neck: supple/nontender.  No LAD, mass, or TM.  Carotid pulses 2+ bilaterally, without bruits. CV: RRR, 1-2/6 systolic murmur best heard at cardiac base.  S1 and S2 clear. LUNGS: CTA bilat,  nonlabored resps, good aeration in all lung fields. LEGS: Right anterolateral knee region with superficial abrasion.  No knee swelling or tenderness.  Extension is full.  Flexion goes to 80 degrees on right.  No edema in LE is noted until ankle area, where 1+ pitting is noted.  No calf tenderness.  Left leg: knee without swelling, tenderness, or limitation of ROM.  Left ankle has 1+ pitting edema.    LABS: none today  IMPRESSION AND PLAN:  Transfer pt from [place]:   Contusion of right knee No fractures. He requests PT now and I ordered this referral today. He has a small amount of dependent ankle edema that has tracked down from knee region since the  time of injury (on both sides). No new rx's given today. He wants to call back and make f/u appt to recheck some labs (glucose) and review general medical issues.   An After Visit Summary was printed and given to the patient.   FOLLOW UP: he'll make appt in near future to go over some medical issues/questions/labs

## 2012-04-27 DIAGNOSIS — S83419A Sprain of medial collateral ligament of unspecified knee, initial encounter: Secondary | ICD-10-CM | POA: Diagnosis not present

## 2012-04-30 DIAGNOSIS — S83419A Sprain of medial collateral ligament of unspecified knee, initial encounter: Secondary | ICD-10-CM | POA: Diagnosis not present

## 2012-05-02 DIAGNOSIS — M19019 Primary osteoarthritis, unspecified shoulder: Secondary | ICD-10-CM | POA: Diagnosis not present

## 2012-05-02 DIAGNOSIS — M24119 Other articular cartilage disorders, unspecified shoulder: Secondary | ICD-10-CM | POA: Diagnosis not present

## 2012-05-02 DIAGNOSIS — M75120 Complete rotator cuff tear or rupture of unspecified shoulder, not specified as traumatic: Secondary | ICD-10-CM | POA: Diagnosis not present

## 2012-05-03 DIAGNOSIS — M1710 Unilateral primary osteoarthritis, unspecified knee: Secondary | ICD-10-CM | POA: Diagnosis not present

## 2012-05-03 DIAGNOSIS — S83419A Sprain of medial collateral ligament of unspecified knee, initial encounter: Secondary | ICD-10-CM | POA: Diagnosis not present

## 2012-05-07 DIAGNOSIS — S83419A Sprain of medial collateral ligament of unspecified knee, initial encounter: Secondary | ICD-10-CM | POA: Diagnosis not present

## 2012-05-09 ENCOUNTER — Telehealth: Payer: Self-pay | Admitting: Family Medicine

## 2012-05-09 NOTE — Telephone Encounter (Signed)
Pls call pt and tell him I got correspondence from his PT, Dewaine Oats, and he recommended ortho referral for further eval of possible ligamentous damage in his right knee.  See if this is ok with him and if so, does he have an orthopedist in mind or does he want me to pick one.  Let me know-thx

## 2012-05-10 NOTE — Telephone Encounter (Signed)
Pt has seen Dr. Valentina Gu for injury.  He is in knee brace and will return to see him in one month.

## 2012-05-16 DIAGNOSIS — S83419A Sprain of medial collateral ligament of unspecified knee, initial encounter: Secondary | ICD-10-CM | POA: Diagnosis not present

## 2012-05-16 DIAGNOSIS — M75120 Complete rotator cuff tear or rupture of unspecified shoulder, not specified as traumatic: Secondary | ICD-10-CM | POA: Diagnosis not present

## 2012-05-16 DIAGNOSIS — M19019 Primary osteoarthritis, unspecified shoulder: Secondary | ICD-10-CM | POA: Diagnosis not present

## 2012-05-20 DIAGNOSIS — S83419A Sprain of medial collateral ligament of unspecified knee, initial encounter: Secondary | ICD-10-CM | POA: Diagnosis not present

## 2012-05-23 DIAGNOSIS — M75120 Complete rotator cuff tear or rupture of unspecified shoulder, not specified as traumatic: Secondary | ICD-10-CM | POA: Diagnosis not present

## 2012-05-23 DIAGNOSIS — M24119 Other articular cartilage disorders, unspecified shoulder: Secondary | ICD-10-CM | POA: Diagnosis not present

## 2012-05-23 DIAGNOSIS — S83419A Sprain of medial collateral ligament of unspecified knee, initial encounter: Secondary | ICD-10-CM | POA: Diagnosis not present

## 2012-05-23 DIAGNOSIS — M19019 Primary osteoarthritis, unspecified shoulder: Secondary | ICD-10-CM | POA: Diagnosis not present

## 2012-05-26 ENCOUNTER — Telehealth: Payer: Self-pay | Admitting: Family Medicine

## 2012-05-26 ENCOUNTER — Encounter: Payer: Self-pay | Admitting: Family Medicine

## 2012-05-26 ENCOUNTER — Ambulatory Visit (INDEPENDENT_AMBULATORY_CARE_PROVIDER_SITE_OTHER): Payer: Medicare Other | Admitting: Family Medicine

## 2012-05-26 VITALS — BP 147/99 | HR 73 | Temp 97.8°F | Ht 71.0 in | Wt 211.0 lb

## 2012-05-26 DIAGNOSIS — J069 Acute upper respiratory infection, unspecified: Secondary | ICD-10-CM

## 2012-05-26 MED ORDER — IPRATROPIUM BROMIDE 0.03 % NA SOLN: 2.0000 | Freq: Two times a day (BID) | NASAL | Status: DC

## 2012-05-26 NOTE — Telephone Encounter (Signed)
Patient Information:  Caller Name: Thierry  Phone: [phone]  Patient: Samuel Willis, Samuel Willis  Gender: Male  DOB: [birth year]  Age: 66 Years  PCP: Earley Favor [facility])  Office Follow Up:  Does the office need to follow up with this patient?: No  Instructions For The Office: N/A   Symptoms  Reason For Call & Symptoms: Patient onset of sinus congestion three days ago moving into his chest. Cough productive green.  Ongoing coughing . Awake with congestion/cough.  Reviewed Health History In EMR: Yes  Reviewed Medications In EMR: Yes  Reviewed Allergies In EMR: Yes  Reviewed Surgeries / Procedures: No  Date of Onset of Symptoms: 05/23/2012  Treatments Tried: Treatment at home- Sinus Decongestant and Claitin  Treatments Tried Worked: No  Guideline(s) Used:  Sinus Pain and Congestion  Disposition Per Guideline:   See Today or Tomorrow in Office  Reason For Disposition Reached:   Lots of coughing  Advice Given:  Reassurance:   Sinus congestion is a normal part of a cold.  Usually home treatment with nasal washes can prevent an actual bacterial sinus infection.  For a Runny Nose With Profuse Discharge:  Nasal mucus and discharge helps to wash viruses and bacteria out of the nose and sinuses.  Blowing the nose is all that is needed.  For a Stuffy Nose - Use Nasal Washes:  Introduction: Saline (salt water) nasal irrigation (nasal wash) is an effective and simple home remedy for treating stuffy nose and sinus congestion. The nose can be irrigated by pouring, spraying, or squirting salt water into the nose and then letting it run back out.  How it Helps: The salt water rinses out excess mucus, washes out any irritants (dust, allergens) that might be present, and moistens the nasal cavity.  Methods: There are several ways to perform nasal irrigation. You can use a saline nasal spray bottle (available over-the-counter), a rubber ear syringe, a medical syringe without the needle, or  a Neti Pot.  Caution - Nasal Decongestants:  Do not take these medications if you have high blood pressure, heart disease, prostate problems, or an overactive thyroid.  Hydration:  Drink plenty of liquids (6-8 glasses of water daily). If the air in your home is dry, use a cool mist humidifier  Call Back If:   Severe pain lasts longer than 2 hours after pain medicine  Sinus pain lasts longer than 1 day after starting treatment using nasal washes  You become worse.  Appointment Scheduled:  05/26/2012 10:15:00 Appointment Scheduled Provider:  E[facility]ley Favor [facility])

## 2012-05-26 NOTE — Telephone Encounter (Signed)
Noted  

## 2012-05-26 NOTE — Progress Notes (Signed)
2nd attempt to eRX nasal spray was unsuccessful.  Called to Katrina at pharmacy.

## 2012-05-26 NOTE — Assessment & Plan Note (Signed)
Atrovent 0.3% nasal spray q12h prn. Continue nonsedating antihistamine but AVOID decongestants. Saline nasal spray discussed. Discussed signs/symptoms to call or return for.

## 2012-05-26 NOTE — Progress Notes (Signed)
OFFICE NOTE  05/26/2012  CC:  Chief Complaint  Patient presents with  . URI    sinus pain and pressure and cough x 3 days; no fever     HPI: Patient is a 66 y.o. Caucasian male who is here for respiratory complaints. Pt presents complaining of respiratory symptoms for 3 days.  Primary symptoms are: nasal congestion, PND, coughing-productive.  No chest pain or wheezing.  Worst symptoms seems to be the nasal/sinus congestion and PND.  Cough is minimal per pt.  Lately the symptoms seem to be staying stable.  Pertinent negatives: No fevers, no wheezing, and no SOB.  No pain in face or teeth.  No significant HA.  No ST. Symptoms made worse by nothing.  Symptoms improved by nothing (took claritin D today). Smoker? Former, quit 20 yrs ago Recent sick contact? None known Muscle or joint aches? no Flu shot this season at least 2 wks ago? yes  Additional ROS: no n/v/d or abdominal pain.  No rash.  No neck stiffness.   +Mild fatigue.  +Mild appetite loss.  His right knee is improving with PT.  He saw orthopedist and he was told he had a strained ACL and MCL, no distinct tear.  No MRI was done.  Pertinent PMH:  Past Medical History  Diagnosis Date  . GERD (gastroesophageal reflux disease)   . Heart murmur   . Hyperlipidemia   . Hypertension   . Nephrolithiasis   . Arrhythmia     atrial fibrillation  . Heart valve problem     aortic stenosis, aortic regurgitation (bicuspid aortic valve)  . Shortness of breath     with activities  . CAD (coronary artery disease)     aortic stenosis  . Aortic aneurysm, thoracic     bicuspid aortic valve. aortic stenosis, pt denies anuerysm  . H/O hiatal hernia   . Wears glasses   . Fatty liver 04/2012    Noted on noncontrast abd CT done during trauma w/u when tractor rolled onto his knee    MEDS:  Outpatient Prescriptions Prior to Visit  Medication Sig Dispense Refill  . Arginine 500 MG CAPS Take 1 capsule by mouth daily.      Marland Kitchen atorvastatin  (LIPITOR) 40 MG tablet TAKE ONE TABLET BY MOUTH DAILY  90 tablet  0  . benazepril-hydrochlorthiazide (LOTENSIN HCT) 20-12.5 MG per tablet Take 2 tablets by mouth daily.  60 tablet  11  . omeprazole (PRILOSEC) 40 MG capsule Take 1 capsule (40 mg total) by mouth daily. Office visit due  30 capsule  3   Last reviewed on 05/26/2012 10:59 AM by Jeoffrey Massed, MD  PE: Blood pressure 147/99, pulse 73, temperature 97.8 F (36.6 C), temperature source Temporal, height 5\' 11"  (1.803 m), weight 211 lb (95.709 kg), SpO2 97.00%. VS: noted--normal. Gen: alert, NAD, WELL- APPEARING. HEENT: eyes without injection, drainage, or swelling.  Ears: EACs clear, TMs with normal light reflex and landmarks.  Nose: Clear rhinorrhea, with some dried, crusty exudate adherent to mildly injected mucosa.  No purulent d/c.  No paranasal sinus TTP.  No facial swelling.  Throat and mouth without focal lesion.  No pharyngial swelling, erythema, or exudate.   Neck: supple, no LAD.   LUNGS: CTA bilat, nonlabored resps.   CV: IRRR, occasional ectopy, rate 70s or so, soft systolic ejection murmur at RUSB.  No rub or gallop. EXT: no c/c/e SKIN: no rash  LAB: none today  IMPRESSION AND PLAN:  Viral URI Atrovent  0.3% nasal spray q12h prn. Continue nonsedating antihistamine but AVOID decongestants. Saline nasal spray discussed. Discussed signs/symptoms to call or return for.   An After Visit Summary was printed and given to the patient.  FOLLOW UP: prn

## 2012-05-26 NOTE — Addendum Note (Signed)
Addended by: Francee Piccolo C on: 05/26/2012 11:41 AM   Modules accepted: Orders

## 2012-05-27 DIAGNOSIS — S83419A Sprain of medial collateral ligament of unspecified knee, initial encounter: Secondary | ICD-10-CM | POA: Diagnosis not present

## 2012-05-30 DIAGNOSIS — M75120 Complete rotator cuff tear or rupture of unspecified shoulder, not specified as traumatic: Secondary | ICD-10-CM | POA: Diagnosis not present

## 2012-05-30 DIAGNOSIS — M24119 Other articular cartilage disorders, unspecified shoulder: Secondary | ICD-10-CM | POA: Diagnosis not present

## 2012-05-30 DIAGNOSIS — M19019 Primary osteoarthritis, unspecified shoulder: Secondary | ICD-10-CM | POA: Diagnosis not present

## 2012-05-30 DIAGNOSIS — M67919 Unspecified disorder of synovium and tendon, unspecified shoulder: Secondary | ICD-10-CM | POA: Diagnosis not present

## 2012-05-30 DIAGNOSIS — S83419A Sprain of medial collateral ligament of unspecified knee, initial encounter: Secondary | ICD-10-CM | POA: Diagnosis not present

## 2012-06-01 ENCOUNTER — Other Ambulatory Visit: Payer: Self-pay | Admitting: *Deleted

## 2012-06-01 MED ORDER — OMEPRAZOLE 40 MG PO CPDR: 40.0000 mg | DELAYED_RELEASE_CAPSULE | Freq: Every day | ORAL | Status: DC

## 2012-06-01 NOTE — Telephone Encounter (Signed)
Faxed refill request received from pharmacy for OMEPRAZOLE Last seen on 05/26/12 Refill sent per [facility] refill protocol.

## 2012-06-03 DIAGNOSIS — S83419A Sprain of medial collateral ligament of unspecified knee, initial encounter: Secondary | ICD-10-CM | POA: Diagnosis not present

## 2012-06-10 DIAGNOSIS — S83419A Sprain of medial collateral ligament of unspecified knee, initial encounter: Secondary | ICD-10-CM | POA: Diagnosis not present

## 2012-06-17 DIAGNOSIS — M75120 Complete rotator cuff tear or rupture of unspecified shoulder, not specified as traumatic: Secondary | ICD-10-CM | POA: Diagnosis not present

## 2012-06-17 DIAGNOSIS — M67919 Unspecified disorder of synovium and tendon, unspecified shoulder: Secondary | ICD-10-CM | POA: Diagnosis not present

## 2012-06-17 DIAGNOSIS — M719 Bursopathy, unspecified: Secondary | ICD-10-CM | POA: Diagnosis not present

## 2012-06-17 DIAGNOSIS — S83419A Sprain of medial collateral ligament of unspecified knee, initial encounter: Secondary | ICD-10-CM | POA: Diagnosis not present

## 2012-06-24 DIAGNOSIS — S83419A Sprain of medial collateral ligament of unspecified knee, initial encounter: Secondary | ICD-10-CM | POA: Diagnosis not present

## 2012-06-27 DIAGNOSIS — M67919 Unspecified disorder of synovium and tendon, unspecified shoulder: Secondary | ICD-10-CM | POA: Diagnosis not present

## 2012-06-27 DIAGNOSIS — M719 Bursopathy, unspecified: Secondary | ICD-10-CM | POA: Diagnosis not present

## 2012-06-27 DIAGNOSIS — M75120 Complete rotator cuff tear or rupture of unspecified shoulder, not specified as traumatic: Secondary | ICD-10-CM | POA: Diagnosis not present

## 2012-06-30 ENCOUNTER — Other Ambulatory Visit: Payer: Self-pay | Admitting: Cardiology

## 2012-07-01 DIAGNOSIS — S83419A Sprain of medial collateral ligament of unspecified knee, initial encounter: Secondary | ICD-10-CM | POA: Diagnosis not present

## 2012-07-11 DIAGNOSIS — S83419A Sprain of medial collateral ligament of unspecified knee, initial encounter: Secondary | ICD-10-CM | POA: Diagnosis not present

## 2012-09-28 ENCOUNTER — Other Ambulatory Visit: Payer: Self-pay | Admitting: Cardiology

## 2012-09-29 ENCOUNTER — Other Ambulatory Visit: Payer: Self-pay | Admitting: *Deleted

## 2012-09-29 MED ORDER — OMEPRAZOLE 40 MG PO CPDR: 40.0000 mg | DELAYED_RELEASE_CAPSULE | Freq: Every day | ORAL | Status: DC

## 2012-09-29 NOTE — Telephone Encounter (Signed)
Faxed refill request received from pharmacy for OMEPRAZOLE Last filled by MD on 06/01/12, #30 X 5 Last seen on 05/26/13 Follow up not noted Refill sent per [facility] refill protocol.

## 2012-10-07 ENCOUNTER — Encounter: Payer: Self-pay | Admitting: Internal Medicine

## 2012-11-07 ENCOUNTER — Other Ambulatory Visit: Payer: Self-pay | Admitting: Cardiology

## 2012-12-08 ENCOUNTER — Other Ambulatory Visit: Payer: Self-pay | Admitting: Cardiology

## 2012-12-13 DIAGNOSIS — D235 Other benign neoplasm of skin of trunk: Secondary | ICD-10-CM | POA: Diagnosis not present

## 2012-12-13 DIAGNOSIS — L570 Actinic keratosis: Secondary | ICD-10-CM | POA: Diagnosis not present

## 2012-12-21 ENCOUNTER — Ambulatory Visit (INDEPENDENT_AMBULATORY_CARE_PROVIDER_SITE_OTHER): Payer: Medicare Other | Admitting: Family Medicine

## 2012-12-21 ENCOUNTER — Encounter: Payer: Self-pay | Admitting: Family Medicine

## 2012-12-21 VITALS — BP 120/74 | HR 59 | Temp 98.1°F | Resp 16 | Ht 71.0 in | Wt 199.0 lb

## 2012-12-21 DIAGNOSIS — N182 Chronic kidney disease, stage 2 (mild): Secondary | ICD-10-CM | POA: Diagnosis not present

## 2012-12-21 DIAGNOSIS — E785 Hyperlipidemia, unspecified: Secondary | ICD-10-CM

## 2012-12-21 DIAGNOSIS — R7301 Impaired fasting glucose: Secondary | ICD-10-CM

## 2012-12-21 DIAGNOSIS — I10 Essential (primary) hypertension: Secondary | ICD-10-CM | POA: Diagnosis not present

## 2012-12-21 LAB — COMPREHENSIVE METABOLIC PANEL
ALT: 30 U/L (ref 0–53)
AST: 24 U/L (ref 0–37)
Alkaline Phosphatase: 53 U/L (ref 39–117)
CO2: 26 mEq/L (ref 19–32)
Creatinine, Ser: 1.3 mg/dL (ref 0.4–1.5)
GFR: 57.61 mL/min — ABNORMAL LOW (ref >60.00)
Sodium: 141 mEq/L (ref 135–145)
Total Bilirubin: 0.9 mg/dL (ref 0.3–1.2)
Total Protein: 6.8 g/dL (ref 6.0–8.3)

## 2012-12-21 LAB — LIPID PANEL
HDL: 49.1 mg/dL (ref >39.00)
LDL Cholesterol: 71 mg/dL (ref 0–99)
Total CHOL/HDL Ratio: 3

## 2012-12-21 MED ORDER — BENAZEPRIL-HYDROCHLOROTHIAZIDE 20-12.5 MG PO TABS: ORAL_TABLET | ORAL | Status: DC

## 2012-12-21 MED ORDER — OMEPRAZOLE 40 MG PO CPDR: 40.0000 mg | DELAYED_RELEASE_CAPSULE | Freq: Every day | ORAL | Status: DC

## 2012-12-21 NOTE — Progress Notes (Addendum)
OFFICE NOTE  12/21/2012  CC:  Chief Complaint  Patient presents with  . Cerumen Impaction  . Medication Refill     HPI: Patient is a 67 y.o. Caucasian male who is here for suspected cerumen impactions/med RF. Has had gradual feeling of bilat ear stuffiness.  Hx of cerumen impactions.  Has hearing aids but doesn't wear them, says he needs to return to the audiologist.    Says he is otherwise feeling great, although when he walks vigorously and jogs he says it "takes a lot out of me" but then he recovers within a minute or two.  No Chest pain, no dizziness. Home BPs 130s/70s. Says omeprazole working great for his GERD. Hx of IFG: HbA1c was 6.3% 10/2011. Last CBC and chemistry panel were fine 04/2012 (Cr 1.52). Last lipids were good 10/2011.   Pertinent PMH:  Past Medical History  Diagnosis Date  . GERD (gastroesophageal reflux disease)   . Heart murmur   . Hyperlipidemia   . Hypertension   . Nephrolithiasis   . Arrhythmia     atrial fibrillation  . Heart valve problem     aortic stenosis, aortic regurgitation (bicuspid aortic valve)  . Shortness of breath     with activities  . CAD (coronary artery disease)     aortic stenosis  . Aortic aneurysm, thoracic     bicuspid aortic valve. aortic stenosis, pt denies anuerysm  . H/O hiatal hernia   . Wears glasses   . Fatty liver 04/2012    Noted on noncontrast abd CT done during trauma w/u when tractor rolled onto his knee   Past Surgical History  Procedure Laterality Date  . Esophagogastroduodenoscopy  02/23/2011    Barrett's esophagus, hiatal hernia - dilated for dysphagia  . Colonoscopy    . Tendon repair  11/06/2011    Procedure: TENDON REPAIR;  Surgeon: Wyn Forster., MD;  Location: [facility];  Service: Orthopedics;  Laterality: Left;  explore/repair tendons left hand   . Cardiac catheterization  06/24/10  . Circumcision  01/18/2012    Procedure: CIRCUMCISION ADULT;  Surgeon: Valetta Fuller, MD;   Location: [facility];  Service: Urology;  Laterality: N/A;  30 mins requested for this case   . Shoulder surgery      Arthroscopic 02/23/12    MEDS:  Outpatient Prescriptions Prior to Visit  Medication Sig Dispense Refill  . atorvastatin (LIPITOR) 40 MG tablet TAKE ONE TABLET BY MOUTH ONCE DAILY-NEEDS TO BE SEEN  15 tablet  0  . benazepril-hydrochlorthiazide (LOTENSIN HCT) 20-12.5 MG per tablet TAKE TWO TABLETS BY MOUTH ONCE DAILY-NEEDS TO BE SEEN  30 tablet  0  . omeprazole (PRILOSEC) 40 MG capsule Take 1 capsule (40 mg total) by mouth daily.  30 capsule  5  . Arginine 500 MG CAPS Take 1 capsule by mouth daily.      Marland Kitchen ipratropium (ATROVENT) 0.03 % nasal spray Place 2 sprays into the nose every 12 (twelve) hours.  30 mL  3   No facility-administered medications prior to visit.    PE: Blood pressure 120/74, pulse 59, temperature 98.1 F (36.7 C), temperature source Oral, resp. rate 16, height 5\' 11"  (1.803 m), weight 199 lb (90.266 kg), SpO2 95.00%. ENT: Ears: EACs clear, normal epithelium.  TMs with good light reflex and landmarks bilaterally.  Eyes: no injection, icteris, swelling, or exudate.  EOMI, PERRLA. Nose: no drainage or turbinate edema/swelling.  No injection or focal lesion.  Mouth: lips  without lesion/swelling.  Oral mucosa pink and moist.  Dentition intact and without obvious caries or gingival swelling.  Oropharynx without erythema, exudate, or swelling.  CV: RRR, 1/6 systolic ejection murmur heard best at RUSB.  No r/g.   LUNGS: CTA bilat, nonlabored resps, good aeration in all lung fields.   IMPRESSION AND PLAN:  1) Ear stuffiness--no cerumen today.  Needs to f/u with his audiologist. 2) Hx of IFG: Check fasting gluc and HbA1c. 3) Hyperlipidemia: Tolerating statin.  Check FLP. 4) CRI: lytes/cr recheck. 5) CAD/bicuspid aortic valve/Hx of thoracic aortic aneurism/hx of a-fib---keep approp f/u with Dr. Shirlee Latch (cardiologist).  FOLLOW UP:   6 mo f/u HTN,  bicuspid aortic valve, hyperlip, CRI, IFG

## 2012-12-26 ENCOUNTER — Other Ambulatory Visit: Payer: Self-pay | Admitting: Cardiology

## 2012-12-27 ENCOUNTER — Other Ambulatory Visit: Payer: Self-pay | Admitting: Family Medicine

## 2012-12-27 NOTE — Telephone Encounter (Signed)
Got a refill request from pharmacy.  Dr Shirlee Latch already took care of rx.

## 2012-12-29 ENCOUNTER — Telehealth: Payer: Self-pay | Admitting: Family Medicine

## 2012-12-30 MED ORDER — ATORVASTATIN CALCIUM 40 MG PO TABS: ORAL_TABLET | ORAL | Status: DC

## 2012-12-30 NOTE — Telephone Encounter (Signed)
Patient requesting lisinopril.  He has a cardiologist, should he be filling it or will you continue to fill medication?

## 2013-01-18 ENCOUNTER — Other Ambulatory Visit: Payer: Self-pay

## 2013-02-15 ENCOUNTER — Other Ambulatory Visit: Payer: Self-pay | Admitting: Family Medicine

## 2013-02-15 MED ORDER — BENAZEPRIL-HYDROCHLOROTHIAZIDE 20-12.5 MG PO TABS: ORAL_TABLET | ORAL | Status: DC

## 2013-02-16 NOTE — Telephone Encounter (Signed)
Opened in error

## 2013-03-21 ENCOUNTER — Other Ambulatory Visit: Payer: Self-pay | Admitting: Family Medicine

## 2013-03-21 MED ORDER — BENAZEPRIL-HYDROCHLOROTHIAZIDE 20-12.5 MG PO TABS: ORAL_TABLET | ORAL | Status: DC

## 2013-04-05 ENCOUNTER — Encounter: Payer: Self-pay | Admitting: Cardiology

## 2013-04-05 ENCOUNTER — Ambulatory Visit (INDEPENDENT_AMBULATORY_CARE_PROVIDER_SITE_OTHER): Payer: Medicare Other | Admitting: Cardiology

## 2013-04-05 VITALS — BP 120/78 | HR 62 | Ht 71.0 in | Wt 205.8 lb

## 2013-04-05 DIAGNOSIS — I10 Essential (primary) hypertension: Secondary | ICD-10-CM

## 2013-04-05 DIAGNOSIS — I350 Nonrheumatic aortic (valve) stenosis: Secondary | ICD-10-CM

## 2013-04-05 DIAGNOSIS — E785 Hyperlipidemia, unspecified: Secondary | ICD-10-CM

## 2013-04-05 DIAGNOSIS — I712 Thoracic aortic aneurysm, without rupture: Secondary | ICD-10-CM | POA: Diagnosis not present

## 2013-04-05 DIAGNOSIS — I4891 Unspecified atrial fibrillation: Secondary | ICD-10-CM

## 2013-04-05 DIAGNOSIS — I2510 Atherosclerotic heart disease of native coronary artery without angina pectoris: Secondary | ICD-10-CM | POA: Diagnosis not present

## 2013-04-05 DIAGNOSIS — I359 Nonrheumatic aortic valve disorder, unspecified: Secondary | ICD-10-CM | POA: Diagnosis not present

## 2013-04-05 DIAGNOSIS — Q231 Congenital insufficiency of aortic valve: Secondary | ICD-10-CM

## 2013-04-05 NOTE — Patient Instructions (Signed)
Your physician has requested that you have an echocardiogram. Echocardiography is a painless test that uses sound waves to create images of your heart. It provides your doctor with information about the size and shape of your heart and how well your heart's chambers and valves are working. This procedure takes approximately one hour. There are no restrictions for this procedure. February 2015   Schedule an appointment for an MRA of your chest in February 2015.  Your physician wants you to follow-up in: 1 year with Dr Shirlee Latch. Samuel Willis 2015).  You will receive a reminder letter in the mail two months in advance. If you don't receive a letter, please call our office to schedule the follow-up appointment.

## 2013-04-05 NOTE — Progress Notes (Signed)
Patient ID: Samuel Willis, male   DOB: 08-31-1945, 67 y.o.   MRN: [id number] PCP: Dr. Milinda Cave  67 yo with history of bicuspid aortic valve and mild AS/mild AR as well as rare paroxysmal atrial fibrillation presents for followup. Last echo in 2/13 showed mild LV hypertrophy, EF 60-65%, bicuspid aortic valve with mild AS and mild AI.  MRA aorta in 2012 showed aortic root 3.7 cm and ascending aorta 3.7 cm. Left heart cath in 2012 showed a 60-70% mid LAD stenosis.  ETT-myoview done after this showed good exercise tolerance and no evidence of ischemia or infarction.  Since last appointment, he got his legs trapped under a tractor but actually came out of this without much injury.   Mr Snedden has a history of rare paroxysmal atrial fibrillation.  He has had one episode of tachypalpitations in about 18 months.  He has stable exertional dyspnea.  He is short of breath walking up hills and when he does aerobics classes with his wife.  No dyspnea with walking on flat ground.  No chest pain.  He has not been taking aspirin.  Weight is down 9 lbs since last appointment.  BP is under good control.   ECG: NSR, normal  Labs (12/10): creatinine 1.4  Labs (6/12): K 4.5, creatinine 1.2, LDL 66, HDL 48 Labs (7/14): K 4.5, creatinine 1.3, LDL 71, HDL 49  Allergies (verified):  No Known Drug Allergies   Past Medical History:  1. Paroxysmal atrial fibrillation. Patient has a brief episode of irregular heart beating every 1-2 years. One was documented as atrial fibrillation. I have spoken to the patient about coumadin in the past. His CHADSVASC score is 2 now that CAD has been found. He has had no documented atrial fibrillation in recent years.  2. Hypertension.  3. Hyperlipidemia.  4. CAD: Exercise treadmill Myoview done in January 2010. The patient exercised for 10 minutes and 46 seconds. He stopped due to fatigue. EF was 56%. There was normal perfusion with no evidence for ischemia or infarction. LHC (1/12): 60-70%  mLAD stenosis.  ETT-myoview was then done to assess for ischemia in the LAD territory. Patient exercised 10', EF was 69% with no evidence for ischemia or infarction.   5. Aortic stenosis and aortic regurgitation (bicuspid aortic valve). The patient did have an echocardiogram done on June 26, 2008. EF was 65%. There were no regional wall motion abnormalities. There was moderate aortic regurgitation. There was mild aortic stenosis by mean gradient which was 12 mmHg. There was mild ascending aorta and aortic root dilation. There was mild mitral regurgitation, mild left atrial enlargement, and right atrial enlargement. TEE was done, confirming a functionally bicuspid aortic valve with fusion of the right and noncoronary cusps. There was mild aortic stenosis and moderate aortic insufficiency. The ascending aorta was mildly dilated at 3.9 cm. TTE (1/10) showed EF 60% with mild LVH, mild AS (mean gradient 12), mild to moderate AR, normal LV size. TTE (1/12) with EF 60%, mild LVH, moderate diastolic dysfunction, mild AS (mean gradient 11 mmHg), mild AI, normal RV. MRA chest (1/12) with 3.7 cm aortic root and ascending aorta. TTE (2/13): EF 60-65%, mild LVH, mild AS, mild AI, mild MR.  6. Biceps tendon rupture.   Family History:  The patient's mother has a history of stroke, also has questionable history of atrial fibrillation. The patient's father had an MI in his 80s.   Social History:  The patient is married, lives in [place], is a nonsmoker. He owns  several businesses. He is not drinking alcohol. He works out with a Psychologist, educational a couple of days a week and gets a significant amount of cardiovascular exercise.   Review of Systems  All systems reviewed and negative except as per HPI.   Current Outpatient Prescriptions  Medication Sig Dispense Refill  . Arginine 500 MG CAPS Take 1 capsule by mouth daily.      Marland Kitchen atorvastatin (LIPITOR) 40 MG tablet TAKE ONE TABLET BY MOUTH ONCE DAILY  30 tablet  6  .  benazepril-hydrochlorthiazide (LOTENSIN HCT) 20-12.5 MG per tablet TAKE TWO TABLETS BY MOUTH ONCE DAILY  90 tablet  1  . omeprazole (PRILOSEC) 40 MG capsule Take 1 capsule (40 mg total) by mouth daily.  90 capsule  3   No current facility-administered medications for this visit.    BP 120/78  Pulse 62  Ht 5\' 11"  (1.803 m)  Wt 93.35 kg (205 lb 12.8 oz)  BMI 28.72 kg/m2 General: NAD Neck: No JVD, no thyromegaly or thyroid nodule.  Lungs: Clear to auscultation bilaterally with normal respiratory effort. CV: Nondisplaced PMI.  Heart regular S1/S2, no S3/S4, 2/6 SEM RUSB (S2 heard clearly).  No peripheral edema.  No carotid bruit.  Normal pedal pulses.  Abdomen: Soft, nontender, no hepatosplenomegaly, no distention.   Neurologic: Alert and oriented x 3.  Psych: Normal affect. Extremities: No clubbing or cyanosis.   Assessment/Plan: 1. Bicuspid aortic valve disorder: Mild AS and mild AI on last echo.  Mild dilation of ascending aorta on MRA.  I will arrange for echo to reassess aortic valve and MRA chest to assess ascending aorta in 2/15 (2 years after the previous study).   2. CAD: Nonobstructive on prior cath.  He is on a statin.  He is not taking aspirin.  I suggested to him again that it would be a good idea to take aspirin 81 mg daily.  However, he does not want to take it since he is taking an arginine supplement which he says makes him bleed.  3. Hyperlipidemia: Excellent lipids in 7/14 on current statin.  4. HTN: Good control on current meds.  5. Atrial fibrillation: No documented recurrence for years.  He has very rare tachypalpitations.  If atrial fibrillation is documented again, he will need anticoagulation.   Marca Ancona 04/05/2013

## 2013-04-10 ENCOUNTER — Other Ambulatory Visit: Payer: Self-pay | Admitting: *Deleted

## 2013-04-10 DIAGNOSIS — I10 Essential (primary) hypertension: Secondary | ICD-10-CM

## 2013-04-10 DIAGNOSIS — I712 Thoracic aortic aneurysm, without rupture: Secondary | ICD-10-CM

## 2013-04-10 DIAGNOSIS — I359 Nonrheumatic aortic valve disorder, unspecified: Secondary | ICD-10-CM

## 2013-04-20 ENCOUNTER — Other Ambulatory Visit: Payer: Self-pay

## 2013-06-15 DIAGNOSIS — R7303 Prediabetes: Secondary | ICD-10-CM

## 2013-06-15 DIAGNOSIS — N183 Chronic kidney disease, stage 3 unspecified: Secondary | ICD-10-CM

## 2013-06-15 HISTORY — DX: Chronic kidney disease, stage 3 unspecified: N18.30

## 2013-06-15 HISTORY — DX: Prediabetes: R73.03

## 2013-06-26 ENCOUNTER — Ambulatory Visit (INDEPENDENT_AMBULATORY_CARE_PROVIDER_SITE_OTHER): Payer: Medicare Other | Admitting: Family Medicine

## 2013-06-26 ENCOUNTER — Encounter: Payer: Self-pay | Admitting: Family Medicine

## 2013-06-26 VITALS — BP 104/71 | HR 73 | Temp 97.9°F | Resp 18 | Ht 71.0 in | Wt 212.0 lb

## 2013-06-26 DIAGNOSIS — R7301 Impaired fasting glucose: Secondary | ICD-10-CM | POA: Diagnosis not present

## 2013-06-26 DIAGNOSIS — E1169 Type 2 diabetes mellitus with other specified complication: Secondary | ICD-10-CM | POA: Insufficient documentation

## 2013-06-26 DIAGNOSIS — E785 Hyperlipidemia, unspecified: Secondary | ICD-10-CM

## 2013-06-26 DIAGNOSIS — Z77090 Contact with and (suspected) exposure to asbestos: Secondary | ICD-10-CM | POA: Diagnosis not present

## 2013-06-26 DIAGNOSIS — Z23 Encounter for immunization: Secondary | ICD-10-CM | POA: Diagnosis not present

## 2013-06-26 DIAGNOSIS — R062 Wheezing: Secondary | ICD-10-CM

## 2013-06-26 DIAGNOSIS — I10 Essential (primary) hypertension: Secondary | ICD-10-CM

## 2013-06-26 LAB — LIPID PANEL
Cholesterol: 109 mg/dL (ref 0–200)
HDL: 36 mg/dL — AB (ref >39.00)
LDL Cholesterol: 58 mg/dL (ref 0–99)
TRIGLYCERIDES: 73 mg/dL (ref 0.0–149.0)
Total CHOL/HDL Ratio: 3
VLDL: 14.6 mg/dL (ref 0.0–40.0)

## 2013-06-26 LAB — COMPREHENSIVE METABOLIC PANEL
ALT: 50 U/L (ref 0–53)
AST: 22 U/L (ref 0–37)
Albumin: 4.2 g/dL (ref 3.5–5.2)
Alkaline Phosphatase: 34 U/L — ABNORMAL LOW (ref 39–117)
BUN: 36 mg/dL — AB (ref 6–23)
CALCIUM: 9.4 mg/dL (ref 8.4–10.5)
CHLORIDE: 105 meq/L (ref 96–112)
CO2: 29 meq/L (ref 19–32)
CREATININE: 1.5 mg/dL (ref 0.4–1.5)
GFR: 48.15 mL/min — ABNORMAL LOW (ref >60.00)
Glucose, Bld: 99 mg/dL (ref 70–99)
Potassium: 4.8 mEq/L (ref 3.5–5.1)
Sodium: 141 mEq/L (ref 135–145)
Total Bilirubin: 0.7 mg/dL (ref 0.3–1.2)
Total Protein: 7 g/dL (ref 6.0–8.3)

## 2013-06-26 LAB — HEMOGLOBIN A1C: Hgb A1c MFr Bld: 6.4 % (ref 4.6–6.5)

## 2013-06-26 NOTE — Assessment & Plan Note (Signed)
Pt does report occasional brief wheezing. 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 I ordered noncontrast CT chest today (guidance given by radiologist today, whom I consulted by phone).

## 2013-06-26 NOTE — Progress Notes (Signed)
Pre visit review using our clinic review tool, if applicable. No additional management support is needed unless otherwise documented below in the visit note. 

## 2013-06-26 NOTE — Assessment & Plan Note (Signed)
Check HbA1c and fasting serum glucose today.

## 2013-06-26 NOTE — Assessment & Plan Note (Signed)
The current medical regimen is effective;  continue present plan and medications. Check CMET today.

## 2013-06-26 NOTE — Progress Notes (Signed)
OFFICE NOTE  06/26/2013  CC:  Chief Complaint  Patient presents with  . Follow-up     HPI: Patient is a 68 y.o. Caucasian male who is here for 6 mo f/u HTN, hyperlip, impaired fasting glucose.  Also sees cardiology for bicuspid aortic valve with AS/AI, hx of CAD, and hx of PAF. Feeling well. Working out on treadmill 30 min w/out difficulty. He is not checking his glucose at home.  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  His brother carries dx of mesothelioma.  Pt requests an annual CT of chest to screen for asbestos-related pulm changes.  He also says from time to time he gets some wheezing--nothing prolonged.  He has never been a cigarette smoker.    Pertinent PMH:  Past Medical History  Diagnosis Date  . GERD (gastroesophageal reflux disease)   . Heart murmur   . Hyperlipidemia   . Hypertension   . Nephrolithiasis   . Paroxysmal atrial fibrillation     documented briefly; no recurrence as of 2015; pt needs coumadin if it recurrs  . Heart valve problem     aortic stenosis, aortic regurgitation (bicuspid aortic valve)  . Shortness of breath     with activities  . CAD (coronary artery disease)     LAD dz  . Aortic aneurysm, thoracic     bicuspid aortic valve. aortic stenosis,  . H/O hiatal hernia   . Wears glasses   . Fatty liver 04/2012    Noted on noncontrast abd CT done during trauma w/u when tractor rolled onto his knee   Past surgical, social, and family history reviewed and no changes noted since last office visit.  MEDS:  Outpatient Prescriptions Prior to Visit  Medication Sig Dispense Refill  . atorvastatin (LIPITOR) 40 MG tablet TAKE ONE TABLET BY MOUTH ONCE DAILY  30 tablet  6  . benazepril-hydrochlorthiazide (LOTENSIN HCT) 20-12.5 MG per tablet TAKE TWO TABLETS BY MOUTH ONCE DAILY  90 tablet  1  . omeprazole (PRILOSEC) 40 MG capsule Take 1  capsule (40 mg total) by mouth daily.  90 capsule  3  . Arginine 500 MG CAPS Take 1 capsule by mouth daily.       No facility-administered medications prior to visit.    PE: Blood pressure 104/71, pulse 73, temperature 97.9 F (36.6 C), temperature source Temporal, resp. rate 18, height 5\' 11"  (1.803 m), weight 212 lb (96.163 kg), SpO2 98.00%. Gen: Alert, well appearing.  Patient is oriented to person, place, time, and situation. EVO:JJKK: no injection, icteris, swelling, or exudate.  EOMI, PERRLA. Mouth: lips without lesion/swelling.  Oral mucosa pink and moist. Oropharynx without erythema, exudate, or swelling.  Neck - No masses or thyromegaly or limitation in range of motion CV: RRR, 9-3/8 brief systolic murmur at RUSB.  No rub or gallop. Chest is clear, no wheezing or rales. Normal symmetric air entry throughout both lung fields. No chest wall deformities or tenderness. EXT: no clubbing, cyanosis, or edema.    IMPRESSION AND PLAN:  Impaired fasting glucose Check HbA1c and fasting serum glucose today.  HYPERTENSION, UNSPECIFIED The current medical regimen is effective;  continue present plan and medications. Check CMET today.  HYPERLIPIDEMIA-MIXED The current medical regimen is effective;  continue present plan and medications. Check FLP today.  Asbestos exposure Pt does report occasional brief wheezing. 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 I ordered noncontrast CT chest today (guidance given by radiologist today, whom I consulted by phone).   AS/AI--bicuspid aortic valve/CAD/PAF/hx of thoracic aortic aneurism: pt is followed by cardiology, has plans for 64yr f/u MRA thoracic aorta and transthoracic echo coming up soon.  He states he is happy taking his arginine daily as his anticoagulant and declines use of  aspirin.  FOLLOW UP: 6 mo

## 2013-06-26 NOTE — Assessment & Plan Note (Signed)
The current medical regimen is effective;  continue present plan and medications. Check FLP today.

## 2013-06-27 ENCOUNTER — Telehealth: Payer: Self-pay | Admitting: Family Medicine

## 2013-06-27 ENCOUNTER — Encounter: Payer: Self-pay | Admitting: Family Medicine

## 2013-06-27 ENCOUNTER — Ambulatory Visit (HOSPITAL_BASED_OUTPATIENT_CLINIC_OR_DEPARTMENT_OTHER)
Admission: RE | Admit: 2013-06-27 | Discharge: 2013-06-27 | Disposition: A | Discharge status: Home / Self Care | Payer: Medicare Other | Source: Ambulatory Visit | Attending: Family Medicine | Admitting: Family Medicine

## 2013-06-27 DIAGNOSIS — R011 Cardiac murmur, unspecified: Secondary | ICD-10-CM | POA: Insufficient documentation

## 2013-06-27 DIAGNOSIS — I712 Thoracic aortic aneurysm, without rupture, unspecified: Secondary | ICD-10-CM | POA: Diagnosis not present

## 2013-06-27 DIAGNOSIS — Z77090 Contact with and (suspected) exposure to asbestos: Secondary | ICD-10-CM | POA: Diagnosis not present

## 2013-06-27 DIAGNOSIS — K7689 Other specified diseases of liver: Secondary | ICD-10-CM | POA: Insufficient documentation

## 2013-06-27 DIAGNOSIS — I2510 Atherosclerotic heart disease of native coronary artery without angina pectoris: Secondary | ICD-10-CM | POA: Diagnosis not present

## 2013-06-27 DIAGNOSIS — R062 Wheezing: Secondary | ICD-10-CM

## 2013-06-27 DIAGNOSIS — J438 Other emphysema: Secondary | ICD-10-CM | POA: Insufficient documentation

## 2013-06-27 DIAGNOSIS — I700 Atherosclerosis of aorta: Secondary | ICD-10-CM | POA: Insufficient documentation

## 2013-06-27 NOTE — Telephone Encounter (Signed)
Relevant patient education assigned to patient using Emmi. ° °

## 2013-06-28 ENCOUNTER — Other Ambulatory Visit: Payer: Self-pay | Admitting: Family Medicine

## 2013-06-28 DIAGNOSIS — R7301 Impaired fasting glucose: Secondary | ICD-10-CM

## 2013-07-10 ENCOUNTER — Other Ambulatory Visit: Payer: Self-pay | Admitting: *Deleted

## 2013-07-10 ENCOUNTER — Telehealth: Payer: Self-pay | Admitting: Family Medicine

## 2013-07-10 ENCOUNTER — Other Ambulatory Visit: Payer: Self-pay | Admitting: Family Medicine

## 2013-07-10 MED ORDER — BENAZEPRIL-HYDROCHLOROTHIAZIDE 20-12.5 MG PO TABS: ORAL_TABLET | ORAL | Status: DC

## 2013-07-10 MED ORDER — ATORVASTATIN CALCIUM 40 MG PO TABS: ORAL_TABLET | ORAL | Status: DC

## 2013-07-10 NOTE — Telephone Encounter (Signed)
Patient is going out of town in 2 hours and would like a refill on benazepril-hydrochlorthiazide sent to [facility] on [address] ° °

## 2013-07-10 NOTE — Telephone Encounter (Signed)
Patient is going out of town in 2 hours and would like a refill on benazepril-hydrochlorthiazide sent to [facility] on [address]

## 2013-07-10 NOTE — Telephone Encounter (Signed)
Rx sent 

## 2013-07-17 ENCOUNTER — Encounter: Payer: Self-pay | Admitting: Cardiovascular Disease

## 2013-07-17 ENCOUNTER — Ambulatory Visit (HOSPITAL_BASED_OUTPATIENT_CLINIC_OR_DEPARTMENT_OTHER): Payer: Medicare Other | Admitting: Cardiology

## 2013-07-17 ENCOUNTER — Ambulatory Visit (HOSPITAL_COMMUNITY)
Admission: RE | Admit: 2013-07-17 | Discharge: 2013-07-17 | Disposition: A | Discharge status: Home / Self Care | Payer: Medicare Other | Source: Ambulatory Visit | Attending: Cardiology | Admitting: Cardiology

## 2013-07-17 DIAGNOSIS — I712 Thoracic aortic aneurysm, without rupture, unspecified: Secondary | ICD-10-CM | POA: Diagnosis not present

## 2013-07-17 DIAGNOSIS — I77819 Aortic ectasia, unspecified site: Secondary | ICD-10-CM | POA: Diagnosis not present

## 2013-07-17 DIAGNOSIS — I350 Nonrheumatic aortic (valve) stenosis: Secondary | ICD-10-CM

## 2013-07-17 DIAGNOSIS — I7121 Aneurysm of the ascending aorta, without rupture: Secondary | ICD-10-CM

## 2013-07-17 DIAGNOSIS — I359 Nonrheumatic aortic valve disorder, unspecified: Secondary | ICD-10-CM | POA: Diagnosis not present

## 2013-07-17 MED ORDER — GADOBENATE DIMEGLUMINE 529 MG/ML IV SOLN: 20.0000 mL | Freq: Once | INTRAVENOUS | Status: DC

## 2013-07-17 NOTE — Progress Notes (Signed)
Echo performed. 

## 2013-09-27 DIAGNOSIS — L821 Other seborrheic keratosis: Secondary | ICD-10-CM | POA: Diagnosis not present

## 2013-09-27 DIAGNOSIS — B078 Other viral warts: Secondary | ICD-10-CM | POA: Diagnosis not present

## 2013-09-27 DIAGNOSIS — B079 Viral wart, unspecified: Secondary | ICD-10-CM | POA: Diagnosis not present

## 2013-09-27 DIAGNOSIS — L570 Actinic keratosis: Secondary | ICD-10-CM | POA: Diagnosis not present

## 2013-10-30 ENCOUNTER — Telehealth: Payer: Self-pay | Admitting: Cardiology

## 2013-10-30 NOTE — Telephone Encounter (Signed)
New message          Pt would like to come into the office to talk to dr [name] about setting up an appointment for surgery.

## 2013-11-01 NOTE — Telephone Encounter (Signed)
Spoke with patient about follow up appt with Dr [name]. Pt had MRA chest and echo done Feb 2015--see reports. Pt states he has no new symptoms, including increase in SOB/ lightheadedness/dizziness/syncope,  since seeing Dr [name] 01/2013 and having MRA and echo in Feb 2015. He did have some concerns about lab done (liver and kidney function) at his PCP in January 2015. He is going to follow up his PCP about that. I have scheduled an appt for pt in August 2015. Pt knows to call before that if he has any changes in his cardiac symptoms.

## 2013-11-01 NOTE — Telephone Encounter (Signed)
Follow up:  Pt states he is returning Anne's call

## 2013-11-01 NOTE — Telephone Encounter (Signed)
Unable to hear patient on telephone, pt states he will call back.

## 2013-11-29 ENCOUNTER — Ambulatory Visit (INDEPENDENT_AMBULATORY_CARE_PROVIDER_SITE_OTHER): Payer: Medicare Other | Admitting: Nurse Practitioner

## 2013-11-29 ENCOUNTER — Encounter: Payer: Self-pay | Admitting: Nurse Practitioner

## 2013-11-29 VITALS — BP 139/85 | HR 52 | Temp 97.8°F | Ht 71.0 in | Wt 210.2 lb

## 2013-11-29 DIAGNOSIS — L738 Other specified follicular disorders: Secondary | ICD-10-CM | POA: Diagnosis not present

## 2013-11-29 DIAGNOSIS — L678 Other hair color and hair shaft abnormalities: Secondary | ICD-10-CM | POA: Diagnosis not present

## 2013-11-29 DIAGNOSIS — J3489 Other specified disorders of nose and nasal sinuses: Secondary | ICD-10-CM | POA: Diagnosis not present

## 2013-11-29 MED ORDER — MUPIROCIN 2 % EX OINT: 1.0000 "application " | TOPICAL_OINTMENT | Freq: Two times a day (BID) | CUTANEOUS | Status: DC

## 2013-11-29 NOTE — Patient Instructions (Signed)
Start applying antibiotic cream twice daily. Rinse nose daily with Sinus rinse (Neilmed Sinus Rinse). See ENT specialist for evaluation.

## 2013-11-29 NOTE — Progress Notes (Signed)
Pre visit review using our clinic review tool, if applicable. No additional management support is needed unless otherwise documented below in the visit note. 

## 2013-11-29 NOTE — Progress Notes (Signed)
Subjective:     Samuel Willis is a 68 y.o. male reports sore in nose. Pt states he had scab in nose 3 weeks ago- He "dug" it out. Persistently tender, so 3 da he squeezed area-removed yellow thick material. Pt states today nose is less tender.  History   Social History  . Marital Status: Married    Spouse Name: N/A    Number of Children: 2  . Years of Education: N/A   Occupational History  . Self employed    Social History Main Topics  . Smoking status: Former Smoker    Quit date: 11/03/1993  . Smokeless tobacco: Never Used  . Alcohol Use: No  . Drug Use: No  . Sexual Activity: Not on file   Other Topics Concern  . Not on file   Social History Narrative   Married, 2 daughters.   Orig from [place].   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   No T/A/Ds.             Health Maintenance  Topic Date Due  . Tetanus/tdap  08/07/1965  . Zostavax  08/07/2006  . Pneumococcal Polysaccharide Vaccine Age 8 And Over  08/08/2011  . Colonoscopy  06/16/2012  . Influenza Vaccine  01/13/2014    The following portions of the patient's history were reviewed and updated as appropriate: allergies, current medications, past medical history, past social history, past surgical history and problem list.  Review of Systems Pertinent items are noted in HPI.   Objective:    BP 139/85  Pulse 52  Temp(Src) 97.8 F (36.6 C) (Temporal)  Ht 5\' 11"  (1.803 m)  Wt 210 lb 4 oz (95.369 kg)  BMI 29.34 kg/m2  SpO2 97% General appearance: alert, cooperative, appears stated age and no distress Head: Normocephalic, without obvious abnormality, atraumatic Eyes: negative findings: lids and lashes normal and conjunctivae and sclerae normal Nose: examined with otoscope & ear speculum, scant yellow d/c R nare. no other abnormality observed. Tender to palpation. Lungs: clear to auscultation bilaterally Heart: Systolic murmur heard best at L sternal  border    Assessment:   1. Nasal sore - mupirocin ointment (BACTROBAN) 2 %; Place 1 application into the nose 2 (two) times daily.  Dispense: 22 g; Refill: 0 - Ambulatory referral to ENT Sinus rinse daily     See After Visit Summary for Counseling Recommendations

## 2014-01-17 ENCOUNTER — Other Ambulatory Visit: Payer: Self-pay | Admitting: Family Medicine

## 2014-01-17 MED ORDER — ATORVASTATIN CALCIUM 40 MG PO TABS: ORAL_TABLET | ORAL | Status: DC

## 2014-01-18 ENCOUNTER — Telehealth: Payer: Self-pay | Admitting: Family Medicine

## 2014-01-18 MED ORDER — OMEPRAZOLE 40 MG PO CPDR: 40.0000 mg | DELAYED_RELEASE_CAPSULE | Freq: Every day | ORAL | Status: DC

## 2014-01-18 MED ORDER — BENAZEPRIL-HYDROCHLOROTHIAZIDE 20-12.5 MG PO TABS: ORAL_TABLET | ORAL | Status: DC

## 2014-01-18 NOTE — Telephone Encounter (Signed)
Rx sent to [facility]

## 2014-01-19 ENCOUNTER — Ambulatory Visit: Payer: Medicare Other | Admitting: Cardiology

## 2014-01-26 ENCOUNTER — Encounter: Payer: Self-pay | Admitting: Nurse Practitioner

## 2014-01-26 DIAGNOSIS — J3489 Other specified disorders of nose and nasal sinuses: Secondary | ICD-10-CM | POA: Insufficient documentation

## 2014-03-05 DIAGNOSIS — H2510 Age-related nuclear cataract, unspecified eye: Secondary | ICD-10-CM | POA: Diagnosis not present

## 2014-03-30 ENCOUNTER — Other Ambulatory Visit: Payer: Self-pay | Admitting: Family Medicine

## 2014-03-30 NOTE — Telephone Encounter (Signed)
Denied RX.  Patient was supposed to have a f/u appointment w/ Dr. Anitra Lauth in July.

## 2014-04-03 ENCOUNTER — Other Ambulatory Visit: Payer: Self-pay | Admitting: *Deleted

## 2014-04-03 MED ORDER — OMEPRAZOLE 40 MG PO CPDR: 40.0000 mg | DELAYED_RELEASE_CAPSULE | Freq: Every day | ORAL | Status: DC

## 2014-04-03 MED ORDER — ATORVASTATIN CALCIUM 40 MG PO TABS: ORAL_TABLET | ORAL | Status: DC

## 2014-04-06 ENCOUNTER — Other Ambulatory Visit: Payer: Self-pay | Admitting: *Deleted

## 2014-04-06 MED ORDER — BENAZEPRIL-HYDROCHLOROTHIAZIDE 20-12.5 MG PO TABS: ORAL_TABLET | ORAL | Status: DC

## 2014-04-18 ENCOUNTER — Ambulatory Visit (INDEPENDENT_AMBULATORY_CARE_PROVIDER_SITE_OTHER): Payer: Medicare Other | Admitting: Cardiology

## 2014-04-18 ENCOUNTER — Encounter: Payer: Self-pay | Admitting: Cardiology

## 2014-04-18 VITALS — BP 120/74 | HR 60 | Ht 71.0 in | Wt 218.0 lb

## 2014-04-18 DIAGNOSIS — Q231 Congenital insufficiency of aortic valve: Secondary | ICD-10-CM

## 2014-04-18 DIAGNOSIS — I4891 Unspecified atrial fibrillation: Secondary | ICD-10-CM

## 2014-04-18 DIAGNOSIS — I712 Thoracic aortic aneurysm, without rupture, unspecified: Secondary | ICD-10-CM

## 2014-04-18 DIAGNOSIS — I350 Nonrheumatic aortic (valve) stenosis: Secondary | ICD-10-CM | POA: Diagnosis not present

## 2014-04-18 DIAGNOSIS — I2510 Atherosclerotic heart disease of native coronary artery without angina pectoris: Secondary | ICD-10-CM

## 2014-04-18 LAB — CBC WITH DIFFERENTIAL/PLATELET
Basophils Absolute: 0 10*3/uL (ref 0.0–0.1)
Basophils Relative: 0.5 % (ref 0.0–3.0)
EOS ABS: 0.1 10*3/uL (ref 0.0–0.7)
Eosinophils Relative: 2.4 % (ref 0.0–5.0)
HCT: 41.8 % (ref 39.0–52.0)
HEMOGLOBIN: 13.8 g/dL (ref 13.0–17.0)
Lymphocytes Relative: 20.8 % (ref 12.0–46.0)
Lymphs Abs: 1.3 10*3/uL (ref 0.7–4.0)
MCHC: 33 g/dL (ref 30.0–36.0)
MCV: 93.6 fl (ref 78.0–100.0)
MONOS PCT: 7.5 % (ref 3.0–12.0)
Monocytes Absolute: 0.5 10*3/uL (ref 0.1–1.0)
NEUTROS ABS: 4.2 10*3/uL (ref 1.4–7.7)
Neutrophils Relative %: 68.8 % (ref 43.0–77.0)
Platelets: 191 10*3/uL (ref 150.0–400.0)
RBC: 4.47 Mil/uL (ref 4.22–5.81)
RDW: 13.5 % (ref 11.5–15.5)
WBC: 6.1 10*3/uL (ref 4.0–10.5)

## 2014-04-18 LAB — PROTIME-INR
INR: 1.2 ratio — ABNORMAL HIGH (ref 0.8–1.0)
Prothrombin Time: 12.8 s (ref 9.6–13.1)

## 2014-04-18 LAB — BASIC METABOLIC PANEL
BUN: 24 mg/dL — ABNORMAL HIGH (ref 6–23)
CHLORIDE: 108 meq/L (ref 96–112)
CO2: 22 meq/L (ref 19–32)
Calcium: 9 mg/dL (ref 8.4–10.5)
Creatinine, Ser: 1.3 mg/dL (ref 0.4–1.5)
GFR: 59.45 mL/min — ABNORMAL LOW (ref >60.00)
Glucose, Bld: 103 mg/dL — ABNORMAL HIGH (ref 70–99)
POTASSIUM: 3.9 meq/L (ref 3.5–5.1)
SODIUM: 139 meq/L (ref 135–145)

## 2014-04-18 MED ORDER — METOPROLOL TARTRATE 25 MG PO TABS: ORAL_TABLET | ORAL | Status: DC

## 2014-04-18 NOTE — Patient Instructions (Signed)
Your physician recommends that you have lab work today--BMET/CBCd/PT/INR.  Use metoprolol tartrate (lopressor) 25mg  as needed for palpitations.  You have been referred to Dr [name] for evaluation for [facility] monitor.   Your physician wants you to follow-up in: 6 months with Dr [name]. (May 2016). You will receive a reminder letter in the mail two months in advance. If you don't receive a letter, please call our office to schedule the follow-up appointment.

## 2014-04-19 NOTE — Progress Notes (Signed)
Patient ID: Samuel Willis, male   DOB: 05-Jul-1945, 68 y.o.   MRN: [id number] PCP: Dr. Anitra Lauth  68 yo with history of bicuspid aortic valve and mild AS/mild-moderate AI as well as ?paroxysmal atrial fibrillation presents for followup. Last echo in 2/15 showed EF 60-65%, bicuspid aortic valve with mild AS and mild-moderate AI.  MRA aorta in 2015 showed aortic root 4.1 cm and ascending aorta 4.1 cm. Left heart cath in 2012 showed a 60-70% mid LAD stenosis.  ETT-myoview done after this showed good exercise tolerance and no evidence of ischemia or infarction.     Samuel Willis has a history of occasional palpitations.  Apparently one episode years ago (?2010) was found to be atrial fibrillation, this was in [employer].  He has not had documented atrial fibrillation since that and had only been having very rare palpitations.  However, in the last 2 months, he has had 3 episodes where his heart has raced.  Each time it lasted about 3-4 hours.  HR would get up to 180-200 when he would check it on his BP cuff.  He would get chest pressure (mild) when HR was up that high.  He was never evaluated during one of these episodes.    He has stable exertional dyspnea.  He is short of breath walking up hills or heavy lifting.  No dyspnea with walking on flat ground.  He when pheasant hunting in [place] recently with no exertional problems.  He can walk for 30 minutes on the treadmill at the [facility] without problems.  No exertional chest pain.  He has not been taking aspirin because of his arginine supplement.  BP is controlled.   ECG: NSR, LAFB  Labs (12/10): creatinine 1.4  Labs (6/12): K 4.5, creatinine 1.2, LDL 66, HDL 48 Labs (7/14): K 4.5, creatinine 1.3, LDL 71, HDL 49 Labs (1/15): K 4.8, creatinine 1.5, LDL 58, HDL 36  Allergies (verified):  No Known Drug Allergies   Past Medical History:  1. Paroxysmal atrial fibrillation. Patient has a brief episode of irregular heart beating every 1-2 years. One was  apparently documented as atrial fibrillation years ago in [employer]. His CHADSVASC score is 3 now that CAD has been found. He has had no documented atrial fibrillation in recent years.  2. Hypertension.  3. Hyperlipidemia.  4. CAD: Exercise treadmill Myoview done in January 2010. The patient exercised for 10 minutes and 46 seconds. He stopped due to fatigue. EF was 56%. There was normal perfusion with no evidence for ischemia or infarction. LHC (1/12): 60-70% mLAD stenosis.  ETT-myoview was then done to assess for ischemia in the LAD territory. Patient exercised 10', EF was 69% with no evidence for ischemia or infarction.   5. Aortic stenosis and aortic regurgitation (bicuspid aortic valve). The patient did have an echocardiogram done on June 26, 2008. EF was 65%. There were no regional wall motion abnormalities. There was moderate aortic regurgitation. There was mild aortic stenosis by mean gradient which was 12 mmHg. There was mild ascending aorta and aortic root dilation. There was mild mitral regurgitation, mild left atrial enlargement, and right atrial enlargement. TEE was done, confirming a functionally bicuspid aortic valve with fusion of the right and noncoronary cusps. There was mild aortic stenosis and moderate aortic insufficiency. The ascending aorta was mildly dilated at 3.9 cm. TTE (1/10) showed EF 60% with mild LVH, mild AS (mean gradient 12), mild to moderate AR, normal LV size. TTE (1/12) with EF 60%, mild LVH,  moderate diastolic dysfunction, mild AS (mean gradient 11 mmHg), mild AI, normal RV. MRA chest (1/12) with 3.7 cm aortic root and ascending aorta. TTE (2/13): EF 60-65%, mild LVH, mild AS, mild AI, mild Samuel.  Echo (2/15) with EF 60-65%, bicuspid aortic valve with mild aortic stenosis and mild to moderate AI, mild Samuel.  MRA chest (2/15) with 4.1 cm aortic root and 4.1 cm ascending aorta.  6. Biceps tendon rupture.  7. CKD  Family History:  The patient's mother has a history of  stroke, also has questionable history of atrial fibrillation. The patient's father had an MI in his 71s.   Social History:  The patient is married, lives in [place], is a nonsmoker. He owns several businesses. He is not drinking alcohol. He works out with a Clinical research associate a couple of days a week and gets a significant amount of cardiovascular exercise.   Review of Systems  All systems reviewed and negative except as per HPI.   Current Outpatient Prescriptions  Medication Sig Dispense Refill  . Arginine 500 MG CAPS Take 1 capsule by mouth daily.    Marland Kitchen atorvastatin (LIPITOR) 40 MG tablet TAKE ONE TABLET BY MOUTH ONCE DAILY 90 tablet 1  . benazepril-hydrochlorthiazide (LOTENSIN HCT) 20-12.5 MG per tablet TAKE TWO TABLETS BY MOUTH ONCE DAILY 180 tablet 0  . omeprazole (PRILOSEC) 40 MG capsule Take 1 capsule (40 mg total) by mouth daily. 90 capsule 1  . metoprolol tartrate (LOPRESSOR) 25 MG tablet 1 tablet as needed for palpitations 30 tablet 1   No current facility-administered medications for this visit.    BP 120/74 mmHg  Pulse 60  Ht 5\' 11"  (1.803 m)  Wt 218 lb (98.884 kg)  BMI 30.42 kg/m2 General: NAD Neck: No JVD, no thyromegaly or thyroid nodule.  Lungs: Clear to auscultation bilaterally with normal respiratory effort. CV: Nondisplaced PMI.  Heart regular S1/S2, no S3/S4, 2/6 SEM RUSB (S2 heard clearly).  No peripheral edema.  No carotid bruit.  Normal pedal pulses.  Abdomen: Soft, nontender, no hepatosplenomegaly, no distention.   Neurologic: Alert and oriented x 3.  Psych: Normal affect. Extremities: No clubbing or cyanosis.   Assessment/Plan: 1. Bicuspid aortic valve disorder: Mild AS and mild-moderate AI on last echo.  Mild dilation of ascending aorta on MRA (4.1 cm).  I will arrange for echo to reassess aortic valve and MRA chest to assess ascending aorta in 2/17 (2 years after the previous study) unless symptoms change.   2. CAD: Nonobstructive on prior cath.  He is on a statin.   He is not taking aspirin.  I suggested to him again that it would be a good idea to take aspirin 81 mg daily.  However, he does not want to take it since he is taking an arginine supplement which he says makes him bleed.  3. Hyperlipidemia: Excellent lipids in 1/15 on current statin.  4. HTN: Good control on current meds.  5. Atrial fibrillation: No documented recurrence for years.  Apparently he had 1 episode documented at the ER in [facility]  I do not have those records.  He had not been having palpitations until recently. Since 9/15, he has had 3 episodes of heart racing with rates up to 180s-200s.  It is possible that these episodes are atrial fibrillation, but given the high rate, SVT like AVNRT is a possibility.  - I think we need to document exactly what these episodes are.  Since they are very infrequent in general, I would favor  a LINQ monitor.  If he has documented atrial fibrillation, he is agreeable to anticoagulation.  Would likely use Eliquis.  - I will given him a prescription for metoprolol 25 mg to take prn fast HR.  6. CKD: Check BMET today.   [name] 04/19/2014

## 2014-06-06 ENCOUNTER — Encounter: Payer: Self-pay | Admitting: Internal Medicine

## 2014-06-06 ENCOUNTER — Ambulatory Visit (INDEPENDENT_AMBULATORY_CARE_PROVIDER_SITE_OTHER): Payer: Medicare Other | Admitting: Internal Medicine

## 2014-06-06 VITALS — BP 137/70 | HR 76 | Ht 71.0 in | Wt 218.0 lb

## 2014-06-06 DIAGNOSIS — R002 Palpitations: Secondary | ICD-10-CM | POA: Diagnosis not present

## 2014-06-06 DIAGNOSIS — I4891 Unspecified atrial fibrillation: Secondary | ICD-10-CM

## 2014-06-06 DIAGNOSIS — I10 Essential (primary) hypertension: Secondary | ICD-10-CM

## 2014-06-06 DIAGNOSIS — I2510 Atherosclerotic heart disease of native coronary artery without angina pectoris: Secondary | ICD-10-CM | POA: Diagnosis not present

## 2014-06-06 NOTE — Patient Instructions (Signed)
Samuel Willis will call in regards to setting up [facility]

## 2014-06-07 ENCOUNTER — Encounter: Payer: Self-pay | Admitting: Internal Medicine

## 2014-06-07 DIAGNOSIS — R002 Palpitations: Secondary | ICD-10-CM | POA: Insufficient documentation

## 2014-06-07 NOTE — Progress Notes (Signed)
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  Hereports that in 2010 he had atrial fibrillation.  This was aparently documented at [facility] and is not available to review.  He has not had documented atrial fibrillation since that and had only been having very rare palpitations.  However, in the last 2 months, he has had 3 episodes of tachy palpitations.  Each time it lasted about 3-4 hours.  HR would get up to 180-200 when he would check it on his BP cuff.   He reports associated chest pressure (mild).  He has stable exertional dyspnea.  He is short of breath walking up hills or heavy lifting.     He when pheasant hunting in [place] recently with no exertional problems.  He can walk for 30 minutes on the treadmill at the [facility] without problems.    He is reluctant to consider anticoagulation for atrial fibrillation.  His arrhythmia history is vague and Dr [name] would like EP assistance with further evaluation.  Today, he denies symptoms of exertional chest pain,  orthopnea, PND, lower extremity edema, dizziness, presyncope, syncope, or neurologic sequela. The patient is tolerating medications without difficulties and is otherwise without complaint today.   Past Medical History  Diagnosis Date  . GERD (gastroesophageal reflux disease)   . Hyperlipidemia   . Hypertension   . Nephrolithiasis   . Paroxysmal atrial fibrillation     documented briefly; no recurrence as of 2015; pt needs coumadin if it recurrs  . Heart valve problem     aortic stenosis, aortic regurgitation (bicuspid aortic valve)  . CAD (coronary artery disease)     LAD dz but no corresponding ischemia on myoview  . Aortic aneurysm, thoracic     bicuspid aortic valve. aortic stenosis,  . H/O hiatal hernia   . Wears glasses   . Fatty liver 04/2012; 06/2013    Noted  on noncontrast abd CT done during trauma w/u when tractor rolled onto his knee.  Also noted on noncontrast chest CT done to screen for asbestos lung damage.  . Asbestos exposure CT 06/2013    Noncalcified pleural plaques bilat; no signs of malignancy.  . Chronic renal insufficiency, stage III (moderate) 06/2013    CrCl about 50 ml/min   Past Surgical History  Procedure Laterality Date  . Esophagogastroduodenoscopy  02/23/2011    Barrett's esophagus, hiatal hernia - dilated for dysphagia  . Colonoscopy    . Tendon repair  11/06/2011    Procedure: TENDON REPAIR;  Surgeon: Cammie Sickle., MD;  Location: [place];  Service: Orthopedics;  Laterality: Left;  explore/repair tendons left hand   . Cardiac catheterization  06/24/10  . Circumcision  01/18/2012    Procedure: CIRCUMCISION ADULT;  Surgeon: [name], MD;  Location: [facility];  Service: Urology;  Laterality: N/A;  30 mins requested for this case   . Shoulder surgery      Arthroscopic 02/23/12    Current Outpatient Prescriptions  Medication Sig Dispense Refill  . Arginine 500 MG CAPS Take 1 capsule by mouth daily.    Marland Kitchen atorvastatin (LIPITOR) 40 MG tablet TAKE ONE TABLET BY MOUTH ONCE DAILY 90 tablet 1  . benazepril-hydrochlorthiazide (LOTENSIN HCT) 20-12.5 MG per tablet TAKE TWO TABLETS BY MOUTH ONCE DAILY 180 tablet 0  . metoprolol  tartrate (LOPRESSOR) 25 MG tablet 1 tablet as needed for palpitations 30 tablet 1  . omeprazole (PRILOSEC) 40 MG capsule Take 1 capsule (40 mg total) by mouth daily. 90 capsule 1   No current facility-administered medications for this visit.    No Known Allergies  History   Social History  . Marital Status: Married    Spouse Name: N/A    Number of Children: 2  . Years of Education: N/A   Occupational History  . Self employed    Social History Main Topics  . Smoking status: Former Smoker    Quit date: 11/03/1993  . Smokeless tobacco: Never Used  .  Alcohol Use: No  . Drug Use: No  . Sexual Activity: Not on file   Other Topics Concern  . Not on file   Social History Narrative   Married, 2 daughters.   Orig from [place].   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   No T/A/Ds.              Family History  Problem Relation Age of Onset  . Arthritis Mother   . Diabetes Mother   . Stroke Mother   . Heart attack Father   . Asthma Father   . Emphysema Father   . Heart disease Father   . Hyperlipidemia Neg Hx   . Hypertension Neg Hx     ROS- All systems are reviewed and negative except as per the HPI above  Physical Exam: Filed Vitals:   06/06/14 0856  BP: 137/70  Pulse: 76  Height: 5\' 11"  (1.803 m)  Weight: 218 lb (98.884 kg)    GEN- The patient is well appearing, alert and oriented x 3 today.   Head- normocephalic, atraumatic Eyes-  Sclera clear, conjunctiva pink Ears- hearing intact Oropharynx- clear Neck- supple  Lungs- Clear to ausculation bilaterally, normal work of breathing Heart- Regular rate and rhythm, no murmurs, rubs or gallops, PMI not laterally displaced GI- soft, NT, ND, + BS Extremities- no clubbing, cyanosis, or edema MS- no significant deformity or atrophy Skin- no rash or lesion Psych- euthymic mood, full affect Neuro- strength and sensation are intact  EKG today reveals sinus rhythm, LVH, inferior infaction Epic records including Dr Oleh Genin notes, MRI, CT, and echo are reviewed today  Assessment and Plan:  1. Atrial fibrillation The patient carries a diagnosis of atrial fibrillation though this is not well documented.  He has occasional tachypalpitations of unclear etiology.  His chads2vasc score is at least 3.  I agree with Dr [name] that we need additional documentation of his arrhythmia to better characterize his palpitations and to guide management.  The patient has previously worn event monitors and has not tolerated this well.   Today, we discussed options of EP study and also implantable loop recorder implantation.  Risks, benefits, and alternatives to each option were discussed at length.  He would prefer ILR implantation.  He is willing to consider our RIO II implantation trial. He understands that risks of implant including but are not limited to bleeding and infection and wishes to proceed.  We will therefore scheduled the procedure at the next available time.  In the interim, the patient does not wish to start anticoagulation. No changes are therefore made today.  2. HTN Stable No change required today  3. CAD No ischemic symptoms No changes today  He will follow-up with Dr [name] post device implant. I am happy  to see him in the future one his arrhythmias are better characterized.

## 2014-06-25 NOTE — Progress Notes (Signed)
RIO 2 Informed Consent  Subject Name: Samuel Willis  Subject met inclusion and exclusion criteria. The informed consent form, study requirements and expectations were reviewed with the subject and questions and concerns were addressed prior to the signing of the consent form. The subject verbalized understanding of the trail requirements. The subject agreed to participate in the RIO 2 trial and signed the informed consent. The informed consent was obtained prior to performance of any protocol-specific procedures for the subject. A copy of the signed informed consent was given to the subject and a copy was placed in the subject's medical record.   Raquel Sarna Shambria Camerer  06/22/2014

## 2014-06-29 ENCOUNTER — Encounter: Payer: Self-pay | Admitting: Internal Medicine

## 2014-06-29 ENCOUNTER — Ambulatory Visit (INDEPENDENT_AMBULATORY_CARE_PROVIDER_SITE_OTHER): Payer: Medicare Other | Admitting: Internal Medicine

## 2014-06-29 DIAGNOSIS — I480 Paroxysmal atrial fibrillation: Secondary | ICD-10-CM

## 2014-06-29 HISTORY — PX: OTHER SURGICAL HISTORY: SHX169

## 2014-06-29 NOTE — Progress Notes (Signed)
SURGEON:  [name], MD     PREPROCEDURE DIAGNOSIS:  Atrial fibrillation    POSTPROCEDURE DIAGNOSIS:  Atrial fibrillation     PROCEDURES:   1. Implantable loop recorder implantation    INTRODUCTION:  Samuel Willis is a 69 y.o. male with a history of prior afib who presents today for implantable loop implantation.  His afib burden has been difficult to capture.  He is reluctant to consider anticoagulation.  He now presents for ILR placement for afib management and for evaluation of palpitations.    DESCRIPTION OF PROCEDURE:  Informed written consent was obtained, and the patient was brought to the electrophysiology lab in a fasting state.  The patient required no sedation for the procedure today.  Mapping over the patient's chest was performed by the EP lab staff to identify the area where electrograms were most prominent for ILR recording.  This area was found to be the left parasternal region over the 3rd-4th intercostal space. The patients left chest was therefore prepped and draped in the usual sterile fashion by the EP lab staff. The skin overlying the left parasternal region was infiltrated with lidocaine for local analgesia.  A 0.5-cm incision was made over the left parasternal region over the 3rd intercostal space.  A subcutaneous ILR pocket was fashioned using a combination of sharp and blunt dissection.  A Medtronic Reveal [place] model [id number] implantable loop recorder was then placed into the pocket  R waves were very prominent and measured 0.7mV. EBL<1 ml.  Steri- Strips and a sterile dressing were then applied.  There were no early apparent complications.     CONCLUSIONS:   1. Successful implantation of a Medtronic Reveal LINQ implantable loop recorder for atrial fibrillation management and evaluation of palpitations  2. No early apparent complications.

## 2014-06-29 NOTE — Patient Instructions (Signed)
Your physician recommends that you schedule a follow-up appointment in: 7-10 days in device clinic for wound check

## 2014-07-18 ENCOUNTER — Ambulatory Visit (INDEPENDENT_AMBULATORY_CARE_PROVIDER_SITE_OTHER): Payer: Medicare Other | Admitting: *Deleted

## 2014-07-18 DIAGNOSIS — I480 Paroxysmal atrial fibrillation: Secondary | ICD-10-CM

## 2014-07-18 LAB — MDC_IDC_ENUM_SESS_TYPE_INCLINIC

## 2014-07-18 NOTE — Progress Notes (Signed)
Wound check ILR.  Steri strips removed.  Wound well healed.  2 pause episodes noted from implant date.  R-waves 0.10-0.16mV.  ROV in April with Dr. [name].

## 2014-07-25 ENCOUNTER — Telehealth: Payer: Self-pay | Admitting: Family Medicine

## 2014-07-25 DIAGNOSIS — I350 Nonrheumatic aortic (valve) stenosis: Secondary | ICD-10-CM

## 2014-07-25 DIAGNOSIS — I4891 Unspecified atrial fibrillation: Secondary | ICD-10-CM

## 2014-07-25 MED ORDER — METOPROLOL TARTRATE 25 MG PO TABS: ORAL_TABLET | ORAL | Status: DC

## 2014-07-25 MED ORDER — ATORVASTATIN CALCIUM 40 MG PO TABS: ORAL_TABLET | ORAL | Status: DC

## 2014-07-25 MED ORDER — OMEPRAZOLE 40 MG PO CPDR: 40.0000 mg | DELAYED_RELEASE_CAPSULE | Freq: Every day | ORAL | Status: DC

## 2014-07-25 NOTE — Telephone Encounter (Signed)
Please advise if you will send in 90 day supply to [place] pharmacy?

## 2014-07-25 NOTE — Telephone Encounter (Signed)
Samuel Willis spoke to patient and he is only in [place] for a business trip.  He will be there for one more week.  I sent in 30 day supply with no refills to his [facility] pharmacy per his request ( he said he'll get them transferred )  Then patient will need to f/u because he has not seen Dr. Anitra Lauth for chronic illnesses for over 1 year.  Patient aware.

## 2014-07-25 NOTE — Telephone Encounter (Signed)
Noted agree

## 2014-07-25 NOTE — Telephone Encounter (Signed)
Pt needs all 3 of his RX refilled. He would like 90 day supply. He is in [place] and once these are called into his [facility]. Here he will have them transferred to [place] to be filled.

## 2014-07-30 ENCOUNTER — Ambulatory Visit (INDEPENDENT_AMBULATORY_CARE_PROVIDER_SITE_OTHER): Payer: Medicare Other | Admitting: *Deleted

## 2014-07-30 DIAGNOSIS — I480 Paroxysmal atrial fibrillation: Secondary | ICD-10-CM

## 2014-08-01 NOTE — Progress Notes (Signed)
Loop recorder 

## 2014-08-03 ENCOUNTER — Encounter: Payer: Self-pay | Admitting: Internal Medicine

## 2014-08-06 ENCOUNTER — Encounter: Payer: Self-pay | Admitting: Family Medicine

## 2014-08-06 ENCOUNTER — Ambulatory Visit (INDEPENDENT_AMBULATORY_CARE_PROVIDER_SITE_OTHER): Payer: Medicare Other | Admitting: Family Medicine

## 2014-08-06 VITALS — BP 174/92 | HR 51 | Temp 98.2°F | Resp 18 | Ht 71.0 in | Wt 223.0 lb

## 2014-08-06 DIAGNOSIS — R7301 Impaired fasting glucose: Secondary | ICD-10-CM | POA: Diagnosis not present

## 2014-08-06 DIAGNOSIS — Z23 Encounter for immunization: Secondary | ICD-10-CM

## 2014-08-06 DIAGNOSIS — I350 Nonrheumatic aortic (valve) stenosis: Secondary | ICD-10-CM

## 2014-08-06 DIAGNOSIS — E785 Hyperlipidemia, unspecified: Secondary | ICD-10-CM | POA: Diagnosis not present

## 2014-08-06 DIAGNOSIS — Z125 Encounter for screening for malignant neoplasm of prostate: Secondary | ICD-10-CM | POA: Diagnosis not present

## 2014-08-06 DIAGNOSIS — R7303 Prediabetes: Secondary | ICD-10-CM

## 2014-08-06 DIAGNOSIS — I10 Essential (primary) hypertension: Secondary | ICD-10-CM

## 2014-08-06 DIAGNOSIS — R002 Palpitations: Secondary | ICD-10-CM | POA: Diagnosis not present

## 2014-08-06 DIAGNOSIS — R7309 Other abnormal glucose: Secondary | ICD-10-CM

## 2014-08-06 DIAGNOSIS — K760 Fatty (change of) liver, not elsewhere classified: Secondary | ICD-10-CM | POA: Diagnosis not present

## 2014-08-06 DIAGNOSIS — I4891 Unspecified atrial fibrillation: Secondary | ICD-10-CM

## 2014-08-06 DIAGNOSIS — E782 Mixed hyperlipidemia: Secondary | ICD-10-CM | POA: Diagnosis not present

## 2014-08-06 LAB — TSH: TSH: 1.9 u[IU]/mL (ref 0.35–4.50)

## 2014-08-06 LAB — HEMOGLOBIN A1C: Hgb A1c MFr Bld: 6.6 % — ABNORMAL HIGH (ref 4.6–6.5)

## 2014-08-06 MED ORDER — ATORVASTATIN CALCIUM 40 MG PO TABS: ORAL_TABLET | ORAL | Status: DC

## 2014-08-06 MED ORDER — METOPROLOL TARTRATE 25 MG PO TABS: ORAL_TABLET | ORAL | Status: DC

## 2014-08-06 MED ORDER — BENAZEPRIL-HYDROCHLOROTHIAZIDE 20-12.5 MG PO TABS: ORAL_TABLET | ORAL | Status: DC

## 2014-08-06 MED ORDER — OMEPRAZOLE 40 MG PO CPDR: 40.0000 mg | DELAYED_RELEASE_CAPSULE | Freq: Every day | ORAL | Status: DC

## 2014-08-06 NOTE — Progress Notes (Signed)
OFFICE VISIT  08/11/2014   CC:  Chief Complaint  Patient presents with  . Annual Exam   HPI:    Patient is a 69 y.o. Caucasian male who presents for 1 yr f/u HTN, hyperlipidemia, prediabetes.  Says everything is going ok except he has been out of his bp med b/c of a snafu with our recent attempt at refilling it: he has been taking metoprolol 25mg  bid instead (this is the med he is supposed to take prn palpitations).  No acute complaints.  Past Medical History  Diagnosis Date  . GERD (gastroesophageal reflux disease)   . Hyperlipidemia   . Hypertension   . Nephrolithiasis   . Paroxysmal atrial fibrillation     documented briefly; no recurrence as of 2015; pt needs coumadin if it recurrs  . Heart valve problem     aortic stenosis, aortic regurgitation (bicuspid aortic valve)  . CAD (coronary artery disease)     LAD dz but no corresponding ischemia on myoview  . Aortic aneurysm, thoracic     bicuspid aortic valve. aortic stenosis,  . H/O hiatal hernia   . Wears glasses   . Fatty liver 04/2012; 06/2013    Noted on noncontrast abd CT done during trauma w/u when tractor rolled onto his knee.  Also noted on noncontrast chest CT done to screen for asbestos lung damage.  . Asbestos exposure CT 06/2013    Noncalcified pleural plaques bilat; no signs of malignancy.  . Chronic renal insufficiency, stage III (moderate) 06/2013    CrCl about 50 ml/min  . Prediabetes 2015    A1c 6.4%    Past Surgical History  Procedure Laterality Date  . Esophagogastroduodenoscopy  02/23/2011    Barrett's esophagus, hiatal hernia - dilated for dysphagia  . Colonoscopy    . Tendon repair  11/06/2011    Procedure: TENDON REPAIR;  Surgeon: Cammie Sickle., MD;  Location: [place];  Service: Orthopedics;  Laterality: Left;  explore/repair tendons left hand   . Cardiac catheterization  06/24/10  . Circumcision  01/18/2012    Procedure: CIRCUMCISION ADULT;  Surgeon: [name], MD;   Location: [facility];  Service: Urology;  Laterality: N/A;  30 mins requested for this case   . Shoulder surgery      Arthroscopic 02/23/12  . Implantable loop recorder placement  06/29/14    MDT LINQ implanted by Dr [name] in the office as part of the RIO II protocol    Outpatient Prescriptions Prior to Visit  Medication Sig Dispense Refill  . Arginine 500 MG CAPS Take 1 capsule by mouth daily.    Marland Kitchen atorvastatin (LIPITOR) 40 MG tablet TAKE ONE TABLET BY MOUTH ONCE DAILY 30 tablet 0  . metoprolol tartrate (LOPRESSOR) 25 MG tablet 1 tablet as needed for palpitations 30 tablet 0  . omeprazole (PRILOSEC) 40 MG capsule Take 1 capsule (40 mg total) by mouth daily. 30 capsule 0  . benazepril-hydrochlorthiazide (LOTENSIN HCT) 20-12.5 MG per tablet TAKE TWO TABLETS BY MOUTH ONCE DAILY (Patient not taking: Reported on 08/06/2014) 180 tablet 0   No facility-administered medications prior to visit.    No Known Allergies  ROS As per HPI  PE: Blood pressure 174/92, pulse 51, temperature 98.2 F (36.8 C), temperature source Temporal, resp. rate 18, height 5\' 11"  (1.803 m), weight 223 lb (101.152 kg), SpO2 97 %. Gen: Alert, well appearing.  Patient is oriented to person, place, time, and situation. AFFECT: pleasant, lucid thought and  speech. ENT: Ears: EACs clear, normal epithelium.  TMs with good light reflex and landmarks bilaterally.  Eyes: no injection, icteris, swelling, or exudate.  EOMI, PERRLA. Nose: no drainage or turbinate edema/swelling.  No injection or focal lesion.  Mouth: lips without lesion/swelling.  Oral mucosa pink and moist.  Dentition intact and without obvious caries or gingival swelling.  Oropharynx without erythema, exudate, or swelling.  Neck: supple/nontender.  No LAD, mass, or TM.  Carotid pulses 2+ bilaterally, without bruits. CV: RRR, no m/r/g.   LUNGS: CTA bilat, nonlabored resps, good aeration in all lung fields. ABD: soft, NT, ND, BS normal.  No  hepatospenomegaly or mass.  No bruits. EXT: no clubbing, cyanosis, or edema.  Musculoskeletal: no joint swelling, erythema, warmth, or tenderness.  ROM of all joints intact. Skin - no sores or suspicious lesions or rashes or color changes Rectal exam: negative without mass, lesions or tenderness, PROSTATE EXAM: smooth and symmetric without nodules or tenderness.  LABS:    Chemistry      Component Value Date/Time   NA 141 08/06/2014 1413   K 4.5 08/06/2014 1413   CL 105 08/06/2014 1413   CO2 27 08/06/2014 1413   BUN 22 08/06/2014 1413   CREATININE 1.17 08/06/2014 1413   CREATININE 1.34 10/19/2011 0848      Component Value Date/Time   CALCIUM 9.5 08/06/2014 1413   ALKPHOS 46 08/06/2014 1413   AST 19 08/06/2014 1413   ALT 29 08/06/2014 1413   BILITOT 0.8 08/06/2014 1413       IMPRESSION AND PLAN:  1) HTN; well controlled except for inadvertent noncompliance with med recently. We'll get him back on his Lotensin HCT and he'll save his metoprolol use for prn tachypalpitations. Lytes/Cr today.  2) Hyperlipidemia; Tolerating statin. FLP today.  3) Dysrhythmia; currently wearing loop recorder.  Cardiology following. Will recheck TSH.  4) Prediabetes: recheck fasting gluc and Hba1c today. TLC emphasized.   5) Fatty liver: emphasized importance of wt loss, ongoing lipid control, transaminase monitoring. CMET today.  6) Prostate cancer screening: DRE normal today.  PSA obtained today.  7) Prev health care: prevnar 13 IM today.  An After Visit Summary was printed and given to the patient.  FOLLOW UP: Return in about 6 months (around 02/04/2015) for routine chronic illness f/u.

## 2014-08-06 NOTE — Progress Notes (Signed)
Pre visit review using our clinic review tool, if applicable. No additional management support is needed unless otherwise documented below in the visit note. 

## 2014-08-07 LAB — COMPREHENSIVE METABOLIC PANEL
ALT: 29 U/L (ref 0–53)
AST: 19 U/L (ref 0–37)
Albumin: 4.4 g/dL (ref 3.5–5.2)
Alkaline Phosphatase: 46 U/L (ref 39–117)
BUN: 22 mg/dL (ref 6–23)
CALCIUM: 9.5 mg/dL (ref 8.4–10.5)
CO2: 27 meq/L (ref 19–32)
Chloride: 105 mEq/L (ref 96–112)
Creatinine, Ser: 1.17 mg/dL (ref 0.40–1.50)
GFR: 65.89 mL/min (ref >60.00)
GLUCOSE: 92 mg/dL (ref 70–99)
Potassium: 4.5 mEq/L (ref 3.5–5.1)
SODIUM: 141 meq/L (ref 135–145)
TOTAL PROTEIN: 6.7 g/dL (ref 6.0–8.3)
Total Bilirubin: 0.8 mg/dL (ref 0.2–1.2)

## 2014-08-07 LAB — LIPID PANEL
CHOLESTEROL: 137 mg/dL (ref 0–200)
HDL: 41.9 mg/dL (ref >39.00)
LDL Cholesterol: 83 mg/dL (ref 0–99)
NONHDL: 95.1
Total CHOL/HDL Ratio: 3
Triglycerides: 63 mg/dL (ref 0.0–149.0)
VLDL: 12.6 mg/dL (ref 0.0–40.0)

## 2014-08-08 LAB — MDC_IDC_ENUM_SESS_TYPE_REMOTE
Date Time Interrogation Session: 20160215015557
MDC IDC SET ZONE DETECTION INTERVAL: 2000 ms
Zone Setting Detection Interval: 3000 ms
Zone Setting Detection Interval: 370 ms

## 2014-08-17 ENCOUNTER — Encounter: Payer: Self-pay | Admitting: Internal Medicine

## 2014-08-28 ENCOUNTER — Ambulatory Visit (INDEPENDENT_AMBULATORY_CARE_PROVIDER_SITE_OTHER): Payer: Medicare Other | Admitting: *Deleted

## 2014-08-28 DIAGNOSIS — I480 Paroxysmal atrial fibrillation: Secondary | ICD-10-CM | POA: Diagnosis not present

## 2014-08-30 NOTE — Progress Notes (Signed)
Loop recorder 

## 2014-09-13 LAB — MDC_IDC_ENUM_SESS_TYPE_REMOTE

## 2014-09-21 ENCOUNTER — Encounter: Payer: Self-pay | Admitting: Internal Medicine

## 2014-09-27 ENCOUNTER — Ambulatory Visit (INDEPENDENT_AMBULATORY_CARE_PROVIDER_SITE_OTHER): Payer: Medicare Other | Admitting: *Deleted

## 2014-09-27 DIAGNOSIS — D225 Melanocytic nevi of trunk: Secondary | ICD-10-CM | POA: Diagnosis not present

## 2014-09-27 DIAGNOSIS — I480 Paroxysmal atrial fibrillation: Secondary | ICD-10-CM

## 2014-09-27 DIAGNOSIS — D229 Melanocytic nevi, unspecified: Secondary | ICD-10-CM | POA: Diagnosis not present

## 2014-09-27 DIAGNOSIS — L821 Other seborrheic keratosis: Secondary | ICD-10-CM | POA: Diagnosis not present

## 2014-09-27 DIAGNOSIS — L812 Freckles: Secondary | ICD-10-CM | POA: Diagnosis not present

## 2014-09-28 NOTE — Progress Notes (Signed)
Loop recorder 

## 2014-10-02 ENCOUNTER — Encounter: Payer: Self-pay | Admitting: Internal Medicine

## 2014-10-03 ENCOUNTER — Telehealth: Payer: Self-pay | Admitting: Cardiology

## 2014-10-03 ENCOUNTER — Encounter: Payer: Self-pay | Admitting: Internal Medicine

## 2014-10-03 ENCOUNTER — Ambulatory Visit (INDEPENDENT_AMBULATORY_CARE_PROVIDER_SITE_OTHER): Payer: Medicare Other | Admitting: Internal Medicine

## 2014-10-03 VITALS — BP 96/50 | HR 86 | Ht 71.5 in | Wt 210.0 lb

## 2014-10-03 DIAGNOSIS — Q231 Congenital insufficiency of aortic valve: Secondary | ICD-10-CM

## 2014-10-03 DIAGNOSIS — I480 Paroxysmal atrial fibrillation: Secondary | ICD-10-CM

## 2014-10-03 DIAGNOSIS — I359 Nonrheumatic aortic valve disorder, unspecified: Secondary | ICD-10-CM

## 2014-10-03 LAB — MDC_IDC_ENUM_SESS_TYPE_INCLINIC

## 2014-10-03 NOTE — Patient Instructions (Signed)
Medication Instructions:  Your physician recommends that you continue on your current medications as directed. Please refer to the Current Medication list given to you today.   Labwork: None ordered  Testing/Procedures: Your physician has requested that you have an echocardiogram. Echocardiography is a painless test that uses sound waves to create images of your heart. It provides your doctor with information about the size and shape of your heart and how well your heart's chambers and valves are working. This procedure takes approximately one hour. There are no restrictions for this procedure.    Follow-Up: Your physician recommends that you schedule a follow-up appointment as needed with Dr [name]   Any Other Special Instructions Will Be Listed Below (If Applicable).

## 2014-10-03 NOTE — Telephone Encounter (Signed)
New Message  Pt recently had a visit with Dr. [name], whom ordered an ECHO so that Dr. [name] could review the results in May. Pt states that he was referred to Dr. [name] by Dr. [name], Dr. [name] has to resch the appt in may per [place] list. Pt req a call back to determine if he will need to push the ECHO out and/ or have Dr. [name] refer him to another cardiologist within the practice as he is not happy with being [place]. Please assist.

## 2014-10-03 NOTE — Telephone Encounter (Signed)
Fit him in one day in early May when I'm in the office.  I can see him added on as first or last patient of the day. Fine to overbook on a day that works for him.

## 2014-10-03 NOTE — Telephone Encounter (Signed)
Patient wants a May appointment with Dr. [name]. He was referred by Dr. [name]. He has his ECHO scheduled for May 25th. He would like an appointment close to that time in order to get the results explained from him by Dr. [name]. Routed to Dr. [name] and Desiree Lucy, RN.

## 2014-10-03 NOTE — Progress Notes (Signed)
Electrophysiology Office Note   Date:  10/03/2014   ID:  Samuel Willis, DOB Jun 16, 1945, MRN [id number]  PCP:  Tammi Sou, MD  Cardiologist:  DR [name] Primary Electrophysiologist: [name], MD    Chief Complaint  Patient presents with  . Atrial Fibrillation    Paroxysmal     History of Present Illness: Samuel Willis is a 69 y.o. male who presents today for electrophysiology evaluation.   He has done well since I saw him last.  No complications post ILR implant.  He has been found to have afib though his AF burden is low.  Continues to decline anticoagulation.  He stable SOB.  Rare palpitations.  Today, he denies symptoms of chest pain, orthopnea, PND, lower extremity edema, claudication, dizziness, presyncope, syncope, bleeding, or neurologic sequela. The patient is tolerating medications without difficulties and is otherwise without complaint today.    Past Medical History  Diagnosis Date  . GERD (gastroesophageal reflux disease)   . Hyperlipidemia   . Hypertension   . Nephrolithiasis   . Paroxysmal atrial fibrillation     declines anticoagulation, chads2vasc score is at least 3  . Heart valve problem     aortic stenosis, aortic regurgitation (bicuspid aortic valve)  . CAD (coronary artery disease)     LAD dz but no corresponding ischemia on myoview  . Aortic aneurysm, thoracic     bicuspid aortic valve. aortic stenosis,  . H/O hiatal hernia   . Wears glasses   . Fatty liver 04/2012; 06/2013    Noted on noncontrast abd CT done during trauma w/u when tractor rolled onto his knee.  Also noted on noncontrast chest CT done to screen for asbestos lung damage.  . Asbestos exposure CT 06/2013    Noncalcified pleural plaques bilat; no signs of malignancy.  . Chronic renal insufficiency, stage III (moderate) 06/2013    CrCl about 50 ml/min  . Prediabetes 2015    A1c 6.4%   Past Surgical History  Procedure Laterality Date  . Esophagogastroduodenoscopy  02/23/2011   Barrett's esophagus, hiatal hernia - dilated for dysphagia  . Colonoscopy    . Tendon repair  11/06/2011    Procedure: TENDON REPAIR;  Surgeon: Cammie Sickle., MD;  Location: [facility];  Service: Orthopedics;  Laterality: Left;  explore/repair tendons left hand   . Cardiac catheterization  06/24/10  . Circumcision  01/18/2012    Procedure: CIRCUMCISION ADULT;  Surgeon: [name], MD;  Location: [facility];  Service: Urology;  Laterality: N/A;  30 mins requested for this case   . Shoulder surgery      Arthroscopic 02/23/12  . Implantable loop recorder placement  06/29/14    MDT LINQ implanted by Dr [name] in the office as part of the RIO II protocol     Current Outpatient Prescriptions  Medication Sig Dispense Refill  . Arginine 500 MG CAPS Take 1 capsule by mouth daily.    Marland Kitchen atorvastatin (LIPITOR) 40 MG tablet TAKE ONE TABLET BY MOUTH ONCE DAILY 90 tablet 3  . benazepril-hydrochlorthiazide (LOTENSIN HCT) 20-12.5 MG per tablet TAKE TWO TABLETS BY MOUTH ONCE DAILY 180 tablet 3  . metoprolol tartrate (LOPRESSOR) 25 MG tablet Take 25 mg by mouth daily as needed (PALPITATIONS).    Marland Kitchen omeprazole (PRILOSEC) 40 MG capsule Take 1 capsule (40 mg total) by mouth daily. 90 capsule 3   No current facility-administered medications for this visit.    Allergies:   Review of patient's allergies  indicates no known allergies.   Social History:  The patient  reports that he quit smoking about 20 years ago. He has never used smokeless tobacco. He reports that he does not drink alcohol or use illicit drugs.   Family History:  The patient's family history includes Arthritis in his mother; Asthma in his father; Diabetes in his mother; Emphysema in his father; Heart attack (age of onset: 58) in his father; Heart disease in his father; Stroke in his mother. There is no history of Hyperlipidemia or Hypertension.    ROS:  Please see the history of present illness.   All other  systems are reviewed and negative.    PHYSICAL EXAM: VS:  BP 96/50 mmHg  Pulse 86  Ht 5' 11.5" (1.816 m)  Wt 210 lb (95.255 kg)  BMI 28.88 kg/m2 , BMI Body mass index is 28.88 kg/(m^2). GEN: Well nourished, well developed, in no acute distress HEENT: normal Neck: no JVD, carotid bruits, or masses Cardiac: RRR; + 2/6 diastolic murmur LUSB, 2/6 SEM LUSB, no rubs, or gallops,no edema  Respiratory:  clear to auscultation bilaterally, normal work of breathing GI: soft, nontender, nondistended, + BS MS: no deformity or atrophy Skin: warm and dry, ILR site is well healed Neuro:  Strength and sensation are intact Psych: euthymic mood, full affect  Device interrogation is reviewed today in detail.  See PaceArt for details.   Recent Labs: 04/18/2014: Hemoglobin 13.8; Platelets 191.0 08/06/2014: ALT 29; BUN 22; Creatinine 1.17; Potassium 4.5; Sodium 141; TSH 1.90    Lipid Panel     Component Value Date/Time   CHOL 137 08/06/2014 1413   TRIG 63.0 08/06/2014 1413   HDL 41.90 08/06/2014 1413   CHOLHDL 3 08/06/2014 1413   VLDL 12.6 08/06/2014 1413   LDLCALC 83 08/06/2014 1413     Wt Readings from Last 3 Encounters:  10/03/14 210 lb (95.255 kg)  08/06/14 223 lb (101.152 kg)  06/06/14 218 lb (98.884 kg)     ASSESSMENT AND PLAN:  1.  Atrial fibrillation Confirmed with ILR implant.  Burden is 0.3%, longest episode is 3 hours. This patients CHA2DS2-VASc Score and unadjusted Ischemic Stroke Rate (% per year) is equal to 3.2 % stroke rate/year from a score of 3  Unfortunately, he is clear in his decision to decline anticoagulatio at this time.  He is willing to discuss further with Dr [name].  2. Aortic valve disease Repeat echo Follow up with Dr [name] as scheduled  3. Hypotension Asymptomatic today He reports that this is stable  I will follow remotely with carelink and see pt as needed going forward Further care per Dr [name]  Above score calculated as 1 point each if  present [CHF, HTN, DM, Vascular=MI/PAD/Aortic Plaque, Age if 65-74, or Male] Above score calculated as 2 points each if present [Age > 75, or Stroke/TIA/TE]  Current medicines are reviewed at length with the patient today.   The patient does not have concerns regarding his medicines.  The following changes were made today:  none  Labs/ tests ordered today include:  Orders Placed This Encounter  Procedures  . Implantable device check  . 2D Echocardiogram without contrast    Signed, [name], MD  10/03/2014 1:51 PM     [address] [place] [address] [phone] (office) [phone] (fax)

## 2014-10-04 NOTE — Telephone Encounter (Signed)
[facility] 4/21

## 2014-10-04 NOTE — Telephone Encounter (Signed)
Called and confirmed appt with wife for 05/09 at 12:45 with Dr. [name] per nurse [email or web address]

## 2014-10-05 ENCOUNTER — Encounter: Payer: Self-pay | Admitting: Internal Medicine

## 2014-10-08 ENCOUNTER — Ambulatory Visit (HOSPITAL_COMMUNITY): Payer: Medicare Other | Attending: Cardiology | Admitting: Radiology

## 2014-10-08 DIAGNOSIS — I359 Nonrheumatic aortic valve disorder, unspecified: Secondary | ICD-10-CM

## 2014-10-08 DIAGNOSIS — I480 Paroxysmal atrial fibrillation: Secondary | ICD-10-CM | POA: Diagnosis not present

## 2014-10-08 DIAGNOSIS — I4891 Unspecified atrial fibrillation: Secondary | ICD-10-CM

## 2014-10-08 NOTE — Progress Notes (Signed)
Echocardiogram performed.  

## 2014-10-19 ENCOUNTER — Encounter: Payer: Self-pay | Admitting: Internal Medicine

## 2014-10-19 LAB — CUP PACEART REMOTE DEVICE CHECK
Date Time Interrogation Session: 20160412111424
MDC IDC SET ZONE DETECTION INTERVAL: 3000 ms
Zone Setting Detection Interval: 2000 ms
Zone Setting Detection Interval: 370 ms

## 2014-10-22 ENCOUNTER — Encounter: Payer: Self-pay | Admitting: Cardiology

## 2014-10-22 ENCOUNTER — Ambulatory Visit (INDEPENDENT_AMBULATORY_CARE_PROVIDER_SITE_OTHER): Payer: Medicare Other | Admitting: Cardiology

## 2014-10-22 VITALS — BP 142/82 | HR 57 | Ht 71.5 in | Wt 210.0 lb

## 2014-10-22 DIAGNOSIS — Q231 Congenital insufficiency of aortic valve: Secondary | ICD-10-CM

## 2014-10-22 DIAGNOSIS — I2510 Atherosclerotic heart disease of native coronary artery without angina pectoris: Secondary | ICD-10-CM

## 2014-10-22 DIAGNOSIS — E785 Hyperlipidemia, unspecified: Secondary | ICD-10-CM

## 2014-10-22 DIAGNOSIS — I480 Paroxysmal atrial fibrillation: Secondary | ICD-10-CM

## 2014-10-22 MED ORDER — APIXABAN 5 MG PO TABS: 5.0000 mg | ORAL_TABLET | Freq: Two times a day (BID) | ORAL | Status: DC

## 2014-10-22 NOTE — Patient Instructions (Addendum)
Medication Instructions:  Your physician has recommended you make the following change in your medication:  1. STOP Arginine when you start ELIQUIS 2. START Eliquis 5mg  take one by mouth twice a day  Labwork: No new orders.   Testing/Procedures: No new orders.  Follow-Up: Your physician wants you to follow-up in: 6 MONTHS with Dr [name].  You will receive a reminder letter in the mail two months in advance. If you don't receive a letter, please call our office to schedule the follow-up appointment.  Any Other Special Instructions Will Be Listed Below (If Applicable).

## 2014-10-23 NOTE — Progress Notes (Signed)
Patient ID: Samuel Willis, male   DOB: January 01, 1946, 69 y.o.   MRN: [id number] PCP: Dr. Anitra Lauth  69 yo with history of bicuspid aortic valve and mild AS/mild-moderate AI as well as ?paroxysmal atrial fibrillation presents for followup. Last echo in 2/15 showed EF 60-65%, bicuspid aortic valve with mild AS and mild-moderate AI.  MRA aorta in 2015 showed aortic root 4.1 cm and ascending aorta 4.1 cm. Left heart cath in 2012 showed a 60-70% mid LAD stenosis.  ETT-myoview done after this showed good exercise tolerance and no evidence of ischemia or infarction.     Samuel Willis has a history of occasional palpitations.  He had a LINQ monitor placed, and this has documented paroxysmal atrial fibrillation.  He will feel palpitations lasting a few hours every few months.  When he has these atrial fibrillation episodes, he will be very fatigued.  He says that he does not have daytime sleepiness and does not snore much.  I think OSA is probably unlikely.   He has stable exertional dyspnea.  He is short of breath walking up hills or heavy lifting.  No dyspnea with walking on flat ground.  He can walk for 30 minutes on the treadmill at the [facility] without problems.  No exertional chest pain.  He has not been taking aspirin because of his arginine supplement.  BP is controlled for the most part, sometimes gets up to the 140s when he checks at home but mostly in the 035D-974B systolic.   ECG: NSR, nonspecific inferior T wave flattening  Labs (12/10): creatinine 1.4  Labs (6/12): K 4.5, creatinine 1.2, LDL 66, HDL 48 Labs (7/14): K 4.5, creatinine 1.3, LDL 71, HDL 49 Labs (1/15): K 4.8, creatinine 1.5, LDL 58, HDL 36 Lbas (2/16): K 4.5, creatinine 1.17, LDL 83, HDL 42, TSH normal  Allergies (verified):  No Known Drug Allergies   Past Medical History:  1. Paroxysmal atrial fibrillation. Patient has a brief episode of irregular heart beating every 1-2 years. One was apparently documented as atrial fibrillation years ago  in [employer]. His CHADSVASC score is 3 now that CAD has been found. LINQ monitor was placed and has documented paroxysmal atrial fibrillation. 2. Hypertension.  3. Hyperlipidemia.  4. CAD: Exercise treadmill Myoview done in January 2010. The patient exercised for 10 minutes and 46 seconds. He stopped due to fatigue. EF was 56%. There was normal perfusion with no evidence for ischemia or infarction. LHC (1/12): 60-70% mLAD stenosis.  ETT-myoview was then done to assess for ischemia in the LAD territory. Patient exercised 10', EF was 69% with no evidence for ischemia or infarction.   5. Aortic stenosis and aortic regurgitation (bicuspid aortic valve). The patient did have an echocardiogram done on June 26, 2008. EF was 65%. There were no regional wall motion abnormalities. There was moderate aortic regurgitation. There was mild aortic stenosis by mean gradient which was 12 mmHg. There was mild ascending aorta and aortic root dilation. There was mild mitral regurgitation, mild left atrial enlargement, and right atrial enlargement. TEE was done, confirming a functionally bicuspid aortic valve with fusion of the right and noncoronary cusps. There was mild aortic stenosis and moderate aortic insufficiency. The ascending aorta was mildly dilated at 3.9 cm. TTE (1/10) showed EF 60% with mild LVH, mild AS (mean gradient 12), mild to moderate AR, normal LV size. TTE (1/12) with EF 60%, mild LVH, moderate diastolic dysfunction, mild AS (mean gradient 11 mmHg), mild AI, normal RV. MRA chest (1/12) with  3.7 cm aortic root and ascending aorta. TTE (2/13): EF 60-65%, mild LVH, mild AS, mild AI, mild Samuel.  Echo (2/15) with EF 60-65%, bicuspid aortic valve with mild aortic stenosis and mild to moderate AI, mild Samuel.  MRA chest (2/15) with 4.1 cm aortic root and 4.1 cm ascending aorta.  Echo (4/16) with EF 60-65%, mild AS mean gradient 16 mmHg.  6. Biceps tendon rupture.  7. CKD  Family History:  The patient's mother has a  history of stroke, also has questionable history of atrial fibrillation. The patient's father had an MI in his 79s.   Social History:  The patient is married, lives in [place], is a nonsmoker. He owns several businesses. He is not drinking alcohol. He works out with a Clinical research associate a couple of days a week and gets a significant amount of cardiovascular exercise.   Review of Systems  All systems reviewed and negative except as per HPI.   Current Outpatient Prescriptions  Medication Sig Dispense Refill  . apixaban (ELIQUIS) 5 MG TABS tablet Take 1 tablet (5 mg total) by mouth 2 (two) times daily. 60 tablet 2  . atorvastatin (LIPITOR) 40 MG tablet TAKE ONE TABLET BY MOUTH ONCE DAILY 90 tablet 3  . benazepril-hydrochlorthiazide (LOTENSIN HCT) 20-12.5 MG per tablet TAKE TWO TABLETS BY MOUTH ONCE DAILY 180 tablet 3  . metoprolol tartrate (LOPRESSOR) 25 MG tablet Take 25 mg by mouth daily as needed (PALPITATIONS).    Marland Kitchen omeprazole (PRILOSEC) 40 MG capsule Take 1 capsule (40 mg total) by mouth daily. 90 capsule 3   No current facility-administered medications for this visit.    BP 142/82 mmHg  Pulse 57  Ht 5' 11.5" (1.816 m)  Wt 210 lb (95.255 kg)  BMI 28.88 kg/m2 General: NAD Neck: No JVD, no thyromegaly or thyroid nodule.  Lungs: Clear to auscultation bilaterally with normal respiratory effort. CV: Nondisplaced PMI.  Heart regular S1/S2, no S3/S4, 3/6 SEM RUSB (S2 heard clearly).  No peripheral edema.  No carotid bruit.  2+ left PT pulses, weak right PT pulse.   Abdomen: Soft, nontender, no hepatosplenomegaly, no distention.   Neurologic: Alert and oriented x 3.  Psych: Normal affect. Extremities: No clubbing or cyanosis.   Assessment/Plan: 1. Bicuspid aortic valve disorder: Mild AS on last echo.  Mild dilation of ascending aorta on MRA (4.1 cm).  If no new symptoms develop, repeat echo in 2 years.   He will have MRA chest to assess ascending aorta in 2/17 (2 years after the previous study).    2. CAD: Moderate nonobstructive disease on prior cath.  He is on a statin.  I am going to start him on Eliquis so will not use ASA.   3. Hyperlipidemia: Good lipids 2/16, continue current statin.  4. HTN: Reasonable control on current meds.   5. Atrial fibrillation: Paroxysmal atrial fibrillation seen by [facility] monitor.  He is symptomatic when in atrial fibrillation, but episodes only last a few hours and occur only every few months.   - I will have him stop arginine and start Eliquis 5 mg bid.   - If atrial fibrillation episodes become more frequent, he would be a reasonable dronedarone candidate.  Would avoid Ic agent with moderate CAD.    [name] 10/23/2014

## 2014-10-26 ENCOUNTER — Ambulatory Visit (INDEPENDENT_AMBULATORY_CARE_PROVIDER_SITE_OTHER): Payer: Medicare Other | Admitting: *Deleted

## 2014-10-26 ENCOUNTER — Encounter: Payer: Self-pay | Admitting: Internal Medicine

## 2014-10-26 DIAGNOSIS — I480 Paroxysmal atrial fibrillation: Secondary | ICD-10-CM | POA: Diagnosis not present

## 2014-10-31 ENCOUNTER — Ambulatory Visit: Payer: BLUE CROSS/BLUE SHIELD | Admitting: Cardiology

## 2014-11-02 NOTE — Progress Notes (Signed)
Loop recorder 

## 2014-11-14 LAB — CUP PACEART REMOTE DEVICE CHECK: MDC IDC SESS DTM: 20160601190014

## 2014-11-26 ENCOUNTER — Encounter: Payer: Self-pay | Admitting: Internal Medicine

## 2014-11-26 ENCOUNTER — Ambulatory Visit (INDEPENDENT_AMBULATORY_CARE_PROVIDER_SITE_OTHER): Payer: Medicare Other | Admitting: *Deleted

## 2014-11-26 DIAGNOSIS — I480 Paroxysmal atrial fibrillation: Secondary | ICD-10-CM

## 2014-11-27 NOTE — Progress Notes (Signed)
Loop recorder 

## 2014-12-06 LAB — CUP PACEART REMOTE DEVICE CHECK
Date Time Interrogation Session: 20160620010335
MDC IDC SET ZONE DETECTION INTERVAL: 2000 ms
MDC IDC SET ZONE DETECTION INTERVAL: 3000 ms
Zone Setting Detection Interval: 370 ms

## 2014-12-19 ENCOUNTER — Encounter: Payer: Self-pay | Admitting: Internal Medicine

## 2014-12-20 ENCOUNTER — Encounter: Payer: Self-pay | Admitting: Cardiology

## 2014-12-26 ENCOUNTER — Ambulatory Visit (INDEPENDENT_AMBULATORY_CARE_PROVIDER_SITE_OTHER): Payer: Medicare Other | Admitting: *Deleted

## 2014-12-26 ENCOUNTER — Encounter: Payer: Self-pay | Admitting: Cardiology

## 2014-12-26 DIAGNOSIS — I480 Paroxysmal atrial fibrillation: Secondary | ICD-10-CM | POA: Diagnosis not present

## 2014-12-27 NOTE — Progress Notes (Signed)
Loop recorder 

## 2015-01-04 LAB — CUP PACEART REMOTE DEVICE CHECK
Date Time Interrogation Session: 20160714000002
MDC IDC SET ZONE DETECTION INTERVAL: 2000 ms
Zone Setting Detection Interval: 3000 ms
Zone Setting Detection Interval: 370 ms

## 2015-01-24 ENCOUNTER — Encounter: Payer: Self-pay | Admitting: Internal Medicine

## 2015-01-25 ENCOUNTER — Ambulatory Visit (INDEPENDENT_AMBULATORY_CARE_PROVIDER_SITE_OTHER): Payer: Medicare Other

## 2015-01-25 DIAGNOSIS — I480 Paroxysmal atrial fibrillation: Secondary | ICD-10-CM | POA: Diagnosis not present

## 2015-01-25 LAB — CUP PACEART REMOTE DEVICE CHECK: Date Time Interrogation Session: 20160829105610

## 2015-01-29 NOTE — Progress Notes (Signed)
Loop recorder 

## 2015-01-30 ENCOUNTER — Encounter: Payer: Self-pay | Admitting: Internal Medicine

## 2015-02-19 ENCOUNTER — Encounter: Payer: Self-pay | Admitting: Internal Medicine

## 2015-02-22 ENCOUNTER — Encounter: Payer: Self-pay | Admitting: Cardiology

## 2015-02-25 ENCOUNTER — Ambulatory Visit (INDEPENDENT_AMBULATORY_CARE_PROVIDER_SITE_OTHER): Payer: Medicare Other | Admitting: *Deleted

## 2015-02-25 DIAGNOSIS — I480 Paroxysmal atrial fibrillation: Secondary | ICD-10-CM

## 2015-02-26 NOTE — Progress Notes (Signed)
Loop recorder 

## 2015-03-06 LAB — CUP PACEART REMOTE DEVICE CHECK: Date Time Interrogation Session: 20160911200729

## 2015-03-06 NOTE — Progress Notes (Signed)
Carelink summary report received. Battery status OK. Normal device function. No new symptom episodes, tachy episodes, brady, or pause episodes. No new AF episodes. Monthly summary reports and ROV with JA PRN. 

## 2015-03-21 ENCOUNTER — Encounter: Payer: Self-pay | Admitting: Internal Medicine

## 2015-03-26 ENCOUNTER — Ambulatory Visit (INDEPENDENT_AMBULATORY_CARE_PROVIDER_SITE_OTHER): Payer: Medicare Other | Admitting: *Deleted

## 2015-03-26 DIAGNOSIS — I4891 Unspecified atrial fibrillation: Secondary | ICD-10-CM | POA: Diagnosis not present

## 2015-04-03 NOTE — Progress Notes (Signed)
LOOP RECORDER  

## 2015-04-05 LAB — CUP PACEART REMOTE DEVICE CHECK: MDC IDC SESS DTM: 20161011200908

## 2015-04-05 NOTE — Progress Notes (Signed)
Carelink summary report received. Battery status OK. Normal device function. No new symptom episodes, tachy episodes, brady, or pause episodes. No new AF episodes, +Eliquis. Monthly summary reports and ROV with JA PRN. 

## 2015-04-24 DIAGNOSIS — L821 Other seborrheic keratosis: Secondary | ICD-10-CM | POA: Diagnosis not present

## 2015-04-24 DIAGNOSIS — L570 Actinic keratosis: Secondary | ICD-10-CM | POA: Diagnosis not present

## 2015-04-25 ENCOUNTER — Ambulatory Visit (INDEPENDENT_AMBULATORY_CARE_PROVIDER_SITE_OTHER): Payer: Medicare Other | Admitting: *Deleted

## 2015-04-25 DIAGNOSIS — I480 Paroxysmal atrial fibrillation: Secondary | ICD-10-CM

## 2015-04-26 NOTE — Progress Notes (Signed)
Carelink Summary Report / Loop recorder 

## 2015-05-11 ENCOUNTER — Other Ambulatory Visit: Payer: Self-pay | Admitting: Family Medicine

## 2015-05-13 NOTE — Telephone Encounter (Signed)
Pt advised and voiced understanding.  Apt made for 08/19/15 at 10:30am.

## 2015-05-13 NOTE — Telephone Encounter (Signed)
RF request for benazep/hctz LOV: 08/06/14 - over due for f/u Next ov: None Last written: 08/06/14 #180 w/ 3RF  Will sent one refill. Pt needs f/u ov for more refills.

## 2015-05-24 LAB — CUP PACEART REMOTE DEVICE CHECK: Date Time Interrogation Session: 20161110203831

## 2015-05-24 NOTE — Progress Notes (Signed)
Carelink summary report received. Battery status OK. Normal device function. No new symptom episodes, tachy episodes, brady, or pause episodes. 2 new AF episodes (burden 0.4%), +Eliquis and metoprolol, avg V rate poorly controlled. Monthly summary reports and ROV with JA PRN.

## 2015-05-27 ENCOUNTER — Ambulatory Visit (INDEPENDENT_AMBULATORY_CARE_PROVIDER_SITE_OTHER): Payer: Medicare Other | Admitting: *Deleted

## 2015-05-27 DIAGNOSIS — I480 Paroxysmal atrial fibrillation: Secondary | ICD-10-CM | POA: Diagnosis not present

## 2015-05-27 NOTE — Progress Notes (Signed)
Carelink Summary Report / Loop Recorder 

## 2015-06-12 ENCOUNTER — Encounter: Payer: Self-pay | Admitting: *Deleted

## 2015-06-24 ENCOUNTER — Ambulatory Visit (INDEPENDENT_AMBULATORY_CARE_PROVIDER_SITE_OTHER): Payer: Medicare Other | Admitting: *Deleted

## 2015-06-24 DIAGNOSIS — I480 Paroxysmal atrial fibrillation: Secondary | ICD-10-CM

## 2015-06-24 DIAGNOSIS — R002 Palpitations: Secondary | ICD-10-CM

## 2015-06-24 NOTE — Progress Notes (Signed)
Patient seen in device clinic as walk-in for [facility] questions.  Discussed patient's AF burden with him and importance of taking Eliquis as prescribed.  Patient reports he has not been taking his Eliquis because his blood "is naturally thin".  Reiterated importance of taking Eliquis and patient is agreeable to trying samples for one month and following-up with Dr. [name] on 07/22/15 at 11:15am to further discuss stroke risk and atrial fibrillation.  Patient is aware that future monthly appointments that appear in [place] are remote summary reports.

## 2015-06-25 NOTE — Progress Notes (Signed)
Carelink Summary Report / Loop Recorder 

## 2015-07-15 ENCOUNTER — Encounter: Payer: Self-pay | Admitting: Family Medicine

## 2015-07-17 LAB — CUP PACEART REMOTE DEVICE CHECK: Date Time Interrogation Session: 20161216060749

## 2015-07-22 ENCOUNTER — Encounter: Payer: Self-pay | Admitting: Internal Medicine

## 2015-07-22 ENCOUNTER — Ambulatory Visit (INDEPENDENT_AMBULATORY_CARE_PROVIDER_SITE_OTHER): Payer: Medicare Other | Admitting: Internal Medicine

## 2015-07-22 ENCOUNTER — Other Ambulatory Visit: Payer: Self-pay | Admitting: Nurse Practitioner

## 2015-07-22 VITALS — BP 108/72 | HR 70 | Ht 71.0 in | Wt 212.6 lb

## 2015-07-22 DIAGNOSIS — I10 Essential (primary) hypertension: Secondary | ICD-10-CM | POA: Diagnosis not present

## 2015-07-22 DIAGNOSIS — I480 Paroxysmal atrial fibrillation: Secondary | ICD-10-CM | POA: Diagnosis not present

## 2015-07-22 DIAGNOSIS — I359 Nonrheumatic aortic valve disorder, unspecified: Secondary | ICD-10-CM

## 2015-07-22 LAB — CUP PACEART INCLINIC DEVICE CHECK: Date Time Interrogation Session: 20170206115957

## 2015-07-22 NOTE — Progress Notes (Signed)
Electrophysiology Office Note   Date:  07/22/2015   ID:  [name], DOB [birth year], MRN [id number]  PCP:  Tammi Sou, MD  Cardiologist:  DR [name] Primary Electrophysiologist: [name], MD    Chief Complaint  Patient presents with  . Atrial Fibrillation     History of Present Illness: Samuel Willis is a 70 y.o. male who presents today for electrophysiology evaluation.   He has done well since I saw him last.  Remains active.  He stable SOB.  Rare palpitations.  Today, he denies symptoms of chest pain, orthopnea, PND, lower extremity edema, claudication, dizziness, presyncope, syncope, bleeding, or neurologic sequela. The patient is tolerating medications without difficulties and is otherwise without complaint today.    Past Medical History  Diagnosis Date  . GERD (gastroesophageal reflux disease)   . Hyperlipidemia   . Hypertension   . Nephrolithiasis   . Paroxysmal atrial fibrillation (HCC)     declines anticoagulation, chads2vasc score is at least 3  . Heart valve problem     aortic stenosis, aortic regurgitation (bicuspid aortic valve)  . CAD (coronary artery disease)     LAD dz but no corresponding ischemia on myoview  . Aortic aneurysm, thoracic (HCC)     bicuspid aortic valve. aortic stenosis,  . H/O hiatal hernia   . Wears glasses   . Fatty liver 04/2012; 06/2013    Noted on noncontrast abd CT done during trauma w/u when tractor rolled onto his knee.  Also noted on noncontrast chest CT done to screen for asbestos lung damage.  . Asbestos exposure CT 06/2013    Noncalcified pleural plaques bilat; no signs of malignancy.  . Chronic renal insufficiency, stage III (moderate) 06/2013    CrCl about 50 ml/min  . Prediabetes 2015    A1c 6.4%   Past Surgical History  Procedure Laterality Date  . Esophagogastroduodenoscopy  02/23/2011    Barrett's esophagus, hiatal hernia - dilated for dysphagia  . Colonoscopy    . Tendon repair  11/06/2011    Procedure:  TENDON REPAIR;  Surgeon: Cammie Sickle., MD;  Location: [facility];  Service: Orthopedics;  Laterality: Left;  explore/repair tendons left hand   . Cardiac catheterization  06/24/10  . Circumcision  01/18/2012    Procedure: CIRCUMCISION ADULT;  Surgeon: [name], MD;  Location: [facility];  Service: Urology;  Laterality: N/A;  30 mins requested for this case   . Shoulder surgery      Arthroscopic 02/23/12  . Implantable loop recorder placement  06/29/14    MDT LINQ implanted by Dr [name] in the office as part of the RIO II protocol     Current Outpatient Prescriptions  Medication Sig Dispense Refill  . apixaban (ELIQUIS) 5 MG TABS tablet Take 1 tablet (5 mg total) by mouth 2 (two) times daily. 60 tablet 2  . arginine 500 MG tablet Take 500 mg by mouth daily. Cardio for Life supplement    . atorvastatin (LIPITOR) 40 MG tablet TAKE ONE TABLET BY MOUTH ONCE DAILY 90 tablet 3  . benazepril-hydrochlorthiazide (LOTENSIN HCT) 20-12.5 MG tablet TAKE TWO TABLETS BY MOUTH ONCE DAILY 180 tablet 0  . metoprolol tartrate (LOPRESSOR) 25 MG tablet Take 25 mg by mouth daily as needed (PALPITATIONS).    Marland Kitchen omeprazole (PRILOSEC) 40 MG capsule Take 1 capsule (40 mg total) by mouth daily. 90 capsule 3   No current facility-administered medications for this visit.    Allergies:  Review of patient's allergies indicates no known allergies.   Social History:  The patient  reports that he quit smoking about 21 years ago. He has never used smokeless tobacco. He reports that he does not drink alcohol or use illicit drugs.   Family History:  The patient's family history includes Arthritis in his mother; Asthma in his father; Diabetes in his mother; Emphysema in his father; Heart attack (age of onset: 24) in his father; Heart disease in his father; Stroke in his mother. There is no history of Hyperlipidemia or Hypertension.    ROS:  Please see the history of present illness.    All other systems are reviewed and negative.    PHYSICAL EXAM: VS:  BP 108/72 mmHg  Pulse 70  Ht 5\' 11"  (1.803 m)  Wt 212 lb 9.6 oz (96.435 kg)  BMI 29.66 kg/m2 , BMI Body mass index is 29.66 kg/(m^2). GEN: Well nourished, well developed, in no acute distress HEENT: normal Neck: no JVD, carotid bruits, or masses Cardiac: RRR; + 2/6 diastolic murmur LUSB, 2/6 SEM LUSB, no rubs, or gallops,no edema  Respiratory:  clear to auscultation bilaterally, normal work of breathing GI: soft, nontender, nondistended, + BS MS: no deformity or atrophy Skin: warm and dry, ILR site is well healed Neuro:  Strength and sensation are intact Psych: euthymic mood, full affect  Device interrogation is reviewed today in detail.  See PaceArt for details.   Recent Labs: 08/06/2014: ALT 29; BUN 22; Creatinine, Ser 1.17; Potassium 4.5; Sodium 141; TSH 1.90    Lipid Panel     Component Value Date/Time   CHOL 137 08/06/2014 1413   TRIG 63.0 08/06/2014 1413   HDL 41.90 08/06/2014 1413   CHOLHDL 3 08/06/2014 1413   VLDL 12.6 08/06/2014 1413   LDLCALC 83 08/06/2014 1413     Wt Readings from Last 3 Encounters:  07/22/15 212 lb 9.6 oz (96.435 kg)  10/22/14 210 lb (95.255 kg)  10/03/14 210 lb (95.255 kg)     ASSESSMENT AND PLAN:  1.  Atrial fibrillation Confirmed with ILR implant.  Burden is 0.7%. This patients CHA2DS2-VASc Score and unadjusted Ischemic Stroke Rate (% per year) is equal to 3.2 % stroke rate/year from a score of 3  We had a long discussion today about risks of recurrent stroke and benefits of anticoagulation.  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  He does not feel that he can take anticoagulation long term.  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  The patients would be a  candidate for short term oral anticoagulation.  Procedural risks for the Watchman implant have been reviewed with the patient including a 1% risk of stroke, 2% risk of perforation, 0.1% risk of device embolization.  Given the patient's poor candidacy for long-term oral anticoagulation, ability to tolerate short term oral anticoagulation, I have recommended the watchman left atrial appendage closure system.  He is very excited about his option. TEE will be scheduled to review LAA anatomy and to also better visualize his aortic valve which was not well seen on echo 4/16.  The patient understands that the ability to implant Watchman is dependent on results of the TEE.  If patient is candidate for Watchman based on TEE results, we will schedule  the procedure at the next available time.   Today, I have spent 25 minutes with the patient discussing afib and anticoagulation .  More than 50% of the visit time today was spent on this issue.  2. Aortic valve disease Will assess with TEE as above Follow up with Dr [name] as scheduled  3. HTN Stable No change required today   I will follow remotely with carelink and see pt as needed going forward if he decides not to proceed with left atrial appendage closure  Further care per Dr [name]  Current medicines are reviewed at length with the patient today.   The patient does not have concerns regarding his medicines.  The following changes were made today:  none  Labs/ tests ordered today include:  Orders Placed This Encounter  Procedures  . Implantable device check    Signed, [name], MD  07/22/2015 2:40 PM     [place] [address] [phone] (office) [phone] (fax)

## 2015-07-22 NOTE — Patient Instructions (Signed)
Medication Instructions:  Your physician recommends that you continue on your current medications as directed. Please refer to the Current Medication list given to you today.   Labwork: None ordered   Testing/Procedures: Your physician has requested that you have a TEE. During a TEE, sound waves are used to create images of your heart. It provides your doctor with information about the size and shape of your heart and how well your heart's chambers and valves are working. In this test, a transducer is attached to the end of a flexible tube that's guided down your throat and into your esophagus (the tube leading from you mouth to your stomach) to get a more detailed image of your heart. You are not awake for the procedure. Please see the instruction sheet given to you today. For further information please visit [email or web address].  Please check in at the [facility] at 6:30am.  Do not eat or drink after midnight the night before our procedure.  You will need someone to drive you home after the procedure.  Okay to take your medications the morning of your procedure.    Chanetta Marshall, NP will call you after the TEE     Follow-Up:  Your physician recommends that you schedule a follow-up appointment as needed    Any Other Special Instructions Will Be Listed Below (If Applicable).     If you need a refill on your cardiac medications before your next appointment, please call your pharmacy.

## 2015-07-23 NOTE — Progress Notes (Signed)
I would be fine with Watchman for him, think that's a good choice.    Amber, can you see if you can get the TEE in with me? Want to work on the measurements.

## 2015-07-24 ENCOUNTER — Ambulatory Visit (INDEPENDENT_AMBULATORY_CARE_PROVIDER_SITE_OTHER): Payer: Medicare Other | Admitting: *Deleted

## 2015-07-24 DIAGNOSIS — I480 Paroxysmal atrial fibrillation: Secondary | ICD-10-CM

## 2015-07-25 ENCOUNTER — Ambulatory Visit (HOSPITAL_COMMUNITY)
Admission: RE | Admit: 2015-07-25 | Discharge: 2015-07-25 | Disposition: A | Discharge status: Home / Self Care | Payer: Medicare Other | Source: Ambulatory Visit | Attending: Cardiology | Admitting: Cardiology

## 2015-07-25 ENCOUNTER — Ambulatory Visit (HOSPITAL_BASED_OUTPATIENT_CLINIC_OR_DEPARTMENT_OTHER): Payer: Medicare Other

## 2015-07-25 ENCOUNTER — Encounter (HOSPITAL_COMMUNITY)
Admission: RE | Disposition: A | Discharge status: Home / Self Care | Payer: Self-pay | Source: Ambulatory Visit | Attending: Cardiology

## 2015-07-25 ENCOUNTER — Encounter (HOSPITAL_COMMUNITY): Payer: Self-pay

## 2015-07-25 DIAGNOSIS — R7303 Prediabetes: Secondary | ICD-10-CM | POA: Diagnosis not present

## 2015-07-25 DIAGNOSIS — I480 Paroxysmal atrial fibrillation: Secondary | ICD-10-CM | POA: Diagnosis not present

## 2015-07-25 DIAGNOSIS — N183 Chronic kidney disease, stage 3 (moderate): Secondary | ICD-10-CM | POA: Diagnosis not present

## 2015-07-25 DIAGNOSIS — E785 Hyperlipidemia, unspecified: Secondary | ICD-10-CM | POA: Insufficient documentation

## 2015-07-25 DIAGNOSIS — I2510 Atherosclerotic heart disease of native coronary artery without angina pectoris: Secondary | ICD-10-CM | POA: Insufficient documentation

## 2015-07-25 DIAGNOSIS — Z79899 Other long term (current) drug therapy: Secondary | ICD-10-CM | POA: Insufficient documentation

## 2015-07-25 DIAGNOSIS — Q231 Congenital insufficiency of aortic valve: Secondary | ICD-10-CM | POA: Diagnosis not present

## 2015-07-25 DIAGNOSIS — Z87891 Personal history of nicotine dependence: Secondary | ICD-10-CM | POA: Insufficient documentation

## 2015-07-25 DIAGNOSIS — Z7901 Long term (current) use of anticoagulants: Secondary | ICD-10-CM | POA: Insufficient documentation

## 2015-07-25 DIAGNOSIS — K219 Gastro-esophageal reflux disease without esophagitis: Secondary | ICD-10-CM | POA: Insufficient documentation

## 2015-07-25 DIAGNOSIS — I350 Nonrheumatic aortic (valve) stenosis: Secondary | ICD-10-CM | POA: Diagnosis not present

## 2015-07-25 DIAGNOSIS — I129 Hypertensive chronic kidney disease with stage 1 through stage 4 chronic kidney disease, or unspecified chronic kidney disease: Secondary | ICD-10-CM | POA: Diagnosis not present

## 2015-07-25 DIAGNOSIS — I4891 Unspecified atrial fibrillation: Secondary | ICD-10-CM | POA: Diagnosis present

## 2015-07-25 HISTORY — PX: TEE WITHOUT CARDIOVERSION: SHX5443

## 2015-07-25 SURGERY — ECHOCARDIOGRAM, TRANSESOPHAGEAL
Anesthesia: Moderate Sedation

## 2015-07-25 MED ORDER — MIDAZOLAM HCL 10 MG/2ML IJ SOLN
INTRAMUSCULAR | Status: DC | PRN
  Administered 2015-07-25 (×2): 2 mg via INTRAVENOUS
  Administered 2015-07-25: 1 mg via INTRAVENOUS

## 2015-07-25 MED ORDER — MIDAZOLAM HCL 5 MG/ML IJ SOLN
INTRAMUSCULAR | Status: AC
  Filled 2015-07-25: qty 2

## 2015-07-25 MED ORDER — BUTAMBEN-TETRACAINE-BENZOCAINE 2-2-14 % EX AERO
INHALATION_SPRAY | CUTANEOUS | Status: DC | PRN
Start: 2015-07-25 — End: 2015-07-25
  Administered 2015-07-25: 2 via TOPICAL

## 2015-07-25 MED ORDER — FENTANYL CITRATE (PF) 100 MCG/2ML IJ SOLN
INTRAMUSCULAR | Status: AC
  Filled 2015-07-25: qty 2

## 2015-07-25 MED ORDER — FENTANYL CITRATE (PF) 100 MCG/2ML IJ SOLN
INTRAMUSCULAR | Status: DC | PRN
  Administered 2015-07-25 (×2): 25 ug via INTRAVENOUS

## 2015-07-25 MED ORDER — SODIUM CHLORIDE 0.9 % IV SOLN: INTRAVENOUS | Status: DC

## 2015-07-25 MED ORDER — DIPHENHYDRAMINE HCL 50 MG/ML IJ SOLN
INTRAMUSCULAR | Status: AC
  Filled 2015-07-25: qty 1

## 2015-07-25 NOTE — Discharge Instructions (Signed)

## 2015-07-25 NOTE — Progress Notes (Signed)
  Echocardiogram Echocardiogram Transesophageal has been performed.  Bobbye Charleston 07/25/2015, 8:49 AM

## 2015-07-25 NOTE — Progress Notes (Signed)
Carelink Summary Report / Loop Recorder 

## 2015-07-25 NOTE — Interval H&P Note (Signed)
History and Physical Interval Note:  07/25/2015 8:12 AM  Samuel Willis  has presented today for surgery, with the diagnosis of AFIB  The various methods of treatment have been discussed with the patient and family. After consideration of risks, benefits and other options for treatment, the patient has consented to  Procedure(s): TRANSESOPHAGEAL ECHOCARDIOGRAM (TEE) (N/A) as a surgical intervention .  The patient's history has been reviewed, patient examined, no change in status, stable for surgery.  I have reviewed the patient's chart and labs.  Questions were answered to the patient's satisfaction.     Zulay Corrie [employer]

## 2015-07-25 NOTE — CV Procedure (Signed)
Procedure: TEE  Indication: Atrial fibrillation, pre-Watchman  Sedation: 5 mg IV Versed, 50 mcg IV Fentanyl.   Findings: Please see echo section for full report.  Normal LV size with mild LV hypertrophy.  EF 60-65%.  No regional wall motion abnormalities.  Normal RV size and systolic function.  Trivial TR.  Trivial MR.  Mild left atrial enlargement.  No LA appendage thrombus.  Multiple measurements taken of LAA appendage for Watchman sizing.  Functionally bicuspid aortic valve with fused right and noncoronary cusps.  Mild aortic insufficiency and mild aortic stenosis with mean gradient 10 mmHg.  Lipomatous atrial septal hypertrophy with no ASD or PFO. The ascending aorta was mildly dilated to 4.0 cm.  The descending thoracic aorta had grade III plaque.   [name] 07/25/2015 8:45 AM

## 2015-07-25 NOTE — H&P (View-Only) (Signed)
Electrophysiology Office Note   Date:  07/22/2015   ID:  Samuel Willis, DOB 10-May-1946, MRN [id number]  PCP:  Tammi Sou, MD  Cardiologist:  DR [name] Primary Electrophysiologist: [name], MD    Chief Complaint  Patient presents with  . Atrial Fibrillation     History of Present Illness: Samuel Willis is a 70 y.o. male who presents today for electrophysiology evaluation.   He has done well since I saw him last.  Remains active.  He stable SOB.  Rare palpitations.  Today, he denies symptoms of chest pain, orthopnea, PND, lower extremity edema, claudication, dizziness, presyncope, syncope, bleeding, or neurologic sequela. The patient is tolerating medications without difficulties and is otherwise without complaint today.    Past Medical History  Diagnosis Date  . GERD (gastroesophageal reflux disease)   . Hyperlipidemia   . Hypertension   . Nephrolithiasis   . Paroxysmal atrial fibrillation (HCC)     declines anticoagulation, chads2vasc score is at least 3  . Heart valve problem     aortic stenosis, aortic regurgitation (bicuspid aortic valve)  . CAD (coronary artery disease)     LAD dz but no corresponding ischemia on myoview  . Aortic aneurysm, thoracic (HCC)     bicuspid aortic valve. aortic stenosis,  . H/O hiatal hernia   . Wears glasses   . Fatty liver 04/2012; 06/2013    Noted on noncontrast abd CT done during trauma w/u when tractor rolled onto his knee.  Also noted on noncontrast chest CT done to screen for asbestos lung damage.  . Asbestos exposure CT 06/2013    Noncalcified pleural plaques bilat; no signs of malignancy.  . Chronic renal insufficiency, stage III (moderate) 06/2013    CrCl about 50 ml/min  . Prediabetes 2015    A1c 6.4%   Past Surgical History  Procedure Laterality Date  . Esophagogastroduodenoscopy  02/23/2011    Barrett's esophagus, hiatal hernia - dilated for dysphagia  . Colonoscopy    . Tendon repair  11/06/2011    Procedure:  TENDON REPAIR;  Surgeon: Cammie Sickle., MD;  Location: [place];  Service: Orthopedics;  Laterality: Left;  explore/repair tendons left hand   . Cardiac catheterization  06/24/10  . Circumcision  01/18/2012    Procedure: CIRCUMCISION ADULT;  Surgeon: [name], MD;  Location: [facility];  Service: Urology;  Laterality: N/A;  30 mins requested for this case   . Shoulder surgery      Arthroscopic 02/23/12  . Implantable loop recorder placement  06/29/14    MDT LINQ implanted by Dr [name] in the office as part of the RIO II protocol     Current Outpatient Prescriptions  Medication Sig Dispense Refill  . apixaban (ELIQUIS) 5 MG TABS tablet Take 1 tablet (5 mg total) by mouth 2 (two) times daily. 60 tablet 2  . arginine 500 MG tablet Take 500 mg by mouth daily. Cardio for Life supplement    . atorvastatin (LIPITOR) 40 MG tablet TAKE ONE TABLET BY MOUTH ONCE DAILY 90 tablet 3  . benazepril-hydrochlorthiazide (LOTENSIN HCT) 20-12.5 MG tablet TAKE TWO TABLETS BY MOUTH ONCE DAILY 180 tablet 0  . metoprolol tartrate (LOPRESSOR) 25 MG tablet Take 25 mg by mouth daily as needed (PALPITATIONS).    Marland Kitchen omeprazole (PRILOSEC) 40 MG capsule Take 1 capsule (40 mg total) by mouth daily. 90 capsule 3   No current facility-administered medications for this visit.    Allergies:  Review of patient's allergies indicates no known allergies.   Social History:  The patient  reports that he quit smoking about 21 years ago. He has never used smokeless tobacco. He reports that he does not drink alcohol or use illicit drugs.   Family History:  The patient's family history includes Arthritis in his mother; Asthma in his father; Diabetes in his mother; Emphysema in his father; Heart attack (age of onset: 74) in his father; Heart disease in his father; Stroke in his mother. There is no history of Hyperlipidemia or Hypertension.    ROS:  Please see the history of present illness.    All other systems are reviewed and negative.    PHYSICAL EXAM: VS:  BP 108/72 mmHg  Pulse 70  Ht 5\' 11"  (1.803 m)  Wt 212 lb 9.6 oz (96.435 kg)  BMI 29.66 kg/m2 , BMI Body mass index is 29.66 kg/(m^2). GEN: Well nourished, well developed, in no acute distress HEENT: normal Neck: no JVD, carotid bruits, or masses Cardiac: RRR; + 2/6 diastolic murmur LUSB, 2/6 SEM LUSB, no rubs, or gallops,no edema  Respiratory:  clear to auscultation bilaterally, normal work of breathing GI: soft, nontender, nondistended, + BS MS: no deformity or atrophy Skin: warm and dry, ILR site is well healed Neuro:  Strength and sensation are intact Psych: euthymic mood, full affect  Device interrogation is reviewed today in detail.  See PaceArt for details.   Recent Labs: 08/06/2014: ALT 29; BUN 22; Creatinine, Ser 1.17; Potassium 4.5; Sodium 141; TSH 1.90    Lipid Panel     Component Value Date/Time   CHOL 137 08/06/2014 1413   TRIG 63.0 08/06/2014 1413   HDL 41.90 08/06/2014 1413   CHOLHDL 3 08/06/2014 1413   VLDL 12.6 08/06/2014 1413   LDLCALC 83 08/06/2014 1413     Wt Readings from Last 3 Encounters:  07/22/15 212 lb 9.6 oz (96.435 kg)  10/22/14 210 lb (95.255 kg)  10/03/14 210 lb (95.255 kg)     ASSESSMENT AND PLAN:  1.  Atrial fibrillation Confirmed with ILR implant.  Burden is 0.7%. This patients CHA2DS2-VASc Score and unadjusted Ischemic Stroke Rate (% per year) is equal to 3.2 % stroke rate/year from a score of 3  We had a long discussion today about risks of recurrent stroke and benefits of anticoagulation.  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  He does not feel that he can take anticoagulation long term.  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  The patients would be a  candidate for short term oral anticoagulation.  Procedural risks for the Watchman implant have been reviewed with the patient including a 1% risk of stroke, 2% risk of perforation, 0.1% risk of device embolization.  Given the patient's poor candidacy for long-term oral anticoagulation, ability to tolerate short term oral anticoagulation, I have recommended the watchman left atrial appendage closure system.  He is very excited about his option. TEE will be scheduled to review LAA anatomy and to also better visualize his aortic valve which was not well seen on echo 4/16.  The patient understands that the ability to implant Watchman is dependent on results of the TEE.  If patient is candidate for Watchman based on TEE results, we will schedule  the procedure at the next available time.   Today, I have spent 25 minutes with the patient discussing afib and anticoagulation .  More than 50% of the visit time today was spent on this issue.  2. Aortic valve disease Will assess with TEE as above Follow up with Dr [name] as scheduled  3. HTN Stable No change required today   I will follow remotely with carelink and see pt as needed going forward if he decides not to proceed with left atrial appendage closure  Further care per Dr [name]  Current medicines are reviewed at length with the patient today.   The patient does not have concerns regarding his medicines.  The following changes were made today:  none  Labs/ tests ordered today include:  Orders Placed This Encounter  Procedures  . Implantable device check    Signed, [name], MD  07/22/2015 2:40 PM     [place] [address] [phone] (office) [phone] (fax)

## 2015-07-26 ENCOUNTER — Encounter (HOSPITAL_COMMUNITY): Payer: Self-pay | Admitting: Cardiology

## 2015-07-28 ENCOUNTER — Encounter (HOSPITAL_COMMUNITY): Payer: Self-pay | Admitting: Family Medicine

## 2015-07-30 ENCOUNTER — Telehealth: Payer: Self-pay | Admitting: Nurse Practitioner

## 2015-07-30 NOTE — Telephone Encounter (Signed)
TEE images reviewed. Question of enough depth in appendage to place Watchman device. Dr Burt Knack and Allred discussed and recommend cardiac CT to further evaluate LAA prior to scheduling Watchman.  Left message for patient to call to discuss.   Chanetta Marshall, NP 07/30/2015 9:52 AM

## 2015-08-02 NOTE — Telephone Encounter (Signed)
Left message for patient to call  Chanetta Marshall, NP 08/02/2015 11:25 AM

## 2015-08-14 LAB — CUP PACEART REMOTE DEVICE CHECK: MDC IDC SESS DTM: 20170106002856

## 2015-08-14 NOTE — Progress Notes (Signed)
Carelink summary report received. Battery status OK. Normal device function. No new symptom episodes, tachy episodes, brady, or pause episodes. No new AF episodes. Monthly summary reports and ROV/PRN 

## 2015-08-19 ENCOUNTER — Telehealth: Payer: Self-pay | Admitting: Internal Medicine

## 2015-08-19 ENCOUNTER — Ambulatory Visit: Payer: Medicare Other | Admitting: Family Medicine

## 2015-08-19 ENCOUNTER — Telehealth: Payer: Self-pay | Admitting: Family Medicine

## 2015-08-19 DIAGNOSIS — Z125 Encounter for screening for malignant neoplasm of prostate: Secondary | ICD-10-CM

## 2015-08-19 DIAGNOSIS — I482 Chronic atrial fibrillation, unspecified: Secondary | ICD-10-CM

## 2015-08-19 DIAGNOSIS — E039 Hypothyroidism, unspecified: Secondary | ICD-10-CM

## 2015-08-19 DIAGNOSIS — R739 Hyperglycemia, unspecified: Secondary | ICD-10-CM

## 2015-08-19 DIAGNOSIS — R350 Frequency of micturition: Secondary | ICD-10-CM

## 2015-08-19 DIAGNOSIS — Z0000 Encounter for general adult medical examination without abnormal findings: Secondary | ICD-10-CM

## 2015-08-19 DIAGNOSIS — E785 Hyperlipidemia, unspecified: Secondary | ICD-10-CM

## 2015-08-19 NOTE — Telephone Encounter (Signed)
lmom for patient to return my call 

## 2015-08-19 NOTE — Telephone Encounter (Signed)
Wanted to speak with the nurse about the procedure in which he had back in February .   Thanks

## 2015-08-19 NOTE — Telephone Encounter (Signed)
Patient would like to come in for his blood work prior to his 08/26/15 OV. Please contact him.

## 2015-08-20 LAB — CUP PACEART REMOTE DEVICE CHECK: Date Time Interrogation Session: 20170208213657

## 2015-08-20 NOTE — Progress Notes (Signed)
Carelink summary report received. Battery status OK. Normal device function. No new symptom episodes, tachy episodes, brady, or pause episodes. No new AF episodes. Monthly summary reports and ROV/PRN 

## 2015-08-20 NOTE — Telephone Encounter (Signed)
Forwarding

## 2015-08-20 NOTE — Telephone Encounter (Signed)
Diane to call pt and change appt time to 10:15 am. Pt is due for an annual visit and was put in for a 15 min slot.  Diane to also add pt to lab schedule for Friday morning.   Lab orders pended for PCP review.

## 2015-08-21 NOTE — Telephone Encounter (Signed)
The lab orders look fine.-thx

## 2015-08-21 NOTE — Telephone Encounter (Signed)
Called pt and informed him that his labs were entered.

## 2015-08-23 ENCOUNTER — Other Ambulatory Visit (INDEPENDENT_AMBULATORY_CARE_PROVIDER_SITE_OTHER): Payer: Medicare Other

## 2015-08-23 ENCOUNTER — Ambulatory Visit (INDEPENDENT_AMBULATORY_CARE_PROVIDER_SITE_OTHER): Payer: Medicare Other | Admitting: *Deleted

## 2015-08-23 DIAGNOSIS — Z0000 Encounter for general adult medical examination without abnormal findings: Secondary | ICD-10-CM

## 2015-08-23 DIAGNOSIS — I480 Paroxysmal atrial fibrillation: Secondary | ICD-10-CM | POA: Diagnosis not present

## 2015-08-23 DIAGNOSIS — E039 Hypothyroidism, unspecified: Secondary | ICD-10-CM

## 2015-08-23 DIAGNOSIS — R739 Hyperglycemia, unspecified: Secondary | ICD-10-CM

## 2015-08-23 DIAGNOSIS — R350 Frequency of micturition: Secondary | ICD-10-CM | POA: Diagnosis not present

## 2015-08-23 DIAGNOSIS — Z125 Encounter for screening for malignant neoplasm of prostate: Secondary | ICD-10-CM

## 2015-08-23 DIAGNOSIS — E785 Hyperlipidemia, unspecified: Secondary | ICD-10-CM | POA: Diagnosis not present

## 2015-08-23 DIAGNOSIS — I482 Chronic atrial fibrillation, unspecified: Secondary | ICD-10-CM

## 2015-08-23 LAB — COMPREHENSIVE METABOLIC PANEL
ALBUMIN: 4.5 g/dL (ref 3.5–5.2)
ALT: 24 U/L (ref 0–53)
AST: 18 U/L (ref 0–37)
Alkaline Phosphatase: 41 U/L (ref 39–117)
BILIRUBIN TOTAL: 0.7 mg/dL (ref 0.2–1.2)
BUN: 30 mg/dL — ABNORMAL HIGH (ref 6–23)
CALCIUM: 9.5 mg/dL (ref 8.4–10.5)
CHLORIDE: 105 meq/L (ref 96–112)
CO2: 26 mEq/L (ref 19–32)
CREATININE: 1.32 mg/dL (ref 0.40–1.50)
GFR: 57.15 mL/min — AB (ref >60.00)
Glucose, Bld: 86 mg/dL (ref 70–99)
Potassium: 5.4 mEq/L — ABNORMAL HIGH (ref 3.5–5.1)
Sodium: 140 mEq/L (ref 135–145)
Total Protein: 6.5 g/dL (ref 6.0–8.3)

## 2015-08-23 LAB — HEPATIC FUNCTION PANEL
ALK PHOS: 41 U/L (ref 39–117)
ALT: 24 U/L (ref 0–53)
AST: 18 U/L (ref 0–37)
Albumin: 4.5 g/dL (ref 3.5–5.2)
BILIRUBIN DIRECT: 0.2 mg/dL (ref 0.0–0.3)
BILIRUBIN TOTAL: 0.7 mg/dL (ref 0.2–1.2)
Total Protein: 6.5 g/dL (ref 6.0–8.3)

## 2015-08-23 LAB — LIPID PANEL
CHOL/HDL RATIO: 3
Cholesterol: 128 mg/dL (ref 0–200)
HDL: 44.1 mg/dL (ref >39.00)
LDL Cholesterol: 72 mg/dL (ref 0–99)
NONHDL: 83.52
Triglycerides: 59 mg/dL (ref 0.0–149.0)
VLDL: 11.8 mg/dL (ref 0.0–40.0)

## 2015-08-23 LAB — HEMOGLOBIN A1C: HEMOGLOBIN A1C: 6.1 % (ref 4.6–6.5)

## 2015-08-23 LAB — PSA: PSA: 2.01 ng/mL (ref 0.10–4.00)

## 2015-08-23 LAB — TSH: TSH: 1.58 u[IU]/mL (ref 0.35–4.50)

## 2015-08-26 ENCOUNTER — Other Ambulatory Visit: Payer: Self-pay | Admitting: Nurse Practitioner

## 2015-08-26 ENCOUNTER — Ambulatory Visit (INDEPENDENT_AMBULATORY_CARE_PROVIDER_SITE_OTHER): Payer: Medicare Other | Admitting: Family Medicine

## 2015-08-26 ENCOUNTER — Encounter: Payer: Self-pay | Admitting: Family Medicine

## 2015-08-26 ENCOUNTER — Encounter: Payer: Self-pay | Admitting: Internal Medicine

## 2015-08-26 VITALS — BP 105/72 | HR 68 | Temp 98.2°F | Resp 16 | Ht 70.5 in | Wt 201.0 lb

## 2015-08-26 DIAGNOSIS — Z23 Encounter for immunization: Secondary | ICD-10-CM

## 2015-08-26 DIAGNOSIS — I480 Paroxysmal atrial fibrillation: Secondary | ICD-10-CM

## 2015-08-26 DIAGNOSIS — Z0000 Encounter for general adult medical examination without abnormal findings: Secondary | ICD-10-CM

## 2015-08-26 MED ORDER — ZOSTER VACCINE LIVE 19400 UNT/0.65ML ~~LOC~~ SOLR: 0.6500 mL | Freq: Once | SUBCUTANEOUS | Status: DC

## 2015-08-26 NOTE — Progress Notes (Signed)
Pre visit review using our clinic review tool, if applicable. No additional management support is needed unless otherwise documented below in the visit note. 

## 2015-08-26 NOTE — Addendum Note (Signed)
Addended by: Onalee Hua on: 08/26/2015 11:15 AM   Modules accepted: Orders

## 2015-08-26 NOTE — Progress Notes (Signed)
Carelink Summary Report / Loop Recorder 

## 2015-08-26 NOTE — Progress Notes (Signed)
The patient is here for annual Medicare wellness examination and management of other chronic and acute problems. Other problems discussed today: BP's reviewed and these show low-normal to normal readings.  He sometimes cuts back to one of his lotensin HCT tabs per day.  We reviewed recent health panel + PSA+HbA1c today. Potassium was 5.4 and pt admits he has been taking a dietary supplement lately that is high in potassium and says he'll stop taking this now.  All other labs were great/stable.   AWV DATA The risk factors are reflected in the social history.  The roster of all physicians providing medical care to patient is listed in the Snapshot section of the chart.  Activities of daily living:  The patient is 100% independent in all ADLs: dressing, toileting, feeding as well as independent mobility.  Home safety : The patient has smoke detectors in the home. They wear seatbelts. No firearms at home ( firearms are present in the home, kept in a safe fashion). There is no violence in the home.   There is no risks for hepatitis, STDs or HIV. There is no history of blood transfusion. They have no travel history to infectious disease endemic areas of the world.  The patient has seen their dentist in the last six month. They have not seen their eye doctor in the last year but he plans on going soon. They admit to hearing difficulty and have not had audiologic testing in the last year.  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    They do not  have excessive sun exposure. Discussed the need for sun protection: hats, long sleeves and use of sunscreen if there is significant sun exposure.   Diet: the importance of a healthy diet is discussed. They do have a healthy (unhealthy-high fat/fast food) diet.  The patient has a regular exercise program: strength 3 days per week and walks/does steps 5 d/wk.  The benefits of regular aerobic exercise were  discussed.  Depression screen: there are no signs or vegative symptoms of depression- irritability, change in appetite, anhedonia, sadness/tearfullness.  Cognitive assessment: the patient manages all their financial and personal affairs and is actively engaged. They could relate day,date,year and events; recalled 3/3 objects at 3 minutes; performed clock-face test normally.  Reviewed advanced directives with pt today: he has this at home..  The following portions of the patient's history were reviewed and updated as appropriate: allergies, current medications, past family history, past medical history,  past surgical history, past social history  and problem list.  Vision, hearing, body mass index were assessed and reviewed..   During the course of the visit the patient was educated and counseled about appropriate screening and preventive services including :  Annual wellness visit --TODAY diabetes screening-Reviewed results today (fasting glucose normal, HbA1c 6.0%). colorectal cancer screening: will need to track down records of his previous colonoscopy, then will determine when next colonoscopy needed. recommended immunizations (influenza, pneumococcal, Hep B)--UTD except HepB vaccine, which is not indicated for this pt. Bone mass measurement--NA Counseling to prevent tobacco use--NA Depression screening--done today (NEG) Glaucoma screening--gets done through his eye MD, whom he plans on getting appt with soon. Hepatitis C virus screening--pt declined. HIV virus screening: pt declined. Lung cancer screening: pt does not fulfill criteria for this screening procedure. Medical nutrition therapy: NA Prostate cancer screening: PSA done and reviewed today (normal) and pt declined DRE today. Screening mammography: NA Screening pap tests, pelvic exam,  and clinical breast exam: NA Ultrasound screening for AAA: not indicated.  A written plan of action regarding the above screening and  preventative services was given to the patient today.  Plan: pt got flu vaccine today and I will track down records of his colonoscopy to come up with a plan for next colon cancer screening.  He will make appt with his eye MD soon. Additionally, although not covered by medicare, pt got rx for zostavax rx today.  F/u 6 mo for chronic illness f/u.

## 2015-08-27 ENCOUNTER — Other Ambulatory Visit: Payer: Self-pay | Admitting: *Deleted

## 2015-08-27 DIAGNOSIS — I480 Paroxysmal atrial fibrillation: Secondary | ICD-10-CM

## 2015-08-27 NOTE — Telephone Encounter (Signed)
Cardiac CT ordered

## 2015-08-30 ENCOUNTER — Telehealth: Payer: Self-pay | Admitting: Family Medicine

## 2015-08-30 DIAGNOSIS — I480 Paroxysmal atrial fibrillation: Secondary | ICD-10-CM

## 2015-08-30 MED ORDER — ATORVASTATIN CALCIUM 40 MG PO TABS: ORAL_TABLET | ORAL | Status: DC

## 2015-08-30 MED ORDER — OMEPRAZOLE 40 MG PO CPDR: 40.0000 mg | DELAYED_RELEASE_CAPSULE | Freq: Every day | ORAL | Status: DC

## 2015-08-30 MED ORDER — BENAZEPRIL-HYDROCHLOROTHIAZIDE 20-12.5 MG PO TABS: 2.0000 | ORAL_TABLET | Freq: Every day | ORAL | Status: DC

## 2015-08-30 MED ORDER — APIXABAN 5 MG PO TABS: 5.0000 mg | ORAL_TABLET | Freq: Two times a day (BID) | ORAL | Status: DC

## 2015-08-30 NOTE — Telephone Encounter (Signed)
Apixaban Atorvastatin Benapril Omeprazole 90 day supply [employer] [facility]

## 2015-08-30 NOTE — Telephone Encounter (Signed)
LOV: 08/26/15 NOV: None  RF request for apixaban Last written: 10/22/14 #60 w/ 2RF  RF request for atorvastatin Last written: 05/13/15 #180 w/ 0RF  RF request for benzapril Last written: 05/13/15 #180 w/ 0RF  RF request for omeprazole Last written: 08/06/14 #90 w/ 3RF   Rx's sent. Pts wife advised and voiced understanding, okay per DPR.

## 2015-09-02 ENCOUNTER — Encounter: Payer: Self-pay | Admitting: Internal Medicine

## 2015-09-09 ENCOUNTER — Ambulatory Visit (HOSPITAL_COMMUNITY)
Admission: RE | Admit: 2015-09-09 | Discharge: 2015-09-09 | Disposition: A | Discharge status: Home / Self Care | Payer: Medicare Other | Source: Ambulatory Visit | Attending: Nurse Practitioner | Admitting: Nurse Practitioner

## 2015-09-09 ENCOUNTER — Encounter (HOSPITAL_COMMUNITY): Payer: Self-pay

## 2015-09-09 DIAGNOSIS — I2510 Atherosclerotic heart disease of native coronary artery without angina pectoris: Secondary | ICD-10-CM | POA: Insufficient documentation

## 2015-09-09 DIAGNOSIS — I517 Cardiomegaly: Secondary | ICD-10-CM | POA: Insufficient documentation

## 2015-09-09 DIAGNOSIS — I77810 Thoracic aortic ectasia: Secondary | ICD-10-CM | POA: Insufficient documentation

## 2015-09-09 DIAGNOSIS — I480 Paroxysmal atrial fibrillation: Secondary | ICD-10-CM

## 2015-09-09 DIAGNOSIS — I358 Other nonrheumatic aortic valve disorders: Secondary | ICD-10-CM | POA: Insufficient documentation

## 2015-09-09 MED ORDER — IOPAMIDOL (ISOVUE-370) INJECTION 76%
INTRAVENOUS | Status: AC
Start: 2015-09-09 — End: 2015-09-09
  Administered 2015-09-09: 80 mL
  Filled 2015-09-09: qty 100

## 2015-09-09 MED ORDER — NITROGLYCERIN 0.4 MG SL SUBL
SUBLINGUAL_TABLET | SUBLINGUAL | Status: AC
  Filled 2015-09-09: qty 1

## 2015-09-09 MED ORDER — NITROGLYCERIN 0.4 MG SL SUBL
0.4000 mg | SUBLINGUAL_TABLET | SUBLINGUAL | Status: DC | PRN
  Administered 2015-09-09: 0.4 mg via SUBLINGUAL

## 2015-09-10 ENCOUNTER — Encounter: Payer: Self-pay | Admitting: Nurse Practitioner

## 2015-09-23 ENCOUNTER — Ambulatory Visit (INDEPENDENT_AMBULATORY_CARE_PROVIDER_SITE_OTHER): Payer: Medicare Other | Admitting: *Deleted

## 2015-09-23 DIAGNOSIS — I480 Paroxysmal atrial fibrillation: Secondary | ICD-10-CM

## 2015-09-23 NOTE — Progress Notes (Signed)
Carelink Summary Report / Loop Recorder 

## 2015-10-02 DIAGNOSIS — B351 Tinea unguium: Secondary | ICD-10-CM | POA: Diagnosis not present

## 2015-10-02 DIAGNOSIS — D2260 Melanocytic nevi of unspecified upper limb, including shoulder: Secondary | ICD-10-CM | POA: Diagnosis not present

## 2015-10-02 DIAGNOSIS — D1801 Hemangioma of skin and subcutaneous tissue: Secondary | ICD-10-CM | POA: Diagnosis not present

## 2015-10-02 DIAGNOSIS — D2270 Melanocytic nevi of unspecified lower limb, including hip: Secondary | ICD-10-CM | POA: Diagnosis not present

## 2015-10-02 DIAGNOSIS — L821 Other seborrheic keratosis: Secondary | ICD-10-CM | POA: Diagnosis not present

## 2015-10-02 DIAGNOSIS — D225 Melanocytic nevi of trunk: Secondary | ICD-10-CM | POA: Diagnosis not present

## 2015-10-02 DIAGNOSIS — D485 Neoplasm of uncertain behavior of skin: Secondary | ICD-10-CM | POA: Diagnosis not present

## 2015-10-22 ENCOUNTER — Ambulatory Visit (INDEPENDENT_AMBULATORY_CARE_PROVIDER_SITE_OTHER): Payer: Medicare Other | Admitting: *Deleted

## 2015-10-22 DIAGNOSIS — I480 Paroxysmal atrial fibrillation: Secondary | ICD-10-CM | POA: Diagnosis not present

## 2015-10-23 ENCOUNTER — Telehealth: Payer: Self-pay | Admitting: Internal Medicine

## 2015-10-23 NOTE — Telephone Encounter (Signed)
Left message for patient that I was not aware of any pre-op needed prior and to just be at the hospital at the time he had discussed with Amber.  He can call back with any questions.

## 2015-10-23 NOTE — Telephone Encounter (Signed)
New Message  Pt requested to speak w/ RN- wanted to know if he needs to do any Pre-op at [facility] before his upcoming surgery- Please call back and discuss.

## 2015-10-23 NOTE — Progress Notes (Signed)
Carelink Summary Report / Loop Recorder 

## 2015-10-24 ENCOUNTER — Encounter: Payer: Self-pay | Admitting: Internal Medicine

## 2015-10-28 ENCOUNTER — Encounter: Payer: Self-pay | Admitting: Nurse Practitioner

## 2015-10-31 ENCOUNTER — Encounter (HOSPITAL_COMMUNITY)
Admission: RE | Disposition: A | Discharge status: Home / Self Care | Payer: Self-pay | Source: Ambulatory Visit | Attending: Internal Medicine

## 2015-10-31 ENCOUNTER — Inpatient Hospital Stay (HOSPITAL_COMMUNITY)
Admission: RE | Admit: 2015-10-31 | Discharge: 2015-11-01 | DRG: 274 | Disposition: A | Discharge status: Home / Self Care | Payer: Medicare Other | Source: Ambulatory Visit | Attending: Internal Medicine | Admitting: Internal Medicine

## 2015-10-31 ENCOUNTER — Ambulatory Visit (HOSPITAL_COMMUNITY): Payer: Medicare Other | Admitting: Anesthesiology

## 2015-10-31 ENCOUNTER — Encounter (HOSPITAL_COMMUNITY): Payer: Self-pay | Admitting: Certified Registered Nurse Anesthetist

## 2015-10-31 ENCOUNTER — Ambulatory Visit (HOSPITAL_COMMUNITY): Payer: Medicare Other

## 2015-10-31 DIAGNOSIS — E785 Hyperlipidemia, unspecified: Secondary | ICD-10-CM | POA: Diagnosis present

## 2015-10-31 DIAGNOSIS — Z006 Encounter for examination for normal comparison and control in clinical research program: Secondary | ICD-10-CM

## 2015-10-31 DIAGNOSIS — Q231 Congenital insufficiency of aortic valve: Secondary | ICD-10-CM | POA: Diagnosis not present

## 2015-10-31 DIAGNOSIS — I129 Hypertensive chronic kidney disease with stage 1 through stage 4 chronic kidney disease, or unspecified chronic kidney disease: Secondary | ICD-10-CM | POA: Diagnosis present

## 2015-10-31 DIAGNOSIS — I481 Persistent atrial fibrillation: Secondary | ICD-10-CM | POA: Diagnosis not present

## 2015-10-31 DIAGNOSIS — Z7901 Long term (current) use of anticoagulants: Secondary | ICD-10-CM | POA: Diagnosis not present

## 2015-10-31 DIAGNOSIS — I2510 Atherosclerotic heart disease of native coronary artery without angina pectoris: Secondary | ICD-10-CM | POA: Diagnosis present

## 2015-10-31 DIAGNOSIS — N183 Chronic kidney disease, stage 3 (moderate): Secondary | ICD-10-CM | POA: Diagnosis present

## 2015-10-31 DIAGNOSIS — Z79899 Other long term (current) drug therapy: Secondary | ICD-10-CM | POA: Diagnosis not present

## 2015-10-31 DIAGNOSIS — Z87891 Personal history of nicotine dependence: Secondary | ICD-10-CM | POA: Diagnosis not present

## 2015-10-31 DIAGNOSIS — K219 Gastro-esophageal reflux disease without esophagitis: Secondary | ICD-10-CM | POA: Diagnosis not present

## 2015-10-31 DIAGNOSIS — R7303 Prediabetes: Secondary | ICD-10-CM | POA: Diagnosis present

## 2015-10-31 DIAGNOSIS — I480 Paroxysmal atrial fibrillation: Secondary | ICD-10-CM | POA: Diagnosis not present

## 2015-10-31 HISTORY — PX: LEFT ATRIAL APPENDAGE OCCLUSION: SHX173A

## 2015-10-31 HISTORY — PX: OTHER SURGICAL HISTORY: SHX169

## 2015-10-31 LAB — CBC
HCT: 41.9 % (ref 39.0–52.0)
Hemoglobin: 14.2 g/dL (ref 13.0–17.0)
MCH: 31.2 pg (ref 26.0–34.0)
MCHC: 33.9 g/dL (ref 30.0–36.0)
MCV: 92.1 fL (ref 78.0–100.0)
PLATELETS: 155 10*3/uL (ref 150–400)
RBC: 4.55 MIL/uL (ref 4.22–5.81)
RDW: 12.7 % (ref 11.5–15.5)
WBC: 4.6 10*3/uL (ref 4.0–10.5)

## 2015-10-31 LAB — POCT ACTIVATED CLOTTING TIME: ACTIVATED CLOTTING TIME: 281 s

## 2015-10-31 LAB — BASIC METABOLIC PANEL
ANION GAP: 12 (ref 5–15)
BUN: 20 mg/dL (ref 6–20)
CALCIUM: 8.8 mg/dL — AB (ref 8.9–10.3)
CO2: 23 mmol/L (ref 22–32)
Chloride: 107 mmol/L (ref 101–111)
Creatinine, Ser: 1.24 mg/dL (ref 0.61–1.24)
GFR, EST NON AFRICAN AMERICAN: 58 mL/min — AB (ref >60)
GLUCOSE: 126 mg/dL — AB (ref 65–99)
POTASSIUM: 4.1 mmol/L (ref 3.5–5.1)
SODIUM: 142 mmol/L (ref 135–145)

## 2015-10-31 LAB — TYPE AND SCREEN
ABO/RH(D): A POS
ANTIBODY SCREEN: NEGATIVE

## 2015-10-31 LAB — ABO/RH: ABO/RH(D): A POS

## 2015-10-31 LAB — MRSA PCR SCREENING: MRSA BY PCR: NEGATIVE

## 2015-10-31 SURGERY — LEFT ATRIAL APPENDAGE OCCLUSION
Anesthesia: General

## 2015-10-31 MED ORDER — FENTANYL CITRATE (PF) 100 MCG/2ML IJ SOLN: 25.0000 ug | INTRAMUSCULAR | Status: DC | PRN

## 2015-10-31 MED ORDER — IOPAMIDOL (ISOVUE-370) INJECTION 76%
INTRAVENOUS | Status: AC
Start: 2015-10-31 — End: 2015-10-31
  Filled 2015-10-31: qty 100

## 2015-10-31 MED ORDER — PROTAMINE SULFATE 10 MG/ML IV SOLN
INTRAVENOUS | Status: DC | PRN
  Administered 2015-10-31: 30 mg via INTRAVENOUS

## 2015-10-31 MED ORDER — LIDOCAINE 2% (20 MG/ML) 5 ML SYRINGE
INTRAMUSCULAR | Status: DC | PRN
  Administered 2015-10-31: 80 mg via INTRAVENOUS

## 2015-10-31 MED ORDER — APIXABAN 5 MG PO TABS
5.0000 mg | ORAL_TABLET | Freq: Two times a day (BID) | ORAL | Status: DC
  Administered 2015-10-31: 5 mg via ORAL
  Filled 2015-10-31: qty 1

## 2015-10-31 MED ORDER — ONDANSETRON HCL 4 MG/2ML IJ SOLN
INTRAMUSCULAR | Status: DC | PRN
  Administered 2015-10-31: 4 mg via INTRAVENOUS

## 2015-10-31 MED ORDER — ACETAMINOPHEN 325 MG PO TABS: 650.0000 mg | ORAL_TABLET | ORAL | Status: DC | PRN

## 2015-10-31 MED ORDER — IOPAMIDOL (ISOVUE-370) INJECTION 76%
INTRAVENOUS | Status: DC | PRN
  Administered 2015-10-31: 50 mL via INTRA_ARTERIAL

## 2015-10-31 MED ORDER — SUCCINYLCHOLINE CHLORIDE 200 MG/10ML IV SOSY
PREFILLED_SYRINGE | INTRAVENOUS | Status: DC | PRN
  Administered 2015-10-31: 100 mg via INTRAVENOUS

## 2015-10-31 MED ORDER — DILTIAZEM HCL 30 MG PO TABS: 30.0000 mg | ORAL_TABLET | Freq: Four times a day (QID) | ORAL | Status: DC | PRN

## 2015-10-31 MED ORDER — PROPOFOL 10 MG/ML IV BOLUS
INTRAVENOUS | Status: DC | PRN
  Administered 2015-10-31: 150 mg via INTRAVENOUS

## 2015-10-31 MED ORDER — HEPARIN (PORCINE) IN NACL 2-0.9 UNIT/ML-% IJ SOLN
INTRAMUSCULAR | Status: DC | PRN
  Administered 2015-10-31: 11:00:00

## 2015-10-31 MED ORDER — BENAZEPRIL-HYDROCHLOROTHIAZIDE 20-12.5 MG PO TABS: 2.0000 | ORAL_TABLET | Freq: Every day | ORAL | Status: DC

## 2015-10-31 MED ORDER — HEPARIN (PORCINE) IN NACL 2-0.9 UNIT/ML-% IJ SOLN
INTRAMUSCULAR | Status: AC
  Filled 2015-10-31: qty 500

## 2015-10-31 MED ORDER — ONDANSETRON HCL 4 MG/2ML IJ SOLN: 4.0000 mg | Freq: Four times a day (QID) | INTRAMUSCULAR | Status: DC | PRN

## 2015-10-31 MED ORDER — SODIUM CHLORIDE 0.9 % IV SOLN
INTRAVENOUS | Status: DC | PRN
  Administered 2015-10-31: 10 mL/h via INTRAVENOUS

## 2015-10-31 MED ORDER — CEFAZOLIN SODIUM-DEXTROSE 2-4 GM/100ML-% IV SOLN
INTRAVENOUS | Status: AC
  Filled 2015-10-31: qty 100

## 2015-10-31 MED ORDER — SODIUM CHLORIDE 0.9 % IV SOLN: 250.0000 mL | INTRAVENOUS | Status: DC | PRN

## 2015-10-31 MED ORDER — METOPROLOL TARTRATE 5 MG/5ML IV SOLN
INTRAVENOUS | Status: AC
  Filled 2015-10-31: qty 5

## 2015-10-31 MED ORDER — FENTANYL CITRATE (PF) 250 MCG/5ML IJ SOLN
INTRAMUSCULAR | Status: DC | PRN
  Administered 2015-10-31 (×2): 100 ug via INTRAVENOUS

## 2015-10-31 MED ORDER — EPHEDRINE SULFATE 50 MG/ML IJ SOLN
INTRAMUSCULAR | Status: DC | PRN
  Administered 2015-10-31 (×2): 10 mg via INTRAVENOUS
  Administered 2015-10-31: 5 mg via INTRAVENOUS
  Administered 2015-10-31: 10 mg via INTRAVENOUS

## 2015-10-31 MED ORDER — CEFAZOLIN SODIUM-DEXTROSE 2-4 GM/100ML-% IV SOLN
2.0000 g | INTRAVENOUS | Status: AC
  Administered 2015-10-31: 2 g via INTRAVENOUS
  Filled 2015-10-31: qty 100

## 2015-10-31 MED ORDER — SUGAMMADEX SODIUM 200 MG/2ML IV SOLN
INTRAVENOUS | Status: DC | PRN
  Administered 2015-10-31: 200 mg via INTRAVENOUS

## 2015-10-31 MED ORDER — LACTATED RINGERS IV SOLN
INTRAVENOUS | Status: DC | PRN
  Administered 2015-10-31 (×2): via INTRAVENOUS

## 2015-10-31 MED ORDER — SODIUM CHLORIDE 0.9% FLUSH: 3.0000 mL | INTRAVENOUS | Status: DC | PRN

## 2015-10-31 MED ORDER — BENAZEPRIL HCL 20 MG PO TABS: 20.0000 mg | ORAL_TABLET | Freq: Every day | ORAL | Status: DC

## 2015-10-31 MED ORDER — HEPARIN SODIUM (PORCINE) 1000 UNIT/ML IJ SOLN
INTRAMUSCULAR | Status: DC | PRN
  Administered 2015-10-31: 12000 [IU] via INTRAVENOUS

## 2015-10-31 MED ORDER — LIDOCAINE HCL (PF) 1 % IJ SOLN
INTRAMUSCULAR | Status: AC
  Filled 2015-10-31: qty 30

## 2015-10-31 MED ORDER — METOPROLOL TARTRATE 5 MG/5ML IV SOLN
2.5000 mg | Freq: Once | INTRAVENOUS | Status: AC
  Administered 2015-10-31: 2.5 mg via INTRAVENOUS

## 2015-10-31 MED ORDER — HEPARIN SODIUM (PORCINE) 1000 UNIT/ML IJ SOLN
INTRAMUSCULAR | Status: AC
  Filled 2015-10-31: qty 1

## 2015-10-31 MED ORDER — PHENYLEPHRINE HCL 10 MG/ML IJ SOLN
10.0000 mg | INTRAVENOUS | Status: DC | PRN
  Administered 2015-10-31: 20 ug/min via INTRAVENOUS

## 2015-10-31 MED ORDER — HYDROCHLOROTHIAZIDE 12.5 MG PO CAPS
12.5000 mg | ORAL_CAPSULE | Freq: Every day | ORAL | Status: DC
  Filled 2015-10-31: qty 1

## 2015-10-31 MED ORDER — SODIUM CHLORIDE 0.45 % IV SOLN
INTRAVENOUS | Status: DC
Start: 2015-10-31 — End: 2015-10-31

## 2015-10-31 MED ORDER — SODIUM CHLORIDE 0.9% FLUSH
3.0000 mL | Freq: Two times a day (BID) | INTRAVENOUS | Status: DC
  Administered 2015-10-31 (×2): 3 mL via INTRAVENOUS

## 2015-10-31 MED ORDER — ROCURONIUM BROMIDE 100 MG/10ML IV SOLN
INTRAVENOUS | Status: DC | PRN
  Administered 2015-10-31: 50 mg via INTRAVENOUS

## 2015-10-31 SURGICAL SUPPLY — 13 items
BAG SNAP BAND KOVER 36X36 (MISCELLANEOUS) ×2 IMPLANT
CATH DIAG 6FR PIGTAIL ANGLED (CATHETERS) ×2 IMPLANT
KIT HEART LEFT (KITS) ×2 IMPLANT
PACK CARDIAC CATHETERIZATION (CUSTOM PROCEDURE TRAY) ×2 IMPLANT
PAD DEFIB LIFELINK (PAD) ×2 IMPLANT
SHEATH INTRO CHECKFLO 16F 13 (SHEATH) ×1 IMPLANT
SHEATH INTRO CHECKFLO 16F 13CM (SHEATH) ×1
SHEATH PINNACLE 8F 10CM (SHEATH) ×2 IMPLANT
TRANSDUCER W/STOPCOCK (MISCELLANEOUS) ×2 IMPLANT
TUBING CIL FLEX 10 FLL-RA (TUBING) ×2 IMPLANT
WATCHMAN ACCESS DOUBLE CURVE (SHEATH) ×2 IMPLANT
WATCHMAN CLOSURE 27MM (Prosthesis & Implant Heart) ×2 IMPLANT
WIRE AMPLATZ WHISKJ .035X260CM (WIRE) ×2 IMPLANT

## 2015-10-31 NOTE — Progress Notes (Signed)
Echocardiogram 2D Echocardiogram has been performed.  Joelene Millin 10/31/2015, 12:50 PM

## 2015-10-31 NOTE — H&P (Signed)
Chief Complaint  Patient presents with  . Atrial Fibrillation    History of Present Illness: Samuel Willis is a 70 y.o. male who presents today for left atrial appendage occlusive device placement. He has done well since I saw him last. Remains active. He stable SOB. Rare palpitations.  Today, he denies symptoms of chest pain, orthopnea, PND, lower extremity edema, claudication, dizziness, presyncope, syncope, bleeding, or neurologic sequela. The patient is tolerating medications without difficulties and is otherwise without complaint today.    Past Medical History  Diagnosis Date  . GERD (gastroesophageal reflux disease)   . Hyperlipidemia   . Hypertension   . Nephrolithiasis   . Paroxysmal atrial fibrillation (HCC)     declines anticoagulation, chads2vasc score is at least 3  . Heart valve problem     aortic stenosis, aortic regurgitation (bicuspid aortic valve)  . CAD (coronary artery disease)     LAD dz but no corresponding ischemia on myoview  . Aortic aneurysm, thoracic (HCC)     bicuspid aortic valve. aortic stenosis,  . H/O hiatal hernia   . Wears glasses   . Fatty liver 04/2012; 06/2013    Noted on noncontrast abd CT done during trauma w/u when tractor rolled onto his knee. Also noted on noncontrast chest CT done to screen for asbestos lung damage.  . Asbestos exposure CT 06/2013    Noncalcified pleural plaques bilat; no signs of malignancy.  . Chronic renal insufficiency, stage III (moderate) 06/2013    CrCl about 50 ml/min  . Prediabetes 2015    A1c 6.4%   Past Surgical History  Procedure Laterality Date  . Esophagogastroduodenoscopy  02/23/2011    Barrett's esophagus, hiatal hernia - dilated for dysphagia  . Colonoscopy    . Tendon repair  11/06/2011    Procedure: TENDON REPAIR; Surgeon: Cammie Sickle., MD; Location: [facility]; Service:  Orthopedics; Laterality: Left; explore/repair tendons left hand   . Cardiac catheterization  06/24/10  . Circumcision  01/18/2012    Procedure: CIRCUMCISION ADULT; Surgeon: [name], MD; Location: [facility]; Service: Urology; Laterality: N/A; 30 mins requested for this case   . Shoulder surgery      Arthroscopic 02/23/12  . Implantable loop recorder placement  06/29/14    MDT LINQ implanted by Dr [name] in the office as part of the RIO II protocol     Current Outpatient Prescriptions  Medication Sig Dispense Refill  . apixaban (ELIQUIS) 5 MG TABS tablet Take 1 tablet (5 mg total) by mouth 2 (two) times daily. 60 tablet 2  . arginine 500 MG tablet Take 500 mg by mouth daily. Cardio for Life supplement    . atorvastatin (LIPITOR) 40 MG tablet TAKE ONE TABLET BY MOUTH ONCE DAILY 90 tablet 3  . benazepril-hydrochlorthiazide (LOTENSIN HCT) 20-12.5 MG tablet TAKE TWO TABLETS BY MOUTH ONCE DAILY 180 tablet 0  . metoprolol tartrate (LOPRESSOR) 25 MG tablet Take 25 mg by mouth daily as needed (PALPITATIONS).    Marland Kitchen omeprazole (PRILOSEC) 40 MG capsule Take 1 capsule (40 mg total) by mouth daily. 90 capsule 3   No current facility-administered medications for this visit.    Allergies: Review of patient's allergies indicates no known allergies.   Social History: The patient  reports that he quit smoking about 21 years ago. He has never used smokeless tobacco. He reports that he does not drink alcohol or use illicit drugs.   Family History: The patient's family history includes Arthritis in  his mother; Asthma in his father; Diabetes in his mother; Emphysema in his father; Heart attack (age of onset: 70) in his father; Heart disease in his father; Stroke in his mother. There is no history of Hyperlipidemia or Hypertension.    ROS: Please see the history of present illness. All other systems are reviewed  and negative.    PHYSICAL EXAM: Filed Vitals:   10/31/15 0801  BP: 137/86  Pulse: 59  Temp: 97.9 F (36.6 C)  Resp: 18   GEN: Well nourished, well developed, in no acute distress  HEENT: normal  Neck: no JVD, carotid bruits, or masses Cardiac: RRR; + 2/6 diastolic murmur LUSB, 2/6 SEM LUSB, no rubs, or gallops,no edema  Respiratory: clear to auscultation bilaterally, normal work of breathing GI: soft, nontender, nondistended, + BS MS: no deformity or atrophy  Skin: warm and dry, ILR site is well healed Neuro: Strength and sensation are intact Psych: euthymic mood, full affect  Device interrogation is reviewed today in detail. See PaceArt for details.   Recent Labs: 08/06/2014: ALT 29; BUN 22; Creatinine, Ser 1.17; Potassium 4.5; Sodium 141; TSH 1.90    Lipid Panel   Labs (Brief)       Component Value Date/Time   CHOL 137 08/06/2014 1413   TRIG 63.0 08/06/2014 1413   HDL 41.90 08/06/2014 1413   CHOLHDL 3 08/06/2014 1413   VLDL 12.6 08/06/2014 1413   LDLCALC 83 08/06/2014 1413       Wt Readings from Last 3 Encounters:  07/22/15 212 lb 9.6 oz (96.435 kg)  10/22/14 210 lb (95.255 kg)  10/03/14 210 lb (95.255 kg)     ASSESSMENT AND PLAN:  1. Atrial fibrillation This patients CHA2DS2-VASc Score and unadjusted Ischemic Stroke Rate (% per year) is equal to 3.2 % stroke rate/year from a score of 3  We had a long discussion today about risks of recurrent stroke and benefits of anticoagulation. 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 He does not feel that he can take anticoagulation long term. 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 The patients would be a candidate for short term oral anticoagulation. Procedural risks  for the Watchman implant have been reviewed with the patient including a 1% risk of stroke, 2% risk of perforation, 0.1% risk of device embolization. Given the patient's poor candidacy for long-term oral anticoagulation, ability to tolerate short term oral anticoagulation, I have recommended the watchman left atrial appendage closure system. I have spoken with Dr [name] who agrees. We will proceed at this time.    2. Aortic valve disease Mild by TEE 2/17  [name] MD, [facility] 10/31/2015 9:27 AM

## 2015-10-31 NOTE — Anesthesia Preprocedure Evaluation (Signed)
Anesthesia Evaluation  Patient identified by MRN, date of birth, ID band Patient awake    Reviewed: Allergy & Precautions, NPO status , Patient's Chart, lab work & pertinent test results  Airway Mallampati: II  TM Distance: >3 FB Neck ROM: Full    Dental no notable dental hx.    Pulmonary neg pulmonary ROS, former smoker,    Pulmonary exam normal breath sounds clear to auscultation       Cardiovascular hypertension, Pt. on medications + CAD and + Peripheral Vascular Disease  + dysrhythmias Atrial Fibrillation + Valvular Problems/Murmurs AS  Rhythm:Regular Rate:Normal + Systolic murmurs  Left ventricle: The cavity size was normal. Wall thickness was increased in a pattern of mild LVH. Systolic function was normal. The estimated ejection fraction was in the range of 60% to 65%. Wall motion was normal; there were no regional wall motion abnormalities. - Aortic valve: Functionally bicuspid aortic valve with fused right and noncoronary cusps. Mild aortic insufficiency and mild aortic stenosis with mean gradient 10 mmHg. - Aorta: The ascending aorta was mildly dilated to 4.0 cm. The descending thoracic aorta had grade III plaque.    Neuro/Psych negative neurological ROS  negative psych ROS   GI/Hepatic Neg liver ROS, GERD  Medicated,  Endo/Other  negative endocrine ROS  Renal/GU negative Renal ROS  negative genitourinary   Musculoskeletal negative musculoskeletal ROS (+)   Abdominal   Peds negative pediatric ROS (+)  Hematology negative hematology ROS (+)   Anesthesia Other Findings   Reproductive/Obstetrics negative OB ROS                             Anesthesia Physical Anesthesia Plan  ASA: III  Anesthesia Plan: General   Post-op Pain Management:    Induction: Intravenous  Airway Management Planned: Oral ETT  Additional Equipment: Arterial line  Intra-op Plan:    Post-operative Plan: Extubation in OR  Informed Consent: I have reviewed the patients History and Physical, chart, labs and discussed the procedure including the risks, benefits and alternatives for the proposed anesthesia with the patient or authorized representative who has indicated his/her understanding and acceptance.   Dental advisory given  Plan Discussed with: CRNA and Surgeon  Anesthesia Plan Comments:         Anesthesia Quick Evaluation

## 2015-10-31 NOTE — Progress Notes (Signed)
Left radial arterial line removed by Nathanial Rancher, manual pressure held for 10 minutes. Small tegaderm dressing applied. Lt radial pulse 2+.

## 2015-10-31 NOTE — CV Procedure (Signed)
TEE was performed intraprocedurally to assist the the placement of a Watchman LAA occluder device. This involves 2D, 3D and color doppler images, and quantitative measurements. Please see the full TEE report for details.  Pixie Casino, MD, [facility] Attending Cardiologist [place]

## 2015-10-31 NOTE — Progress Notes (Addendum)
Patient called RN to bedside, said he had gone back into afib.  EKG obtained and confirmed afib, HR 140's.  BP and oxygen sats stable, patient denies CP/SOB but did notice when he went into atrial fibrillation. Cards paged.  Order received for 2.5 mg IV Lopressor. Will continue to monitor. Yancey, Ardeth Sportsman

## 2015-10-31 NOTE — Progress Notes (Signed)
Venous sheath removed and pressure held for 30 minutes . No apparent complications, groin level zero, and patient understands post instructions. Samuel Willis RTR

## 2015-10-31 NOTE — Anesthesia Postprocedure Evaluation (Signed)
Anesthesia Post Note  Patient: ANZAR BARNETT  Procedure(s) Performed: Procedure(s) (LRB): LEFT ATRIAL APPENDAGE OCCLUSION (N/A)  Patient location during evaluation: PACU Anesthesia Type: General Level of consciousness: awake and alert Pain management: pain level controlled Vital Signs Assessment: post-procedure vital signs reviewed and stable Respiratory status: spontaneous breathing, nonlabored ventilation, respiratory function stable and patient connected to nasal cannula oxygen Cardiovascular status: blood pressure returned to baseline and stable Postop Assessment: no signs of nausea or vomiting Anesthetic complications: no    Last Vitals:  Filed Vitals:   10/31/15 1150 10/31/15 1155  BP: 130/54 131/56  Pulse: 65 68  Temp:    Resp: 13 11    Last Pain: There were no vitals filed for this visit.               Shatori Bertucci S

## 2015-10-31 NOTE — Anesthesia Procedure Notes (Signed)
Procedure Name: Intubation Date/Time: 10/31/2015 10:12 AM Performed by: Layla Maw Pre-anesthesia Checklist: Patient identified, Emergency Drugs available, Suction available and Patient being monitored Patient Re-evaluated:Patient Re-evaluated prior to inductionOxygen Delivery Method: Circle System Utilized Preoxygenation: Pre-oxygenation with 100% oxygen Intubation Type: IV induction Ventilation: Mask ventilation with difficulty and Oral airway inserted - appropriate to patient size Laryngoscope Size: Mac and 4 Grade View: Grade I Tube type: Oral Tube size: 8.0 mm Number of attempts: 1 Airway Equipment and Method: Stylet Placement Confirmation: ETT inserted through vocal cords under direct vision,  positive ETCO2 and breath sounds checked- equal and bilateral Secured at: 22 cm Tube secured with: Tape Dental Injury: Teeth and Oropharynx as per pre-operative assessment

## 2015-10-31 NOTE — Discharge Summary (Signed)
ELECTROPHYSIOLOGY PROCEDURE DISCHARGE SUMMARY    Patient ID: Samuel Willis,  MRN: [id number], DOB/AGE: July 10, 1945 70 y.o.  Admit date: 10/31/2015 Discharge date: 11/01/2015  Primary Care Physician: Tammi Sou, MD Primary Cardiologist: [name] Electrophysiologist: [name], MD  Primary Discharge Diagnosis:  Paroxysmal atrial fibrillation status post LAA occluder insertion this admission  Secondary Discharge Diagnosis:  1.  Hypertension 2.  Hyperlipidemia 3.  Bicuspid aortic valve   Procedures This Admission:  1.  Insertion of left atrial appendage occluder (Watchman) on 10/31/15 by Dr [name] and Dr Burt Knack.  This study demonstrated successful implantation of Watchman LAA occluder with no early apparent complications. TEE at time of procedure demonstrated no leak around device.   Brief HPI: Samuel Willis is a 70 y.o. male with a history of paroxysmal atrial fibrillation.  They are felt to not be a candidate for long term Warfarin due to lifestyle risks with using farm machinary. Risks, benefits, and alternatives to Watchman implant were reviewed with the patient who wished to proceed.  The patient underwent TEE prior to the procedure which demonstrated appendage suitable for attempt at [facility] placement.    Hospital Course:  The patient was admitted and underwent Watchman insertion with details as outlined above.  They were monitored on telemetry overnight which demonstrated SR with run of AF with RVR.  Groin was without complication on the day of discharge.  The patient was examined and considered to be stable for discharge.  Bedside echo demonstrated stable device position without pericardial effusion.  Wound care and restrictions were reviewed with the patient.   This patients CHA2DS2-VASc Score and unadjusted Ischemic Stroke Rate (% per year) is equal to 3.2 % stroke rate/year from a score of 3 Above score calculated as 1 point each if present [CHF, HTN, DM,  Vascular=MI/PAD/Aortic Plaque, Age if 65-74, or Male] Above score calculated as 2 points each if present [Age > 75, or Stroke/TIA/TE]  The patient will be resumed on Eliquis at discharge.  The patient will be seen by APP in 1 week for groin check, monitor for s/s of bleeding, CBC.  45 day TEE will also be scheduled.   Physical Exam: Filed Vitals:   10/31/15 2001 11/01/15 0013 11/01/15 0343 11/01/15 0711  BP:   112/70 133/76  Pulse:  58 57 58  Temp: 97.6 F (36.4 C) 97.5 F (36.4 C) 97.6 F (36.4 C) 99 F (37.2 C)  TempSrc: Oral Oral Oral Oral  Resp:   12 14  Height:      Weight:      SpO2:  96% 98% 98%    GEN- The patient is well appearing, alert and oriented x 3 today.   HEENT: normocephalic, atraumatic; sclera clear, conjunctiva pink; hearing intact; oropharynx clear; neck supple Lungs- Clear to ausculation bilaterally, normal work of breathing.  No wheezes, rales, rhonchi Heart- Regular rate and rhythm  GI- soft, non-tender, non-distended, bowel sounds present Extremities- no clubbing, cyanosis, or edema; DP/PT/radial pulses 2+ bilaterally, groin without hematoma/bruit MS- no significant deformity or atrophy Skin- warm and dry, no rash or lesion Psych- euthymic mood, full affect Neuro- strength and sensation are intact   Labs:   Lab Results  Component Value Date   WBC 4.6 10/31/2015   HGB 14.2 10/31/2015   HCT 41.9 10/31/2015   MCV 92.1 10/31/2015   PLT 155 10/31/2015     Recent Labs Lab 11/01/15 0338  NA 144  K 4.1  CL 105  CO2 29  BUN  15  CREATININE 1.21  CALCIUM 9.1  GLUCOSE 110*     Discharge Medications:    Medication List    TAKE these medications        apixaban 5 MG Tabs tablet  Commonly known as:  ELIQUIS  Take 1 tablet (5 mg total) by mouth 2 (two) times daily.     arginine 500 MG tablet  Take 500 mg by mouth daily. Cardio for Life supplement     aspirin EC 81 MG tablet  Take 1 tablet (81 mg total) by mouth daily.      atorvastatin 40 MG tablet  Commonly known as:  LIPITOR  TAKE ONE TABLET BY MOUTH ONCE DAILY     benazepril-hydrochlorthiazide 20-12.5 MG tablet  Commonly known as:  LOTENSIN HCT  Take 2 tablets by mouth daily.     diltiazem 30 MG tablet  Commonly known as:  CARDIZEM  Take 1 tablet (30 mg total) by mouth every 6 (six) hours as needed (heart rate > 110 bpm).     metoprolol tartrate 25 MG tablet  Commonly known as:  LOPRESSOR  Take 25 mg by mouth daily as needed (PALPITATIONS).     omeprazole 40 MG capsule  Commonly known as:  PRILOSEC  Take 1 capsule (40 mg total) by mouth daily.        Disposition:  Discharge Instructions    Diet - low sodium heart healthy    Complete by:  As directed      Increase activity slowly    Complete by:  As directed           Follow-up Information    Follow up with [name], MD On 11/06/2015.   Specialty:  Cardiology   Why:  at [facility] information:   [place] [address] [phone]       Duration of Discharge Encounter: Greater than 30 minutes including physician time.  Signed, Chanetta Marshall, NP 11/01/2015 8:15 AM   I have seen, examined the patient, and reviewed the above assessment and plan.   On exam, RRR.   Limited echo by me this am is without effusion.  Changes to above are made where necessary.    Co Sign: [name], MD 11/01/2015 8:22 AM

## 2015-10-31 NOTE — Transfer of Care (Signed)
Immediate Anesthesia Transfer of Care Note  Patient: Samuel Willis  Procedure(s) Performed: Procedure(s): LEFT ATRIAL APPENDAGE OCCLUSION (N/A)  Patient Location: PACU  Anesthesia Type:General  Level of Consciousness: awake, alert , oriented and patient cooperative  Airway & Oxygen Therapy: Patient Spontanous Breathing and Patient connected to nasal cannula oxygen  Post-op Assessment: Report given to RN and Post -op Vital signs reviewed and stable  Post vital signs: Reviewed and stable  Last Vitals:  Filed Vitals:   10/31/15 1139 10/31/15 1144  BP:    Pulse: 0 0  Temp:    Resp: 0 0    Last Pain: There were no vitals filed for this visit.       Complications: No apparent anesthesia complications

## 2015-11-01 ENCOUNTER — Encounter (HOSPITAL_COMMUNITY): Payer: Self-pay | Admitting: Family Medicine

## 2015-11-01 DIAGNOSIS — I480 Paroxysmal atrial fibrillation: Principal | ICD-10-CM

## 2015-11-01 LAB — BASIC METABOLIC PANEL
ANION GAP: 10 (ref 5–15)
BUN: 15 mg/dL (ref 6–20)
CO2: 29 mmol/L (ref 22–32)
Calcium: 9.1 mg/dL (ref 8.9–10.3)
Chloride: 105 mmol/L (ref 101–111)
Creatinine, Ser: 1.21 mg/dL (ref 0.61–1.24)
GFR calc Af Amer: >60 mL/min (ref >60)
GFR calc non Af Amer: 59 mL/min — ABNORMAL LOW (ref >60)
GLUCOSE: 110 mg/dL — AB (ref 65–99)
POTASSIUM: 4.1 mmol/L (ref 3.5–5.1)
Sodium: 144 mmol/L (ref 135–145)

## 2015-11-01 MED ORDER — ASPIRIN EC 81 MG PO TBEC: 81.0000 mg | DELAYED_RELEASE_TABLET | Freq: Every day | ORAL | Status: DC

## 2015-11-01 MED ORDER — DILTIAZEM HCL 30 MG PO TABS: 30.0000 mg | ORAL_TABLET | Freq: Four times a day (QID) | ORAL | Status: DC | PRN

## 2015-11-01 NOTE — Discharge Instructions (Signed)
°  No driving for 4 days. No lifting over 5 lbs for 1 week. No sexual activity for 1 week.  Keep procedure site clean & dry. If you notice increased pain, swelling, bleeding or pus, call/return!  You may shower, but no soaking baths/hot tubs/pools for 1 week.  ° ° °

## 2015-11-01 NOTE — Progress Notes (Signed)
PIV removed.  AVS, follow-up care instructions, medications, activity & diet instructions reviewed with patient & wife.  Patient discharged to home ambulatory.

## 2015-11-06 ENCOUNTER — Encounter: Payer: Self-pay | Admitting: *Deleted

## 2015-11-06 ENCOUNTER — Encounter: Payer: Self-pay | Admitting: Nurse Practitioner

## 2015-11-06 ENCOUNTER — Ambulatory Visit (INDEPENDENT_AMBULATORY_CARE_PROVIDER_SITE_OTHER): Payer: Medicare Other | Admitting: Internal Medicine

## 2015-11-06 VITALS — BP 130/86 | HR 78 | Ht 71.0 in | Wt 206.0 lb

## 2015-11-06 DIAGNOSIS — Q231 Congenital insufficiency of aortic valve: Secondary | ICD-10-CM

## 2015-11-06 DIAGNOSIS — I480 Paroxysmal atrial fibrillation: Secondary | ICD-10-CM

## 2015-11-06 DIAGNOSIS — I10 Essential (primary) hypertension: Secondary | ICD-10-CM | POA: Diagnosis not present

## 2015-11-06 LAB — CUP PACEART REMOTE DEVICE CHECK: MDC IDC SESS DTM: 20170310220811

## 2015-11-06 NOTE — Progress Notes (Signed)
Electrophysiology Office Note Date: 11/06/2015  ID:  Samuel Willis, Samuel Willis [birth year], MRN [id number]  PCP: Tammi Sou, MD Primary Cardiologist: [name] Electrophysiologist: [name]  CC: Watchman follow up  Samuel Willis is a 70 y.o. male seen today for Dr [name].  He is s/p Watchman implant 10/31/15. Since discharge, the patient reports doing very well.  He denies procedure related complications.  He denies chest pain, palpitations, dyspnea, PND, orthopnea, nausea, vomiting, dizziness, syncope, edema, weight gain, or early satiety.  Past Medical History  Diagnosis Date  . GERD (gastroesophageal reflux disease)   . Hyperlipidemia   . Hypertension   . Nephrolithiasis   . Paroxysmal atrial fibrillation (HCC)     Eliquis; chads2vasc score is at least 3; electrophys did placement of Watchman left atrial appendage occlusive device 10/2015.  . Bicuspid aortic valve     aortic stenosis, aortic regurgitation (bicuspid aortic valve)  . CAD (coronary artery disease)     LAD dz but no corresponding ischemia on myoview  . Aortic aneurysm, thoracic (HCC)     bicuspid aortic valve. aortic stenosis, aortic regurg  . H/O hiatal hernia   . Wears glasses   . Fatty liver 04/2012; 06/2013    Noted on noncontrast abd CT done during trauma w/u when tractor rolled onto his knee.  Also noted on noncontrast chest CT done to screen for asbestos lung damage.  . Asbestos exposure CT 06/2013    Noncalcified pleural plaques bilat; no signs of malignancy.  . Chronic renal insufficiency, stage III (moderate) 06/2013    CrCl about 50 ml/min  . Prediabetes 2015    A1c 6.4%.  A1c 6.1% 08/2015   Past Surgical History  Procedure Laterality Date  . Esophagogastroduodenoscopy  02/23/2011    Barrett's esophagus, hiatal hernia - dilated for dysphagia  . Colonoscopy    . Tendon repair  11/06/2011    Procedure: TENDON REPAIR;  Surgeon: Cammie Sickle., MD;  Location: [facility];  Service:  Orthopedics;  Laterality: Left;  explore/repair tendons left hand   . Cardiac catheterization  06/24/10  . Circumcision  01/18/2012    Procedure: CIRCUMCISION ADULT;  Surgeon: [name], MD;  Location: [facility];  Service: Urology;  Laterality: N/A;  30 mins requested for this case   . Shoulder surgery      Arthroscopic 02/23/12  . Implantable loop recorder placement  06/29/14    MDT LINQ implanted by Dr [name] in the office as part of the RIO II protocol. + A fib confirmation.  Darden Dates without cardioversion N/A 07/25/2015    Dr. [name]: EF 60-65%, normal LV wall motion, functionally bicuspid aortic valve with mild AI and mild AS, ascending aorta dilated to 4 cm, left atrial appendage measured for Watchman procedure/device  . Watchman placement  10/31/15    Left atrial appendage occlusive device   . Electrophysiologic study N/A 10/31/2015    Procedure: LEFT ATRIAL APPENDAGE OCCLUSION;  Surgeon: [name], MD;  Location: [facility];  Service: Cardiovascular;  Laterality: N/A;    Current Outpatient Prescriptions  Medication Sig Dispense Refill  . apixaban (ELIQUIS) 5 MG TABS tablet Take 1 tablet (5 mg total) by mouth 2 (two) times daily. 180 tablet 3  . arginine 500 MG tablet Take 500 mg by mouth daily. Cardio for Life supplement    . aspirin EC 81 MG tablet Take 1 tablet (81 mg total) by mouth daily.    Marland Kitchen atorvastatin (  LIPITOR) 40 MG tablet TAKE ONE TABLET BY MOUTH ONCE DAILY 90 tablet 3  . benazepril-hydrochlorthiazide (LOTENSIN HCT) 20-12.5 MG tablet Take 2 tablets by mouth daily. 180 tablet 3  . diltiazem (CARDIZEM) 30 MG tablet Take 1 tablet (30 mg total) by mouth every 6 (six) hours as needed (heart rate > 110 bpm). 30 tablet 0  . metoprolol tartrate (LOPRESSOR) 25 MG tablet Take 25 mg by mouth daily as needed (PALPITATIONS).    Marland Kitchen omeprazole (PRILOSEC) 40 MG capsule Take 1 capsule (40 mg total) by mouth daily. 90 capsule 3   No current facility-administered  medications for this visit.    Allergies:   Review of patient's allergies indicates no known allergies.   Social History: Social History   Social History  . Marital Status: Married    Spouse Name: N/A  . Number of Children: 2  . Years of Education: N/A   Occupational History  . Self employed    Social History Main Topics  . Smoking status: Former Smoker    Quit date: 11/03/1993  . Smokeless tobacco: Never Used  . Alcohol Use: No  . Drug Use: No  . Sexual Activity: Not on file   Other Topics Concern  . Not on file   Social History Narrative   Married, 2 daughters.   Orig from [place].   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   No T/A/Ds.              Family History: Family History  Problem Relation Age of Onset  . Arthritis Mother   . Diabetes Mother   . Stroke Mother     In her 76s  . Heart attack Father 7  . Asthma Father   . Emphysema Father   . Heart disease Father   . Hyperlipidemia Neg Hx   . Hypertension Neg Hx     Review of Systems: All other systems reviewed and are otherwise negative except as noted above.   Physical Exam: VS:  BP 130/86 mmHg  Pulse 78  Ht 5\' 11"  (1.803 m)  Wt 206 lb (93.441 kg)  BMI 28.74 kg/m2 , BMI Body mass index is 28.74 kg/(m^2). Wt Readings from Last 3 Encounters:  11/06/15 206 lb (93.441 kg)  10/31/15 197 lb (89.359 kg)  08/26/15 201 lb (91.173 kg)    GEN- The patient is well appearing, alert and oriented x 3 today.   HEENT: normocephalic, atraumatic; sclera clear, conjunctiva pink; hearing intact; oropharynx clear; neck supple Lungs- Clear to ausculation bilaterally, normal work of breathing.  No wheezes, rales, rhonchi Heart- Regular rate and rhythm GI- soft, non-tender, non-distended, bowel sounds present Extremities- no clubbing, cyanosis, or edema; DP/PT/radial pulses 2+ bilaterally MS- no significant deformity or atrophy Skin- warm and dry, no  rash or lesion  Psych- euthymic mood, full affect Neuro- strength and sensation are intact   EKG:  EKG is not ordered today.  Recent Labs: 08/23/2015: ALT 24; ALT 24; TSH 1.58 10/31/2015: Hemoglobin 14.2; Platelets 155 11/01/2015: BUN 15; Creatinine, Ser 1.21; Potassium 4.1; Sodium 144    Other studies Reviewed: Additional studies/ records that were reviewed today include: hospital records   Assessment and Plan: 1.  Paroxysmal atrial fibrillation Doing well s/p watchman Continue Eliquis and ASA 81mg  daily TEE 6 weeks post procedure to evaluate for device seating and leaks CHADS2VASC is 3 No bleeding issues   2. HTN Stable No change required today  3.  Bicuspid aortic valve Followed by Dr [name]    Current medicines are reviewed at length with the patient today.   The patient does not have concerns regarding his medicines.  The following changes were made today:  none  Labs/ tests ordered today include: TEE 6 weeks post Watchman    Disposition:   Follow up following 6 week TEE     Signed, [name], MD   11/06/2015 4:00 PM   [place] [address] [phone] (office) [phone] (fax)

## 2015-11-06 NOTE — Patient Instructions (Signed)
Medication Instructions: - Your physician recommends that you continue on your current medications as directed. Please refer to the Current Medication list given to you today.  Labwork: - none  Procedures/Testing: - Your physician has requested that you have a TEE. During a TEE, sound waves are used to create images of your heart. It provides your doctor with information about the size and shape of your heart and how well your heart's chambers and valves are working. In this test, a transducer is attached to the end of a flexible tube that's guided down your throat and into your esophagus (the tube leading from you mouth to your stomach) to get a more detailed image of your heart. You are not awake for the procedure. Please see the instruction sheet given to you today. For further information please visit [email or web address].  Follow-Up: - Your physician recommends that you schedule a follow-up appointment in: 26 weeks with Amber Seiler,NP or Dr. [name].   Any Additional Special Instructions Will Be Listed Below (If Applicable).     If you need a refill on your cardiac medications before your next appointment, please call your pharmacy.

## 2015-11-10 LAB — CUP PACEART REMOTE DEVICE CHECK: Date Time Interrogation Session: 20170409223725

## 2015-11-10 NOTE — Progress Notes (Signed)
Carelink summary report received. Battery status OK. Normal device function. No new symptom episodes, tachy episodes, brady, or pause episodes. No new AF episodes. Monthly summary reports and ROV/PRN 

## 2015-11-21 ENCOUNTER — Ambulatory Visit (INDEPENDENT_AMBULATORY_CARE_PROVIDER_SITE_OTHER): Payer: Medicare Other | Admitting: *Deleted

## 2015-11-21 DIAGNOSIS — I480 Paroxysmal atrial fibrillation: Secondary | ICD-10-CM | POA: Diagnosis not present

## 2015-11-22 NOTE — Progress Notes (Signed)
Carelink Summary Report / Loop Recorder 

## 2015-11-29 LAB — CUP PACEART REMOTE DEVICE CHECK
Date Time Interrogation Session: 20170509223739
Date Time Interrogation Session: 20170608230938

## 2015-12-06 ENCOUNTER — Encounter (HOSPITAL_COMMUNITY): Payer: Self-pay | Admitting: Internal Medicine

## 2015-12-23 ENCOUNTER — Ambulatory Visit (INDEPENDENT_AMBULATORY_CARE_PROVIDER_SITE_OTHER): Payer: Medicare Other | Admitting: *Deleted

## 2015-12-23 DIAGNOSIS — I480 Paroxysmal atrial fibrillation: Secondary | ICD-10-CM

## 2015-12-23 NOTE — Progress Notes (Signed)
Carelink Summary Report / Loop Recorder 

## 2015-12-26 ENCOUNTER — Other Ambulatory Visit: Payer: Self-pay | Admitting: Nurse Practitioner

## 2015-12-27 ENCOUNTER — Encounter (HOSPITAL_COMMUNITY): Payer: Self-pay

## 2015-12-27 ENCOUNTER — Ambulatory Visit (HOSPITAL_COMMUNITY)
Admission: RE | Admit: 2015-12-27 | Discharge: 2015-12-27 | Disposition: A | Discharge status: Home / Self Care | Payer: Medicare Other | Source: Ambulatory Visit | Attending: Cardiology | Admitting: Cardiology

## 2015-12-27 ENCOUNTER — Encounter (HOSPITAL_COMMUNITY)
Admission: RE | Disposition: A | Discharge status: Home / Self Care | Payer: Self-pay | Source: Ambulatory Visit | Attending: Cardiology

## 2015-12-27 ENCOUNTER — Ambulatory Visit (HOSPITAL_BASED_OUTPATIENT_CLINIC_OR_DEPARTMENT_OTHER)
Admission: RE | Admit: 2015-12-27 | Discharge: 2015-12-27 | Disposition: A | Discharge status: Home / Self Care | Payer: Medicare Other | Source: Ambulatory Visit | Attending: Nurse Practitioner | Admitting: Nurse Practitioner

## 2015-12-27 DIAGNOSIS — I129 Hypertensive chronic kidney disease with stage 1 through stage 4 chronic kidney disease, or unspecified chronic kidney disease: Secondary | ICD-10-CM | POA: Diagnosis not present

## 2015-12-27 DIAGNOSIS — I350 Nonrheumatic aortic (valve) stenosis: Secondary | ICD-10-CM | POA: Diagnosis not present

## 2015-12-27 DIAGNOSIS — I2510 Atherosclerotic heart disease of native coronary artery without angina pectoris: Secondary | ICD-10-CM | POA: Diagnosis not present

## 2015-12-27 DIAGNOSIS — I7121 Aneurysm of the ascending aorta, without rupture: Secondary | ICD-10-CM | POA: Insufficient documentation

## 2015-12-27 DIAGNOSIS — I712 Thoracic aortic aneurysm, without rupture: Secondary | ICD-10-CM | POA: Insufficient documentation

## 2015-12-27 DIAGNOSIS — Q231 Congenital insufficiency of aortic valve: Secondary | ICD-10-CM | POA: Insufficient documentation

## 2015-12-27 DIAGNOSIS — I480 Paroxysmal atrial fibrillation: Secondary | ICD-10-CM | POA: Diagnosis not present

## 2015-12-27 DIAGNOSIS — I351 Nonrheumatic aortic (valve) insufficiency: Secondary | ICD-10-CM | POA: Insufficient documentation

## 2015-12-27 DIAGNOSIS — E785 Hyperlipidemia, unspecified: Secondary | ICD-10-CM | POA: Diagnosis not present

## 2015-12-27 DIAGNOSIS — Z77090 Contact with and (suspected) exposure to asbestos: Secondary | ICD-10-CM | POA: Diagnosis not present

## 2015-12-27 DIAGNOSIS — Z09 Encounter for follow-up examination after completed treatment for conditions other than malignant neoplasm: Secondary | ICD-10-CM | POA: Diagnosis not present

## 2015-12-27 DIAGNOSIS — Z7982 Long term (current) use of aspirin: Secondary | ICD-10-CM | POA: Insufficient documentation

## 2015-12-27 DIAGNOSIS — Z7901 Long term (current) use of anticoagulants: Secondary | ICD-10-CM | POA: Insufficient documentation

## 2015-12-27 DIAGNOSIS — Z87891 Personal history of nicotine dependence: Secondary | ICD-10-CM | POA: Diagnosis not present

## 2015-12-27 DIAGNOSIS — N183 Chronic kidney disease, stage 3 (moderate): Secondary | ICD-10-CM | POA: Insufficient documentation

## 2015-12-27 DIAGNOSIS — Q2381 Bicuspid aortic valve: Secondary | ICD-10-CM | POA: Insufficient documentation

## 2015-12-27 HISTORY — PX: TEE WITHOUT CARDIOVERSION: SHX5443

## 2015-12-27 SURGERY — ECHOCARDIOGRAM, TRANSESOPHAGEAL
Anesthesia: Moderate Sedation

## 2015-12-27 MED ORDER — SODIUM CHLORIDE 0.9 % IV SOLN
INTRAVENOUS | Status: DC
  Administered 2015-12-27: 12:00:00 via INTRAVENOUS

## 2015-12-27 MED ORDER — MIDAZOLAM HCL 5 MG/ML IJ SOLN
INTRAMUSCULAR | Status: AC
  Filled 2015-12-27: qty 2

## 2015-12-27 MED ORDER — FENTANYL CITRATE (PF) 100 MCG/2ML IJ SOLN
INTRAMUSCULAR | Status: AC
  Filled 2015-12-27: qty 2

## 2015-12-27 MED ORDER — FENTANYL CITRATE (PF) 100 MCG/2ML IJ SOLN
INTRAMUSCULAR | Status: DC | PRN
  Administered 2015-12-27 (×2): 25 ug via INTRAVENOUS

## 2015-12-27 MED ORDER — MIDAZOLAM HCL 10 MG/2ML IJ SOLN
INTRAMUSCULAR | Status: DC | PRN
  Administered 2015-12-27 (×3): 2 mg via INTRAVENOUS

## 2015-12-27 MED ORDER — BUTAMBEN-TETRACAINE-BENZOCAINE 2-2-14 % EX AERO
INHALATION_SPRAY | CUTANEOUS | Status: DC | PRN
  Administered 2015-12-27: 2 via TOPICAL

## 2015-12-27 NOTE — Progress Notes (Signed)
Echocardiogram Echocardiogram Transesophageal has been performed.  Joelene Millin 12/27/2015, 2:49 PM

## 2015-12-27 NOTE — Op Note (Signed)
INDICATIONS: WATCHMAN device followup  PROCEDURE:   Informed consent was obtained prior to the procedure. The risks, benefits and alternatives for the procedure were discussed and the patient comprehended these risks.  Risks include, but are not limited to, cough, sore throat, vomiting, nausea, somnolence, esophageal and stomach trauma or perforation, bleeding, low blood pressure, aspiration, pneumonia, infection, trauma to the teeth and death.    After a procedural time-out, the oropharynx was anesthetized with 20% benzocaine spray.   During this procedure the patient was administered a total of Versed 6 mg and Fentanyl 50 mcg to achieve and maintain moderate conscious sedation.  The patient's heart rate, blood pressure, and oxygen saturationweare monitored continuously during the procedure. The period of conscious sedation was 15 minutes, of which I was present face-to-face 100% of this time.  The transesophageal probe was inserted in the esophagus and stomach without difficulty and multiple views were obtained.  The patient was kept under observation until the patient left the procedure room.  The patient left the procedure room in stable condition.   Agitated microbubble saline contrast was not administered.  COMPLICATIONS:    There were no immediate complications.  FINDINGS:  Bicuspid aortic valve with moderate stenosis and moderate insufficiency. Mildly dilated ascending aorta. WATCHMAN device well seated without peri-device leak. Mild-moderate atherosclerotic plaque in the descending aorta.  RECOMMENDATIONS:   Good results after left atrial appendage occluder placement.  Time Spent Directly with the Patient:  45 minutes   Amontae Ng 12/27/2015, 1:21 PM

## 2015-12-27 NOTE — H&P (Signed)
Chief Complaint:  TEE follow up after Watchman device  HPI:  This is a 70 y.o. male with a past medical history significant for paroxysmal atrial fibrillation, bicuspid AV with aortic dilation here for f/u TEE after left atrial appendage occluder device placed 10/31/15.    PMHx:  Past Medical History  Diagnosis Date  . GERD (gastroesophageal reflux disease)   . Hyperlipidemia   . Hypertension   . Nephrolithiasis   . Paroxysmal atrial fibrillation (HCC)     Eliquis; chads2vasc score is at least 3; electrophys did placement of Watchman left atrial appendage occlusive device 10/2015.  . Bicuspid aortic valve     aortic stenosis, aortic regurgitation (bicuspid aortic valve)  . CAD (coronary artery disease)     LAD dz but no corresponding ischemia on myoview  . Aortic aneurysm, thoracic (HCC)     bicuspid aortic valve. aortic stenosis, aortic regurg  . H/O hiatal hernia   . Wears glasses   . Fatty liver 04/2012; 06/2013    Noted on noncontrast abd CT done during trauma w/u when tractor rolled onto his knee.  Also noted on noncontrast chest CT done to screen for asbestos lung damage.  . Asbestos exposure CT 06/2013    Noncalcified pleural plaques bilat; no signs of malignancy.  . Chronic renal insufficiency, stage III (moderate) 06/2013    CrCl about 50 ml/min  . Prediabetes 2015    A1c 6.4%.  A1c 6.1% 08/2015    Past Surgical History  Procedure Laterality Date  . Esophagogastroduodenoscopy  02/23/2011    Barrett's esophagus, hiatal hernia - dilated for dysphagia  . Colonoscopy    . Tendon repair  11/06/2011    Procedure: TENDON REPAIR;  Surgeon: Cammie Sickle., MD;  Location: [facility];  Service: Orthopedics;  Laterality: Left;  explore/repair tendons left hand   . Cardiac catheterization  06/24/10  . Circumcision  01/18/2012    Procedure: CIRCUMCISION ADULT;  Surgeon: [name], MD;  [facility];  Service: Urology;  Laterality: N/A;   30 mins requested for this case   . Shoulder surgery      Arthroscopic 02/23/12  . Implantable loop recorder placement  06/29/14    MDT LINQ implanted by Dr [name] in the office as part of the RIO II protocol. + A fib confirmation.  Darden Dates without cardioversion N/A 07/25/2015    Dr. [name]: EF 60-65%, normal LV wall motion, functionally bicuspid aortic valve with mild AI and mild AS, ascending aorta dilated to 4 cm, left atrial appendage measured for Watchman procedure/device  . Watchman placement  10/31/15    Left atrial appendage occlusive device   . Left atrial appendage occlusion N/A 10/31/2015    Procedure: LEFT ATRIAL APPENDAGE OCCLUSION;  Surgeon: [name], MD;  Location: [facility];  Service: Cardiovascular;  Laterality: N/A;    FAMHx:  Family History  Problem Relation Age of Onset  . Arthritis Mother   . Diabetes Mother   . Stroke Mother     In her 39s  . Heart attack Father 49  . Asthma Father   . Emphysema Father   . Heart disease Father   . Hyperlipidemia Neg Hx   . Hypertension Neg Hx     SOCHx:   reports that he quit smoking about 22 years ago. He has never used smokeless tobacco. He reports that he does not drink alcohol or use illicit drugs.  ALLERGIES:  No Known Allergies  ROS: A comprehensive review of systems was negative.  HOME MEDS: Medications Prior to Admission  Medication Sig Dispense Refill  . apixaban (ELIQUIS) 5 MG TABS tablet Take 1 tablet (5 mg total) by mouth 2 (two) times daily. 180 tablet 3  . arginine 500 MG tablet Take 500 mg by mouth daily. Cardio for Life supplement    . aspirin EC 81 MG tablet Take 1 tablet (81 mg total) by mouth daily.    Marland Kitchen atorvastatin (LIPITOR) 40 MG tablet TAKE ONE TABLET BY MOUTH ONCE DAILY 90 tablet 3  . benazepril-hydrochlorthiazide (LOTENSIN HCT) 20-12.5 MG tablet Take 2 tablets by mouth daily. 180 tablet 3  . diltiazem (CARDIZEM) 30 MG tablet Take 1 tablet (30 mg total) by mouth every 6 (six) hours as  needed (heart rate > 110 bpm). 30 tablet 0  . metoprolol tartrate (LOPRESSOR) 25 MG tablet Take 25 mg by mouth daily as needed (PALPITATIONS).    Marland Kitchen omeprazole (PRILOSEC) 40 MG capsule Take 1 capsule (40 mg total) by mouth daily. 90 capsule 3    LABS/IMAGING: No results found for this or any previous visit (from the past 48 hour(s)). No results found.  VITALS: Blood pressure 155/81, pulse 53, temperature 97.8 F (36.6 C), temperature source Oral, resp. rate 11, height 5\' 11"  (1.803 m), weight 93.441 kg (206 lb), SpO2 100 %.  EXAM:  General: Alert, oriented x3, no distress Head: no evidence of trauma, PERRL, EOMI, no exophtalmos or lid lag, no myxedema, no xanthelasma; normal ears, nose and oropharynx Neck: normal jugular venous pulsations and no hepatojugular reflux; brisk carotid pulses without delay and no carotid bruits Chest: clear to auscultation, no signs of consolidation by percussion or palpation, normal fremitus, symmetrical and full respiratory excursions Cardiovascular: normal position and quality of the apical impulse, regular rhythm, normal first heart sound and distinct second heart sounds, no rubs or gallops, 2/6 systolic and 2/6 diastolic aortic murmurs Abdomen: no tenderness or distention, no masses by palpation, no abnormal pulsatility or arterial bruits, normal bowel sounds, no hepatosplenomegaly Extremities: no clubbing, cyanosis or edema; 2+ radial, ulnar and brachial pulses bilaterally; 2+ right femoral, posterior tibial and dorsalis pedis pulses; 2+ left femoral, posterior tibial and dorsalis pedis pulses; no subclavian or femoral bruits Neurological: grossly nonfocal    PLAN: This procedure has been fully reviewed with the patient and written informed consent has been obtained.   Sanda Klein, MD, [facility] [phone] office [phone] pager  12/27/2015, 1:10 PM

## 2015-12-27 NOTE — Discharge Instructions (Signed)

## 2015-12-30 ENCOUNTER — Encounter (HOSPITAL_COMMUNITY): Payer: Self-pay | Admitting: Cardiology

## 2016-01-06 ENCOUNTER — Ambulatory Visit (INDEPENDENT_AMBULATORY_CARE_PROVIDER_SITE_OTHER): Payer: Medicare Other | Admitting: Internal Medicine

## 2016-01-06 ENCOUNTER — Encounter: Payer: Self-pay | Admitting: Internal Medicine

## 2016-01-06 VITALS — BP 110/72 | HR 81 | Ht 71.0 in | Wt 211.2 lb

## 2016-01-06 DIAGNOSIS — R410 Disorientation, unspecified: Secondary | ICD-10-CM | POA: Diagnosis not present

## 2016-01-06 DIAGNOSIS — I359 Nonrheumatic aortic valve disorder, unspecified: Secondary | ICD-10-CM | POA: Diagnosis not present

## 2016-01-06 DIAGNOSIS — I480 Paroxysmal atrial fibrillation: Secondary | ICD-10-CM

## 2016-01-06 DIAGNOSIS — I10 Essential (primary) hypertension: Secondary | ICD-10-CM

## 2016-01-06 MED ORDER — CLOPIDOGREL BISULFATE 75 MG PO TABS: 75.0000 mg | ORAL_TABLET | Freq: Every day | ORAL | 3 refills | Status: DC

## 2016-01-06 MED ORDER — ASPIRIN EC 325 MG PO TBEC: 325.0000 mg | DELAYED_RELEASE_TABLET | Freq: Every day | ORAL | 0 refills | Status: DC

## 2016-01-06 NOTE — Patient Instructions (Signed)
Medication Instructions:  Your physician has recommended you make the following change in your medication:  1) Stop Eliquis 2) Start Aspirin 325 mg daily 3) Start Plavix 75 mg daily   Labwork: None ordered   Testing/Procedures: None ordered   Follow-Up: You have been referred to Dr Erlinda Hong with Neurology for headache and confusion  Your physician recommends that you schedule a follow-up appointment in: November with Dr [name]    Any Other Special Instructions Will Be Listed Below (If Applicable).     If you need a refill on your cardiac medications before your next appointment, please call your pharmacy.

## 2016-01-06 NOTE — Progress Notes (Signed)
Electrophysiology Office Note Date: 01/06/2016  ID:  EWING PASSARIELLO, DOB December 05, 1945, MRN [id number]  PCP: Tammi Sou, MD Primary Cardiologist: [name] Electrophysiologist: [name]  CC: Watchman follow up  Samuel Willis is a 70 y.o. male seen today for Dr [name].  He is s/p Watchman implant 10/31/15.  He has done well.  He denies chest pain, palpitations, dyspnea, PND, orthopnea, nausea, vomiting, dizziness, syncope, edema, weight gain, or early satiety.  He reports rare fleeting sharp L occipital head pains.  These are worrisome to him.  He also reports that occasionally he finds himself forgetting where he is (getting lost in a familiar environment) and requires several minutes to redirect himself.  Past Medical History:  Diagnosis Date  . Aortic aneurysm, thoracic (HCC)    bicuspid aortic valve. aortic stenosis, aortic regurg  . Asbestos exposure CT 06/2013   Noncalcified pleural plaques bilat; no signs of malignancy.  . Bicuspid aortic valve    aortic stenosis, aortic regurgitation (bicuspid aortic valve)  . CAD (coronary artery disease)    LAD dz but no corresponding ischemia on myoview  . Chronic renal insufficiency, stage III (moderate) 06/2013   CrCl about 50 ml/min  . Fatty liver 04/2012; 06/2013   Noted on noncontrast abd CT done during trauma w/u when tractor rolled onto his knee.  Also noted on noncontrast chest CT done to screen for asbestos lung damage.  Marland Kitchen GERD (gastroesophageal reflux disease)   . H/O hiatal hernia   . Hyperlipidemia   . Hypertension   . Nephrolithiasis   . Paroxysmal atrial fibrillation (HCC)    Eliquis; chads2vasc score is at least 3; electrophys did placement of Watchman left atrial appendage occlusive device 10/2015.  Marland Kitchen Prediabetes 2015   A1c 6.4%.  A1c 6.1% 08/2015  . Wears glasses    Past Surgical History:  Procedure Laterality Date  . CARDIAC CATHETERIZATION  06/24/10  . CIRCUMCISION  01/18/2012   Procedure: CIRCUMCISION ADULT;   Surgeon: [name], MD;  [facility];  Service: Urology;  Laterality: N/A;  30 mins requested for this case   . COLONOSCOPY    . ESOPHAGOGASTRODUODENOSCOPY  02/23/2011   Barrett's esophagus, hiatal hernia - dilated for dysphagia  . Implantable loop recorder placement  06/29/14   MDT LINQ implanted by Dr [name] in the office as part of the RIO II protocol. + A fib confirmation.  Marland Kitchen LEFT ATRIAL APPENDAGE OCCLUSION N/A 10/31/2015   Procedure: LEFT ATRIAL APPENDAGE OCCLUSION;  Surgeon: [name], MD;  Location: [facility];  Service: Cardiovascular;  Laterality: N/A;  . SHOULDER SURGERY     Arthroscopic 02/23/12  . TEE WITHOUT CARDIOVERSION N/A 07/25/2015   Dr. [name]: EF 60-65%, normal LV wall motion, functionally bicuspid aortic valve with mild AI and mild AS, ascending aorta dilated to 4 cm, left atrial appendage measured for Watchman procedure/device  . TEE WITHOUT CARDIOVERSION N/A 12/27/2015   Procedure: TRANSESOPHAGEAL ECHOCARDIOGRAM (TEE);  Surgeon: Dorothy Spark, MD;  Location: [facility];  Service: Cardiovascular;  Laterality: N/A;  . TENDON REPAIR  11/06/2011   Procedure: TENDON REPAIR;  Surgeon: Cammie Sickle., MD;  Location: [place];  Service: Orthopedics;  Laterality: Left;  explore/repair tendons left hand   . WATCHMAN PLACEMENT  10/31/15   Left atrial appendage occlusive device     Current Outpatient Prescriptions  Medication Sig Dispense Refill  . arginine 500 MG tablet Take 500 mg by mouth daily. Cardio for  Life supplement    . atorvastatin (LIPITOR) 40 MG tablet TAKE ONE TABLET BY MOUTH ONCE DAILY 90 tablet 3  . benazepril-hydrochlorthiazide (LOTENSIN HCT) 20-12.5 MG tablet Take 2 tablets by mouth daily. 180 tablet 3  . metoprolol tartrate (LOPRESSOR) 25 MG tablet Take 25 mg by mouth daily as needed (PALPITATIONS).    Marland Kitchen omeprazole (PRILOSEC) 40 MG capsule Take 1 capsule (40 mg total) by mouth daily. 90 capsule 3  .  aspirin EC 325 MG tablet Take 1 tablet (325 mg total) by mouth daily. 30 tablet 0  . clopidogrel (PLAVIX) 75 MG tablet Take 1 tablet (75 mg total) by mouth daily. 90 tablet 3   No current facility-administered medications for this visit.     Allergies:   Review of patient's allergies indicates no known allergies.   Social History: Social History   Social History  . Marital status: Married    Spouse name: N/A  . Number of children: 2  . Years of education: N/A   Occupational History  . Self employed Self Employed   Social History Main Topics  . Smoking status: Former Smoker    Quit date: 11/03/1993  . Smokeless tobacco: Never Used  . Alcohol use No  . Drug use: No  . Sexual activity: Not on file   Other Topics Concern  . Not on file   Social History Narrative   Married, 2 daughters.   Orig from [place].   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   No T/A/Ds.              Family History: Family History  Problem Relation Age of Onset  . Arthritis Mother   . Diabetes Mother   . Stroke Mother     In her 42s  . Heart attack Father 99  . Asthma Father   . Emphysema Father   . Heart disease Father   . Hyperlipidemia Neg Hx   . Hypertension Neg Hx     Review of Systems: All other systems reviewed and are otherwise negative except as noted above.   Physical Exam: VS:  BP 110/72   Pulse 81   Ht 5\' 11"  (1.803 m)   Wt 211 lb 3.2 oz (95.8 kg)   BMI 29.46 kg/m  , BMI Body mass index is 29.46 kg/m. Wt Readings from Last 3 Encounters:  01/06/16 211 lb 3.2 oz (95.8 kg)  12/27/15 206 lb (93.4 kg)  11/06/15 206 lb (93.4 kg)    GEN- The patient is well appearing, alert and oriented x 3 today.   HEENT: normocephalic, atraumatic; sclera clear, conjunctiva pink; hearing intact; oropharynx clear; neck supple Lungs- Clear to ausculation bilaterally, normal work of breathing.  No wheezes, rales, rhonchi Heart-  Regular rate and rhythm GI- soft, non-tender, non-distended, bowel sounds present Extremities- no clubbing, cyanosis, or edema; DP/PT/radial pulses 2+ bilaterally MS- no significant deformity or atrophy Skin- warm and dry, no rash or lesion  Psych- euthymic mood, full affect Neuro- strength and sensation are intact  Recent Labs: 08/23/2015: ALT 24; ALT 24; TSH 1.58 10/31/2015: Hemoglobin 14.2; Platelets 155 11/01/2015: BUN 15; Creatinine, Ser 1.21; Potassium 4.1; Sodium 144    Other studies Reviewed: Additional studies/ records that were reviewed today include: recent TEE, ILR  Assessment and Plan: 1.  Paroxysmal atrial fibrillation Doing well s/p watchman Recent TEE reveals device well seated without leaks. No bleeding issues  Stop eliquis Increase asa to 325mg   daily Add plavix Upon follow-up with Dr [name] in November, Plavix could be discontinued.  2. HTN Stable No change required today  3.  Bicuspid aortic valve Followed by Dr [name]   4. ? Headache/ difficulty with memory Will refer to neurology for further assessment No focal findings on neuro exam  Follow-up with Dr [name] 11/17 I will see when needed going forward  Signed, [name], MD   01/06/2016 4:52 PM   [place] [address] [place] [address] [phone] (office) [phone] (fax)

## 2016-01-07 LAB — CUP PACEART INCLINIC DEVICE CHECK: MDC IDC SESS DTM: 20170724192139

## 2016-01-09 ENCOUNTER — Ambulatory Visit (INDEPENDENT_AMBULATORY_CARE_PROVIDER_SITE_OTHER): Payer: Medicare Other | Admitting: Neurology

## 2016-01-09 ENCOUNTER — Encounter: Payer: Self-pay | Admitting: Neurology

## 2016-01-09 VITALS — BP 122/91 | HR 66 | Ht 71.0 in | Wt 212.6 lb

## 2016-01-09 DIAGNOSIS — H2513 Age-related nuclear cataract, bilateral: Secondary | ICD-10-CM | POA: Diagnosis not present

## 2016-01-09 DIAGNOSIS — G3184 Mild cognitive impairment, so stated: Secondary | ICD-10-CM

## 2016-01-09 DIAGNOSIS — I482 Chronic atrial fibrillation, unspecified: Secondary | ICD-10-CM

## 2016-01-09 DIAGNOSIS — M5481 Occipital neuralgia: Secondary | ICD-10-CM | POA: Insufficient documentation

## 2016-01-09 DIAGNOSIS — I10 Essential (primary) hypertension: Secondary | ICD-10-CM

## 2016-01-09 DIAGNOSIS — R410 Disorientation, unspecified: Secondary | ICD-10-CM | POA: Diagnosis not present

## 2016-01-09 NOTE — Patient Instructions (Addendum)
-   continue ASA and plavix and lipitor - follow up with cardiology for afib and watchman device - will do EEG to rule out seizure like activity - your symptoms are more likely caused by lack of attention and concentration.  - you may have what we called mild cognitive impairment (MCI) but we will continue observe and nothing to worry now - the back of head pain is likely due to nerve impingement. If more frequent, may consider occipital nerve block.  - follow up in 3 months.

## 2016-01-09 NOTE — Progress Notes (Signed)
NEUROLOGY CLINIC NEW PATIENT NOTE  NAME: Samuel Willis DOB: 1945/06/18 REFERRING PHYSICIAN: Dr. Rayann Willis  I saw Samuel Willis as a new consult in the neurovascular clinic today regarding  Chief Complaint  Patient presents with  . Referral    REFERRAL FOR DR> Thompson Willis for confusion, internal referral. When patient was brought back to room he was on the phone from the time i call his name to the exam room. Once in the room the Rn ask if he wanted me to come back once he is done. 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 Rn stated she will come back once he is done. Pt was room at 1005. Rn also explain that we have to do a MOCHa memory test.  .  HPI: Samuel Willis is a 70 y.o. male with PMH of pAfib s/p watchman device on DAPT, HTN,  And bicuspid AV who presents as a new patient for confusion episodes.   Pt stated that he had several episodes of confusion while he is driving. 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 He felt lost when he drove to an intersection which he suppose to know well, but it took him a few minutes to figure it out. This happened a dozen times for the last 3 months, but of note, it happened at the same intersection. He also admitted that he was thinking about his business at the time they were happening. He denies the confusion happened in any other occasions or places. He denies any HA, LOC, vision changes, heart palpitation, sweating, chest pain, N/V, dizziness, weakness or numbness.   He also complains of pain at right side back of head 2 days ago this week, lasting about 2-3 hours and resolved. Felt some neck pain and tension but mild. No HA history. Not recurrent.   He has hx of afib s/p watchman device with Samuel Willis on 10/31/15. Finished eliquis and now on ASA and plavix. Plan to take off plavix in 04/2016. Tolerating well and no complains. HTN on lotensin and metoprolol. Today BP 122/91.   Past Medical  History:  Diagnosis Date  . Aortic aneurysm, thoracic (HCC)    bicuspid aortic valve. aortic stenosis, aortic regurg  . Asbestos exposure CT 06/2013   Noncalcified pleural plaques bilat; no signs of malignancy.  . Bicuspid aortic valve    aortic stenosis, aortic regurgitation (bicuspid aortic valve)  . CAD (coronary artery disease)    LAD dz but no corresponding ischemia on myoview  . Chronic renal insufficiency, stage III (moderate) 06/2013   CrCl about 50 ml/min  . Fatty liver 04/2012; 06/2013   Noted on noncontrast abd CT done during trauma w/u when tractor rolled onto his knee.  Also noted on noncontrast chest CT done to screen for asbestos lung damage.  Samuel Willis GERD (gastroesophageal reflux disease)   . H/O hiatal hernia   . Hyperlipidemia   . Hypertension   . Nephrolithiasis   . Paroxysmal atrial fibrillation (HCC)    ASA 325 + plavix as of 01/06/16;  electrophys did placement of Watchman left atrial appendage occlusive device 10/2015.  Samuel Willis Prediabetes 2015   A1c 6.4%.  A1c 6.1% 08/2015   Past Surgical History:  Procedure Laterality Date  . CARDIAC CATHETERIZATION  06/24/10  . CIRCUMCISION  01/18/2012   Procedure: CIRCUMCISION ADULT;  Surgeon: [name], MD;  Location: [facility];  Service: Urology;  Laterality: N/A;  30 mins requested for this case   . COLONOSCOPY    . ESOPHAGOGASTRODUODENOSCOPY  02/23/2011   Barrett's esophagus, hiatal hernia - dilated for dysphagia  . Implantable loop recorder placement  06/29/14   MDT LINQ implanted by Dr Rayann Willis in the office as part of the RIO II protocol. + A fib confirmation.  Samuel Willis LEFT ATRIAL APPENDAGE OCCLUSION N/A 10/31/2015   Procedure: LEFT ATRIAL APPENDAGE OCCLUSION;  Surgeon: [name], MD;  Location: [facility];  Service: Cardiovascular;  Laterality: N/A;  . SHOULDER SURGERY     Arthroscopic 02/23/12  . TEE WITHOUT CARDIOVERSION N/A 07/25/2015   Samuel Willis: EF 60-65%, normal LV wall motion, functionally bicuspid  aortic valve with mild AI and mild AS, ascending aorta dilated to 4 cm, left atrial appendage measured for Watchman procedure/device  . TEE WITHOUT CARDIOVERSION N/A 12/27/2015   Procedure: TRANSESOPHAGEAL ECHOCARDIOGRAM (TEE);  Surgeon: Samuel Spark, MD;  Location: [facility];  Service: Cardiovascular;  Laterality: N/A;  . TENDON REPAIR  11/06/2011   Procedure: TENDON REPAIR;  Surgeon: Samuel Willis., MD;  Location: [place];  Service: Orthopedics;  Laterality: Left;  explore/repair tendons left hand   . WATCHMAN PLACEMENT  10/31/15   Left atrial appendage occlusive device    Family History  Problem Relation Age of Onset  . Arthritis Mother   . Diabetes Mother   . Stroke Mother     In her 72s  . Heart attack Father 69  . Asthma Father   . Emphysema Father   . Heart disease Father   . Hyperlipidemia Neg Hx   . Hypertension Neg Hx    Current Outpatient Prescriptions  Medication Sig Dispense Refill  . arginine 500 MG tablet Take 500 mg by mouth daily. Cardio for Life supplement    . aspirin EC 325 MG tablet Take 1 tablet (325 mg total) by mouth daily. 30 tablet 0  . atorvastatin (LIPITOR) 40 MG tablet TAKE ONE TABLET BY MOUTH ONCE DAILY 90 tablet 3  . benazepril-hydrochlorthiazide (LOTENSIN HCT) 20-12.5 MG tablet Take 2 tablets by mouth daily. 180 tablet 3  . clopidogrel (PLAVIX) 75 MG tablet Take 1 tablet (75 mg total) by mouth daily. 90 tablet 3  . metoprolol tartrate (LOPRESSOR) 25 MG tablet Take 25 mg by mouth daily as needed (PALPITATIONS).    Samuel Willis omeprazole (PRILOSEC) 40 MG capsule Take 1 capsule (40 mg total) by mouth daily. 90 capsule 3   No current facility-administered medications for this visit.    No Known Allergies Social History   Social History  . Marital status: Married    Spouse name: N/A  . Number of children: 2  . Years of education: N/A   Occupational History  . Self employed Self Employed   Social History Main Topics  . Smoking  status: Former Smoker    Quit date: 11/03/1993  . Smokeless tobacco: Never Used  . Alcohol use No  . Drug use: No  . Sexual activity: Not on file   Other Topics Concern  . Not on file   Social History Narrative   Married, 2 daughters.   Orig from [place].   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   No T/A/Ds.              Review of Systems Full 14 system review of systems performed and notable only for those listed, all others are neg:  Constitutional:  Cardiovascular: palpitation, murmur Ear/Nose/Throat:  Hearing loss, ringing in ears Skin: itching, moles Eyes:  Blurry vision Respiratory:   Gastroitestinal:   Genitourinary: urination problems Hematology/Lymphatic:   Endocrine:  Musculoskeletal:   Allergy/Immunology:   Neurological:  Memory loss, confusion, HA Psychiatric:  Sleep: snoring, restless legs   Physical Exam  Vitals:   01/09/16 1021  BP: (!) 122/91  Pulse: 22    General - Well nourished, well developed, in no apparent distress.  Ophthalmologic - Sharp disc margins OU.  Cardiovascular - irregularly irregular heart rate and rhythm. Carotid pulses were 2+ without bruits .   Neck - supple, no nuchal rigidity .  Mental Status -  Level of arousal and orientation to time, place, and person were intact. Language including expression, naming, repetition, comprehension, reading, and writing was assessed and found intact. Attention span and concentration were normal. Recent and remote memory were intact. Fund of Knowledge was assessed and was intact.  Sallis visuospatial - executive 5/5 Naming - 3/3 Memory -  Attention - 2/2, 1/1, 3/3 Language - 2/2, 1/1 Abstraction - 2/2 Delayed recall - 1/5 Orientation - 6/6 Total - 26/30  Cranial Nerves II - XII - II - Visual field intact OU. III, IV, VI - Extraocular movements intact. V - Facial sensation intact bilaterally. VII - Facial movement  intact bilaterally. VIII - Hearing & vestibular intact bilaterally. X - Palate elevates symmetrically. XI - Chin turning & shoulder shrug intact bilaterally. XII - Tongue protrusion intact.  Motor Strength - The patient's strength was normal in all extremities and pronator drift was absent.  Bulk was normal and fasciculations were absent.   Motor Tone - Muscle tone was assessed at the neck and appendages and was normal.  Reflexes - The patient's reflexes were normal in all extremities and he had no pathological reflexes.  Sensory - Light touch, temperature/pinprick, vibration and proprioception, and Romberg testing were assessed and were normal.    Coordination - The patient had normal movements in the hands and feet with no ataxia or dysmetria.  Tremor was absent.  Gait and Station - The patient's transfers, posture, gait, station, and turns were observed as normal.   Imaging None   Lab Review Component     Latest Ref Rng & Units 08/23/2015  Cholesterol     0 - 200 mg/dL 128  Triglycerides     0.0 - 149.0 mg/dL 59.0  HDL Cholesterol     >39.00 mg/dL 44.10  VLDL     0.0 - 40.0 mg/dL 11.8  LDL (calc)     0 - 99 mg/dL 72  Total CHOL/HDL Ratio      3  NonHDL      83.52  Hemoglobin A1C     4.6 - 6.5 % 6.1  TSH     0.35 - 4.50 uIU/mL 1.58     Assessment and Plan:   In summary, DRAKEN MCKINEY is a 70 y.o. male with PMH of  presents pAfib s/p watchman device on DAPT, HTN,  And bicuspid AV who presents as a new patient for confusion episodes. However, on detailed questioning, these episodes happened at same place with distracting without other associated neuro symptoms, argues against TIA, arrhythmia, seizure, or complicated migraine. Most likely due to lack of attention and concentration. His one time right occipital pain likely due to occipital neuralgia but resolved and no recurrence. He dose have 1/5 delayed recall but no other domain deficit on MOCA, could be MCI, will  continue  to observe. Will do EEG to rule out seizure.   - continue ASA and plavix and lipitor - follow up with cardiology for afib and watchman device - will do EEG to rule out seizure like activity - confusion symptoms are more likely caused by lack of attention and concentration.  - may have mild cognitive impairment but will continue observe and nothing to worry now - the back of head pain is likely due to mild occipital neuralgia. If more frequent, may consider occipital nerve block.  - follow up in 3 months.   Thank you very much for the opportunity to participate in the care of this patient.  Please do not hesitate to call if any questions or concerns arise.  Orders Placed This Encounter  Procedures  . EEG adult    Standing Status:   Future    Standing Expiration Date:   01/08/2017    Order Specific Question:   Where should this test be performed?    Answer:   [facility]    Meds ordered this encounter  Medications  . DISCONTD: ELIQUIS 5 MG TABS tablet    Patient Instructions  - continue ASA and plavix and lipitor - follow up with cardiology for afib and watchman device - will do EEG to rule out seizure like activity - your symptoms are more likely caused by lack of attention and concentration.  - you may have what we called mild cognitive impairment (MCI) but we will continue observe and nothing to worry now - the back of head pain is likely due to nerve impingement. If more frequent, may consider occipital nerve block.  - follow up in 3 months.    Samuel Hawking, MD PhD [facility] [address] [place], [place] [address] [phone]

## 2016-01-14 HISTORY — PX: OTHER SURGICAL HISTORY: SHX169

## 2016-01-15 LAB — CUP PACEART REMOTE DEVICE CHECK: MDC IDC SESS DTM: 20170709001433

## 2016-01-15 NOTE — Progress Notes (Signed)
Carelink summary report received. Battery status OK. Normal device function. No new symptom episodes, tachy episodes, brady, or pause episodes. 4 AF 0.6% +Watchman +ASA 325 mg. Monthly summary reports and ROV/PRN

## 2016-01-20 ENCOUNTER — Ambulatory Visit (INDEPENDENT_AMBULATORY_CARE_PROVIDER_SITE_OTHER): Payer: Medicare Other | Admitting: *Deleted

## 2016-01-20 DIAGNOSIS — I480 Paroxysmal atrial fibrillation: Secondary | ICD-10-CM

## 2016-01-21 NOTE — Progress Notes (Signed)
Carelink Summary Report / Loop Recorder 

## 2016-01-22 ENCOUNTER — Encounter: Payer: Self-pay | Admitting: Family Medicine

## 2016-01-27 LAB — CUP PACEART REMOTE DEVICE CHECK: Date Time Interrogation Session: 20170808003604

## 2016-02-10 ENCOUNTER — Ambulatory Visit (INDEPENDENT_AMBULATORY_CARE_PROVIDER_SITE_OTHER): Payer: Medicare Other | Admitting: Neurology

## 2016-02-10 DIAGNOSIS — R410 Disorientation, unspecified: Secondary | ICD-10-CM

## 2016-02-10 NOTE — Procedures (Signed)
    History:  Samuel Willis is a 70 year old gentleman with a history of atrial fibrillation who has had several episodes of confusion he is driving. The patient was getting lost in areas that he usually will know quite well. He denies any other episodes confusion. He is being evaluated for these events.  This is a routine EEG. No skull defects are noted. Medications include aspirin, Lipitor, Lotensin, Plavix, Lopressor, and Prilosec.   EEG classification: Normal awake  Description of the recording: The background rhythms of this recording consists of a fairly well modulated medium amplitude alpha rhythm of 10 Hz that is reactive to eye opening and closure. As the record progresses, the patient appears to remain in the waking state throughout the recording. Photic stimulation was performed, resulting in a bilateral and symmetric photic driving response. Hyperventilation was also performed, resulting in a minimal buildup of the background rhythm activities without significant slowing seen. At no time during the recording does there appear to be evidence of spike or spike wave discharges or evidence of focal slowing. EKG monitor shows no evidence of cardiac rhythm abnormalities with a heart rate of 54.  Impression: This is a normal EEG recording in the waking state. No evidence of ictal or interictal discharges are seen.

## 2016-02-11 ENCOUNTER — Encounter: Payer: Self-pay | Admitting: Family Medicine

## 2016-02-19 ENCOUNTER — Ambulatory Visit (INDEPENDENT_AMBULATORY_CARE_PROVIDER_SITE_OTHER): Payer: Medicare Other | Admitting: *Deleted

## 2016-02-19 DIAGNOSIS — I480 Paroxysmal atrial fibrillation: Secondary | ICD-10-CM | POA: Diagnosis not present

## 2016-02-20 NOTE — Progress Notes (Signed)
Carelink Summary Report / Loop Recorder 

## 2016-03-20 ENCOUNTER — Ambulatory Visit (INDEPENDENT_AMBULATORY_CARE_PROVIDER_SITE_OTHER): Payer: Medicare Other | Admitting: *Deleted

## 2016-03-20 DIAGNOSIS — I480 Paroxysmal atrial fibrillation: Secondary | ICD-10-CM | POA: Diagnosis not present

## 2016-03-21 LAB — CUP PACEART REMOTE DEVICE CHECK: MDC IDC SESS DTM: 20170907004024

## 2016-03-21 NOTE — Progress Notes (Signed)
Carelink summary report received. Battery status OK. Normal device function. No new symptom episodes, tachy episodes, brady, or pause episodes. No new AF episodes. Monthly summary reports and ROV/PRN 

## 2016-03-23 NOTE — Progress Notes (Signed)
Carelink Summary Report / Loop Recorder 

## 2016-04-02 DIAGNOSIS — L814 Other melanin hyperpigmentation: Secondary | ICD-10-CM | POA: Diagnosis not present

## 2016-04-02 DIAGNOSIS — L821 Other seborrheic keratosis: Secondary | ICD-10-CM | POA: Diagnosis not present

## 2016-04-02 DIAGNOSIS — Z872 Personal history of diseases of the skin and subcutaneous tissue: Secondary | ICD-10-CM | POA: Diagnosis not present

## 2016-04-02 DIAGNOSIS — L570 Actinic keratosis: Secondary | ICD-10-CM | POA: Diagnosis not present

## 2016-04-02 DIAGNOSIS — D225 Melanocytic nevi of trunk: Secondary | ICD-10-CM | POA: Diagnosis not present

## 2016-04-14 ENCOUNTER — Ambulatory Visit: Payer: Medicare Other | Admitting: Neurology

## 2016-04-20 ENCOUNTER — Encounter: Payer: Self-pay | Admitting: Cardiology

## 2016-04-20 ENCOUNTER — Ambulatory Visit (INDEPENDENT_AMBULATORY_CARE_PROVIDER_SITE_OTHER): Payer: Medicare Other | Admitting: *Deleted

## 2016-04-20 ENCOUNTER — Ambulatory Visit (INDEPENDENT_AMBULATORY_CARE_PROVIDER_SITE_OTHER): Payer: Medicare Other | Admitting: Cardiology

## 2016-04-20 VITALS — BP 132/80 | HR 72 | Ht 71.0 in | Wt 219.0 lb

## 2016-04-20 DIAGNOSIS — I480 Paroxysmal atrial fibrillation: Secondary | ICD-10-CM

## 2016-04-20 DIAGNOSIS — I712 Thoracic aortic aneurysm, without rupture: Secondary | ICD-10-CM

## 2016-04-20 DIAGNOSIS — I359 Nonrheumatic aortic valve disorder, unspecified: Secondary | ICD-10-CM

## 2016-04-20 DIAGNOSIS — Q231 Congenital insufficiency of aortic valve: Secondary | ICD-10-CM

## 2016-04-20 DIAGNOSIS — I7121 Aneurysm of the ascending aorta, without rupture: Secondary | ICD-10-CM

## 2016-04-20 DIAGNOSIS — I350 Nonrheumatic aortic (valve) stenosis: Secondary | ICD-10-CM | POA: Diagnosis not present

## 2016-04-20 NOTE — Patient Instructions (Signed)
Medication Instructions:  Your physician recommends that you continue on your current medications as directed. Please refer to the Current Medication list given to you today.   Labwork: None   Testing/Procedures: Your physician has requested that you have an echocardiogram. Echocardiography is a painless test that uses sound waves to create images of your heart. It provides your doctor with information about the size and shape of your heart and how well your heart's chambers and valves are working. This procedure takes approximately one hour. There are no restrictions for this procedure. June 2018  Schedule an appointment for an MRA of your chest in June 2018  Follow-Up: Your physician wants you to follow-up in: 1 year with Dr [name] in the [facility] at [facility] will receive a reminder letter in the mail two months in advance.   Any Other Special Instructions Will Be Listed Below (If Applicable).     If you need a refill on your cardiac medications before your next appointment, please call your pharmacy.

## 2016-04-20 NOTE — Progress Notes (Signed)
Carelink Summary Report / Loop Recorder 

## 2016-04-21 NOTE — Progress Notes (Signed)
Patient ID: Samuel Willis, male   DOB: September 26, 1945, 70 y.o.   MRN: [id number] PCP: Dr. Anitra Lauth  70 yo with history of bicuspid aortic valve and mild-moderate AS/moderate AI as well as paroxysmal atrial fibrillation presents for followup. TEE in 7/1 7 showed normal LV systolic function with bicuspid aortic valve and mild to moderate AS, moderate AI.  He had a Watchman device placed in 5/17.   He is generally doing well.  Very active.  No palpitations recently.  BP is controlled.  Mild dyspnea when he jogs, otherwise no exertional dyspnea. No chest pain.  No orthopnea/PND.    ECG: NSR, PVCs  Labs (12/10): creatinine 1.4  Labs (6/12): K 4.5, creatinine 1.2, LDL 66, HDL 48 Labs (7/14): K 4.5, creatinine 1.3, LDL 71, HDL 49 Labs (1/15): K 4.8, creatinine 1.5, LDL 58, HDL 36 Lbas (2/16): K 4.5, creatinine 1.17, LDL 83, HDL 42, TSH normal Labs (3/17): LDL 72, HDL 44 Labs (5/17): K 4.1, creatinine 1.2  Allergies (verified):  No Known Drug Allergies   Past Medical History:  1.  Atrial fibrillation: Paroxysmal. Patient has a brief episode of irregular heart beating every 1-2 years. One was apparently documented as atrial fibrillation years ago in [employer]. His CHADSVASC score is 3 now that CAD has been found. LINQ monitor was placed and has documented paroxysmal atrial fibrillation. - S/p Watchman placement in 5/17.  2. Hypertension.  3. Hyperlipidemia.  4. CAD: Exercise treadmill Myoview done in January 2010. The patient exercised for 10 minutes and 46 seconds. He stopped due to fatigue. EF was 56%. There was normal perfusion with no evidence for ischemia or infarction. LHC (1/12): 60-70% mLAD stenosis.  ETT-myoview was then done to assess for ischemia in the LAD territory. Patient exercised 10', EF was 69% with no evidence for ischemia or infarction.   5. Aortic stenosis and aortic regurgitation (bicuspid aortic valve). The patient did have an echocardiogram done on June 26, 2008. EF was 65%.  There were no regional wall motion abnormalities. There was moderate aortic regurgitation. There was mild aortic stenosis by mean gradient which was 12 mmHg. There was mild ascending aorta and aortic root dilation. There was mild mitral regurgitation, mild left atrial enlargement, and right atrial enlargement. TEE was done, confirming a functionally bicuspid aortic valve with fusion of the right and noncoronary cusps. There was mild aortic stenosis and moderate aortic insufficiency. The ascending aorta was mildly dilated at 3.9 cm. TTE (1/10) showed EF 60% with mild LVH, mild AS (mean gradient 12), mild to moderate AR, normal LV size. TTE (1/12) with EF 60%, mild LVH, moderate diastolic dysfunction, mild AS (mean gradient 11 mmHg), mild AI, normal RV. MRA chest (1/12) with 3.7 cm aortic root and ascending aorta. TTE (2/13): EF 60-65%, mild LVH, mild AS, mild AI, mild MR.  Echo (2/15) with EF 60-65%, bicuspid aortic valve with mild aortic stenosis and mild to moderate AI, mild MR.  MRA chest (2/15) with 4.1 cm aortic root and 4.1 cm ascending aorta.  Echo (4/16) with EF 60-65%, mild AS mean gradient 16 mmHg.   - TEE (7/17) with EF 60-65%, bicuspid aortic valve with mild to moderate AS, moderate AI.  6. Biceps tendon rupture.  7. CKD 8. Ascending aortic aneurysm: TEE (2/17) with 4.0 cm ascending aorta.   Family History:  The patient's mother has a history of stroke, also has questionable history of atrial fibrillation. The patient's father had an MI in his 85s.   Social  History:  The patient is married, lives in [place], is a nonsmoker. He owns several businesses. He is not drinking alcohol. He works out with a Clinical research associate a couple of days a week and gets a significant amount of cardiovascular exercise.   Review of Systems  All systems reviewed and negative except as per HPI.   Current Outpatient Prescriptions  Medication Sig Dispense Refill  . arginine 500 MG tablet Take 500 mg by mouth daily. Cardio  for Life supplement    . aspirin EC 325 MG tablet Take 1 tablet (325 mg total) by mouth daily. 30 tablet 0  . atorvastatin (LIPITOR) 40 MG tablet TAKE ONE TABLET BY MOUTH ONCE DAILY 90 tablet 3  . benazepril-hydrochlorthiazide (LOTENSIN HCT) 20-12.5 MG tablet Take 2 tablets by mouth daily. 180 tablet 3  . clopidogrel (PLAVIX) 75 MG tablet Take 1 tablet (75 mg total) by mouth daily. 90 tablet 3  . metoprolol tartrate (LOPRESSOR) 25 MG tablet Take 25 mg by mouth daily as needed (PALPITATIONS).    Marland Kitchen omeprazole (PRILOSEC) 40 MG capsule Take 1 capsule (40 mg total) by mouth daily. 90 capsule 3   No current facility-administered medications for this visit.     BP 132/80   Pulse 72   Ht 5\' 11"  (1.803 m)   Wt 219 lb (99.3 kg)   BMI 30.54 kg/m  General: NAD Neck: No JVD, no thyromegaly or thyroid nodule.  Lungs: Clear to auscultation bilaterally with normal respiratory effort. CV: Nondisplaced PMI.  Heart regular S1/S2, no S3/S4, 2/6 SEM RUSB (S2 heard clearly).  No peripheral edema.  No carotid bruit.  Normal peripheral pulses.   Abdomen: Soft, nontender, no hepatosplenomegaly, no distention.   Neurologic: Alert and oriented x 3.  Psych: Normal affect. Extremities: No clubbing or cyanosis.   Assessment/Plan: 1. Bicuspid aortic valve disorder: Mild to moderate AS and moderate AI on 7/17 TEE.  Mild dilation of ascending aorta on 2/17 TEE => 4.0 cm.  - I will arrange for echo and MRA chest in 7/18.   2. CAD: Moderate nonobstructive disease on prior cath.  He is on a statin and ASA. 3. Hyperlipidemia: Good lipids 3/17, continue current statin.  4. HTN: Reasonable control on current meds.   5. Atrial fibrillation: Paroxysmal atrial fibrillation seen by [facility] monitor.  He is symptomatic when in atrial fibrillation, but episodes recently have been rare.   - He has a Multimedia programmer.  - If atrial fibrillation episodes become more frequent, he would be a reasonable dronedarone candidate.  Would avoid  Ic agent with moderate CAD.    I will see him in 1 year at the Heart and Vascular clinic.   [name] 04/21/2016

## 2016-04-22 NOTE — Addendum Note (Signed)
Addended by: Marlis Edelson C on: 04/22/2016 10:27 AM   Modules accepted: Orders

## 2016-04-26 LAB — CUP PACEART REMOTE DEVICE CHECK
MDC IDC PG IMPLANT DT: 20160115
MDC IDC SESS DTM: 20171007023552

## 2016-04-26 NOTE — Progress Notes (Signed)
Carelink summary report received. Battery status OK. Normal device function. No new symptom episodes, tachy episodes, brady, or pause episodes. 1 AF episode, SR with PAC's.  Monthly summary reports and ROV/PRN

## 2016-04-27 ENCOUNTER — Encounter: Payer: Self-pay | Admitting: Family Medicine

## 2016-05-19 ENCOUNTER — Ambulatory Visit (INDEPENDENT_AMBULATORY_CARE_PROVIDER_SITE_OTHER): Payer: Medicare Other | Admitting: *Deleted

## 2016-05-19 DIAGNOSIS — I480 Paroxysmal atrial fibrillation: Secondary | ICD-10-CM | POA: Diagnosis not present

## 2016-05-20 NOTE — Progress Notes (Signed)
Carelink Summary Report / Loop Recorder 

## 2016-06-04 LAB — CUP PACEART REMOTE DEVICE CHECK
Date Time Interrogation Session: 20171106033719
MDC IDC PG IMPLANT DT: 20160115

## 2016-06-04 NOTE — Progress Notes (Signed)
Carelink summary report received. Battery status OK. Normal device function. No new symptom episodes, tachy episodes, brady, or pause episodes. 1 AF 0%- ECG appears artifact. Monthly summary reports and ROV/PRN

## 2016-06-12 ENCOUNTER — Telehealth: Payer: Self-pay | Admitting: Family Medicine

## 2016-06-12 ENCOUNTER — Encounter: Payer: Self-pay | Admitting: *Deleted

## 2016-06-12 NOTE — Telephone Encounter (Signed)
Patient has stopped taking a couple of his medications. Please call him, they need to be discounted on his chart.

## 2016-06-12 NOTE — Telephone Encounter (Signed)
Tried calling NA and unable to leave a message. MyChart message sent to pt. Pt needs office visit to discuss changes to his medications. He has not seen Dr. Anitra Lauth since February of 2016 for routine f/u.

## 2016-06-18 ENCOUNTER — Ambulatory Visit (INDEPENDENT_AMBULATORY_CARE_PROVIDER_SITE_OTHER): Payer: Medicare Other | Admitting: *Deleted

## 2016-06-18 DIAGNOSIS — I480 Paroxysmal atrial fibrillation: Secondary | ICD-10-CM | POA: Diagnosis not present

## 2016-06-18 NOTE — Telephone Encounter (Signed)
My chart message read.

## 2016-06-19 ENCOUNTER — Telehealth: Payer: Self-pay | Admitting: *Deleted

## 2016-06-19 NOTE — Progress Notes (Signed)
Carelink Summary Report / Loop Recorder 

## 2016-06-19 NOTE — Telephone Encounter (Signed)
Patient called, concerned about his ILR.  Patient is concerned that he has not received regular calls regarding his ILR results.  Advised that we do not typically call patients unless there are episodes or abnormalities.  Advised he can call the [facility] if he ever has questions about monitor results.  Patient verbalizes understanding.  Transmissions are up to date through today.  Patient also states he has felt a short "vibration" near his ILR site.  He denies chest discomfort or changes at the site.  He reports it felt like his phone vibrating in his pocket.  Advise patient that ILR cannot vibrate.  Asked if it could be a muscle spasm and patient is unsure.  Advised to follow-up with PCP if the sensation continues as it is unrelated to his ILR.  Patient verbalizes understanding.  He denies additional questions or concerns at this time.

## 2016-07-05 LAB — CUP PACEART REMOTE DEVICE CHECK
Date Time Interrogation Session: 20171206043727
MDC IDC PG IMPLANT DT: 20160115

## 2016-07-05 NOTE — Progress Notes (Signed)
Carelink summary report received. Battery status OK. Normal device function. No new symptom episodes, tachy episodes, brady, or pause episodes. No new AF episodes. Monthly summary reports and ROV/PRN 

## 2016-07-08 DIAGNOSIS — L570 Actinic keratosis: Secondary | ICD-10-CM | POA: Diagnosis not present

## 2016-07-20 ENCOUNTER — Ambulatory Visit (INDEPENDENT_AMBULATORY_CARE_PROVIDER_SITE_OTHER): Payer: Medicare Other | Admitting: *Deleted

## 2016-07-20 DIAGNOSIS — I480 Paroxysmal atrial fibrillation: Secondary | ICD-10-CM | POA: Diagnosis not present

## 2016-07-21 NOTE — Progress Notes (Signed)
Carelink Summary Report / Loop Recorder 

## 2016-08-01 LAB — CUP PACEART REMOTE DEVICE CHECK
MDC IDC PG IMPLANT DT: 20160115
MDC IDC SESS DTM: 20180105194253

## 2016-08-01 NOTE — Progress Notes (Signed)
Carelink summary report received. Battery status OK. Normal device function. No new symptom episodes, tachy episodes, brady, or pause episodes. No new AF episodes. Monthly summary reports and ROV/PRN 

## 2016-08-15 LAB — CUP PACEART REMOTE DEVICE CHECK
MDC IDC PG IMPLANT DT: 20160115
MDC IDC SESS DTM: 20180204044315

## 2016-08-15 NOTE — Progress Notes (Signed)
Carelink summary report received. Battery status OK. Normal device function. No new symptom episodes, tachy episodes, brady, or pause episodes. No new AF episodes. Monthly summary reports and ROV/PRN 

## 2016-08-17 ENCOUNTER — Ambulatory Visit (INDEPENDENT_AMBULATORY_CARE_PROVIDER_SITE_OTHER): Payer: Medicare Other | Admitting: *Deleted

## 2016-08-17 DIAGNOSIS — I480 Paroxysmal atrial fibrillation: Secondary | ICD-10-CM | POA: Diagnosis not present

## 2016-08-18 NOTE — Progress Notes (Signed)
Carelink Summary Report / Loop Recorder 

## 2016-08-25 DIAGNOSIS — L570 Actinic keratosis: Secondary | ICD-10-CM | POA: Diagnosis not present

## 2016-08-28 ENCOUNTER — Other Ambulatory Visit: Payer: Self-pay | Admitting: Family Medicine

## 2016-08-28 NOTE — Telephone Encounter (Signed)
[facility].  RF request for atorvastatin LOV: 08/06/14 Next ov: None Last written: 08/30/15 #90 w/ 3RF  RF request for omeprazole Last written:  08/30/15 #90 w/ 3RF  Will send Rx's for 90 day supply. Pt needs office visit for more refills. Pt advised and voiced understanding.  Apt made for 09/17/16 at 8:15am.

## 2016-09-07 ENCOUNTER — Telehealth: Payer: Self-pay | Admitting: Family Medicine

## 2016-09-07 NOTE — Telephone Encounter (Signed)
Patient came and spoke with [place] wanting to switch appointment and primary care over to [place] at [place]. Just wanted to double check with you sir.

## 2016-09-07 NOTE — Telephone Encounter (Signed)
This is fine with me   

## 2016-09-16 ENCOUNTER — Ambulatory Visit (INDEPENDENT_AMBULATORY_CARE_PROVIDER_SITE_OTHER): Payer: Medicare Other | Admitting: *Deleted

## 2016-09-16 ENCOUNTER — Telehealth: Payer: Self-pay | Admitting: *Deleted

## 2016-09-16 DIAGNOSIS — I480 Paroxysmal atrial fibrillation: Secondary | ICD-10-CM | POA: Diagnosis not present

## 2016-09-16 NOTE — Telephone Encounter (Signed)
PreVisit Call completed. Left VM requesting return call and to schedule AWV.

## 2016-09-16 NOTE — Telephone Encounter (Signed)
Patient called you back to speak with you further about voicemail. Please call patient back. Okay to leave a detailed message on phone.

## 2016-09-16 NOTE — Telephone Encounter (Addendum)
Scheduled AWV.

## 2016-09-16 NOTE — Progress Notes (Signed)
Pre visit review using our clinic review tool, if applicable. No additional management support is needed unless otherwise documented below in the visit note. 

## 2016-09-16 NOTE — Progress Notes (Deleted)
Subjective:   Samuel Willis is a 71 y.o. male who presents for Medicare Annual/Subsequent preventive examination.  Review of Systems:  No ROS.  Medicare Wellness Visit.   Sleep patterns: Home Safety/Smoke Alarms:   Living environment; residence and Firearm Safety: Seat Belt Safety/Bike Helmet: Wears seat belt.   Counseling:   Eye Exam-  Dental-  Male:   CCS-  06/16/2002   PSA-  Lab Results  Component Value Date   PSA 2.01 08/23/2015   PSA 1.44 10/19/2011   PSA 1.31 11/24/2010        Objective:    Vitals: BP 128/82   Pulse 74   Temp 98.3 F (36.8 C) (Oral)   Ht 5\' 11"  (1.803 m)   Wt 223 lb 6.4 oz (101.3 kg)   SpO2 97%   BMI 31.16 kg/m   Body mass index is 31.16 kg/m.  Tobacco History  Smoking Status  . Former Smoker  . Quit date: 11/03/1993  Smokeless Tobacco  . Never Used     Counseling given: Not Answered   Past Medical History:  Diagnosis Date  . Aortic aneurysm, thoracic (HCC)    bicuspid aortic valve. aortic stenosis, aortic regurg  . Asbestos exposure CT 06/2013   Noncalcified pleural plaques bilat; no signs of malignancy.  . Bicuspid aortic valve    aortic stenosis, aortic regurgitation; Dr. [name] plans to get an echo and MRA chest 12/2016  . CAD (coronary artery disease)    LAD dz but no corresponding ischemia on myoview  . Chronic renal insufficiency, stage III (moderate) 06/2013   CrCl about 50 ml/min  . Confusion summer 2017   Eval by Dr. Erlinda Hong, neurology---suspected dx is impaired concentration/focus, but EEG being done to further r/o seizures.  . Fatty liver 04/2012; 06/2013   Noted on noncontrast abd CT done during trauma w/u when tractor rolled onto his knee.  Also noted on noncontrast chest CT done to screen for asbestos lung damage.  Marland Kitchen GERD (gastroesophageal reflux disease)   . H/O hiatal hernia   . Hyperlipidemia   . Hypertension   . Nephrolithiasis   . Paroxysmal atrial fibrillation (HCC)    ASA 325 + plavix as of 01/06/16;   electrophys did placement of Watchman left atrial appendage occlusive device 10/2015.  Marland Kitchen Prediabetes 2015   A1c 6.4%.  A1c 6.1% 08/2015   Past Surgical History:  Procedure Laterality Date  . CARDIAC CATHETERIZATION  06/24/10  . CIRCUMCISION  01/18/2012   Procedure: CIRCUMCISION ADULT;  Surgeon: [name], MD;  Location: [facility];  Service: Urology;  Laterality: N/A;  30 mins requested for this case   . COLONOSCOPY    . EEG  01/2016   NORMAL ([facility])  . ESOPHAGOGASTRODUODENOSCOPY  02/23/2011   Barrett's esophagus, hiatal hernia - dilated for dysphagia  . Implantable loop recorder placement  06/29/14   MDT LINQ implanted by Dr [name] in the office as part of the RIO II protocol. + A fib confirmation.  Marland Kitchen LEFT ATRIAL APPENDAGE OCCLUSION N/A 10/31/2015   Procedure: LEFT ATRIAL APPENDAGE OCCLUSION;  Surgeon: [name], MD;  Location: [facility];  Service: Cardiovascular;  Laterality: N/A;  . SHOULDER SURGERY     Arthroscopic 02/23/12  . TEE WITHOUT CARDIOVERSION N/A 07/25/2015   Dr. [name]: EF 60-65%, normal LV wall motion, functionally bicuspid aortic valve with mild AI and mild AS, ascending aorta dilated to 4 cm, left atrial appendage measured for Watchman procedure/device  . TEE  WITHOUT CARDIOVERSION N/A 12/27/2015   Procedure: TRANSESOPHAGEAL ECHOCARDIOGRAM (TEE);  Surgeon: Dorothy Spark, MD;  Location: [facility];  Service: Cardiovascular;  Laterality: N/A;  . TENDON REPAIR  11/06/2011   Procedure: TENDON REPAIR;  Surgeon: Cammie Sickle., MD;  Location: [facility];  Service: Orthopedics;  Laterality: Left;  explore/repair tendons left hand   . WATCHMAN PLACEMENT  10/31/15   Left atrial appendage occlusive device    Family History  Problem Relation Age of Onset  . Arthritis Mother   . Diabetes Mother   . Stroke Mother     In her 26s  . Heart attack Father 34  . Asthma Father   . Emphysema Father   . Heart disease Father    . Hyperlipidemia Neg Hx   . Hypertension Neg Hx    History  Sexual Activity  . Sexual activity: Yes    Outpatient Encounter Prescriptions as of 09/17/2016  Medication Sig  . aspirin EC 325 MG tablet Take 1 tablet (325 mg total) by mouth daily.  Marland Kitchen atorvastatin (LIPITOR) 40 MG tablet TAKE ONE TABLET BY MOUTH ONCE DAILY  . omeprazole (PRILOSEC) 40 MG capsule TAKE ONE CAPSULE BY MOUTH ONCE DAILY  . [DISCONTINUED] benazepril-hydrochlorthiazide (LOTENSIN HCT) 20-12.5 MG tablet Take 2 tablets by mouth daily.  . [DISCONTINUED] metoprolol tartrate (LOPRESSOR) 25 MG tablet Take 25 mg by mouth daily as needed (PALPITATIONS).  . [DISCONTINUED] arginine 500 MG tablet Take 500 mg by mouth daily. Cardio for Life supplement  . [DISCONTINUED] clopidogrel (PLAVIX) 75 MG tablet Take 1 tablet (75 mg total) by mouth daily. (Patient not taking: Reported on 09/17/2016)   No facility-administered encounter medications on file as of 09/17/2016.     Activities of Daily Living In your present state of health, do you have any difficulty performing the following activities: 10/31/2015 10/31/2015  Hearing? - N  Vision? - N  Difficulty concentrating or making decisions? - N  Walking or climbing stairs? - N  Dressing or bathing? - N  Doing errands, shopping? N -  Some recent data might be hidden    Patient Care Team: Briscoe Deutscher, DO as PCP - General (Family Medicine) [name], MD as Consulting Physician (Cardiology) [name], MD as Consulting Physician (Cardiology) Rosalin Hawking, MD as Consulting Physician (Neurology)   Assessment:    Physical assessment deferred to PCP.  Exercise Activities and Dietary recommendations   Diet (meal preparation, eat out, water intake, caffeinated beverages, dairy products, fruits and vegetables): Breakfast: Lunch:  Dinner:      Goals    None     Fall Risk Fall Risk  09/17/2016 08/26/2015  Falls in the past year? No No   Depression Screen PHQ 2/9 Scores  09/17/2016 08/26/2015  PHQ - 2 Score 0 0    Cognitive Function        Immunization History  Administered Date(s) Administered  . Influenza Split 04/19/2012  . Influenza, High Dose Seasonal PF 06/26/2013, 08/26/2015  . Pneumococcal Conjugate-13 08/06/2014  . Pneumococcal Polysaccharide-23 11/24/2010  . Zoster 10/08/2015   Screening Tests Health Maintenance  Topic Date Due  . Hepatitis C Screening  [birth year]  . COLONOSCOPY  06/16/2012  . PNA vac Low Risk Adult (2 of 2 - PPSV23) 11/24/2015  . INFLUENZA VACCINE  01/13/2017  . TETANUS/TDAP  08/15/2023      Plan:    During the course of the visit the patient was educated and counseled about the following appropriate screening and preventive  services:   Vaccines to include Pneumoccal, Influenza, Hepatitis B, Td, Zostavax, HCV  Cardiovascular Disease  Colorectal cancer screening  Diabetes screening  Prostate Cancer Screening  Glaucoma screening  Nutrition counseling   Smoking cessation counseling  Patient Instructions (the written plan) was given to the patient.    [place], [place]  09/17/2016

## 2016-09-17 ENCOUNTER — Ambulatory Visit: Payer: Medicare Other | Admitting: Family Medicine

## 2016-09-17 ENCOUNTER — Encounter: Payer: Self-pay | Admitting: Family Medicine

## 2016-09-17 ENCOUNTER — Ambulatory Visit (INDEPENDENT_AMBULATORY_CARE_PROVIDER_SITE_OTHER): Payer: Medicare Other | Admitting: Family Medicine

## 2016-09-17 VITALS — BP 128/82 | HR 74 | Temp 98.3°F | Ht 71.0 in | Wt 223.4 lb

## 2016-09-17 DIAGNOSIS — E785 Hyperlipidemia, unspecified: Secondary | ICD-10-CM | POA: Diagnosis not present

## 2016-09-17 DIAGNOSIS — E119 Type 2 diabetes mellitus without complications: Secondary | ICD-10-CM

## 2016-09-17 DIAGNOSIS — I10 Essential (primary) hypertension: Secondary | ICD-10-CM

## 2016-09-17 DIAGNOSIS — E669 Obesity, unspecified: Secondary | ICD-10-CM

## 2016-09-17 DIAGNOSIS — K219 Gastro-esophageal reflux disease without esophagitis: Secondary | ICD-10-CM | POA: Diagnosis not present

## 2016-09-17 DIAGNOSIS — R7301 Impaired fasting glucose: Secondary | ICD-10-CM

## 2016-09-17 DIAGNOSIS — I480 Paroxysmal atrial fibrillation: Secondary | ICD-10-CM | POA: Diagnosis not present

## 2016-09-17 LAB — CBC
HCT: 46.4 % (ref 39.0–52.0)
Hemoglobin: 15.7 g/dL (ref 13.0–17.0)
MCHC: 33.9 g/dL (ref 30.0–36.0)
MCV: 92.6 fl (ref 78.0–100.0)
Platelets: 198 10*3/uL (ref 150.0–400.0)
RBC: 5.01 Mil/uL (ref 4.22–5.81)
RDW: 13.5 % (ref 11.5–15.5)
WBC: 5.5 10*3/uL (ref 4.0–10.5)

## 2016-09-17 LAB — LIPID PANEL
Cholesterol: 141 mg/dL (ref 0–200)
HDL: 37 mg/dL — ABNORMAL LOW (ref >39.00)
LDL Cholesterol: 92 mg/dL (ref 0–99)
NonHDL: 104.38
Total CHOL/HDL Ratio: 4
Triglycerides: 64 mg/dL (ref 0.0–149.0)
VLDL: 12.8 mg/dL (ref 0.0–40.0)

## 2016-09-17 LAB — COMPREHENSIVE METABOLIC PANEL
ALT: 34 U/L (ref 0–53)
AST: 23 U/L (ref 0–37)
Albumin: 4.4 g/dL (ref 3.5–5.2)
Alkaline Phosphatase: 50 U/L (ref 39–117)
BUN: 23 mg/dL (ref 6–23)
CO2: 31 mEq/L (ref 19–32)
Calcium: 9.6 mg/dL (ref 8.4–10.5)
Chloride: 103 mEq/L (ref 96–112)
Creatinine, Ser: 1.32 mg/dL (ref 0.40–1.50)
GFR: 56.97 mL/min — ABNORMAL LOW (ref >60.00)
Glucose, Bld: 115 mg/dL — ABNORMAL HIGH (ref 70–99)
Potassium: 4.6 mEq/L (ref 3.5–5.1)
Sodium: 139 mEq/L (ref 135–145)
Total Bilirubin: 0.7 mg/dL (ref 0.2–1.2)
Total Protein: 6.8 g/dL (ref 6.0–8.3)

## 2016-09-17 LAB — HEMOGLOBIN A1C: Hgb A1c MFr Bld: 6.9 % — ABNORMAL HIGH (ref 4.6–6.5)

## 2016-09-17 MED ORDER — OMEPRAZOLE 40 MG PO CPDR: 40.0000 mg | DELAYED_RELEASE_CAPSULE | Freq: Every day | ORAL | 3 refills | Status: DC

## 2016-09-17 MED ORDER — ASPIRIN EC 325 MG PO TBEC: 325.0000 mg | DELAYED_RELEASE_TABLET | Freq: Every day | ORAL | 3 refills | Status: DC

## 2016-09-17 MED ORDER — METFORMIN HCL 500 MG PO TABS: 500.0000 mg | ORAL_TABLET | Freq: Two times a day (BID) | ORAL | 3 refills | Status: DC

## 2016-09-17 MED ORDER — ATORVASTATIN CALCIUM 40 MG PO TABS: 40.0000 mg | ORAL_TABLET | Freq: Every day | ORAL | 3 refills | Status: DC

## 2016-09-17 NOTE — Progress Notes (Signed)
Samuel Willis is a 71 y.o. male is here to [facility].   History of Present Illness:  Water quality scientist, CMA, acting as scribe for Dr. Juleen China.  CC: Patient comes in today to establish care.  No complaints.  States he needs refills on his medications today.  Would like 90-day supply.  HPI:  1. Hyperlipidemia, unspecified hyperlipidemia type.  Rx Lipitor. No myalgias.  Lab Results  Component Value Date   CHOL 141 09/17/2016   HDL 37.00 (L) 09/17/2016   LDLCALC 92 09/17/2016   TRIG 64.0 09/17/2016   CHOLHDL 4 09/17/2016    2. Essential hypertension. Home blood pressure readings at goal. Avoiding excessive salt intake. Denies chest pain. Taking medications as prescribed without side effects.   Wt Readings from Last 3 Encounters:  09/17/16 223 lb 6.4 oz (101.3 kg)  04/20/16 219 lb (99.3 kg)  01/09/16 212 lb 9.6 oz (96.4 kg)   BP Readings from Last 3 Encounters:  09/17/16 128/82  04/20/16 132/80  01/09/16 (!) 122/91   Lab Results  Component Value Date   CREATININE 1.32 09/17/2016    3. Paroxysmal atrial fibrillation ([place]). No episodes in several months. Hx of Watchman placement 10/2015.    4. Impaired fasting glucose. Due for A1c.   5. Gastroesophageal reflux disease, esophagitis presence not specified. Needs refill of medication.   Health Maintenance Due  Topic Date Due  . Hepatitis C Screening  [birth year]  . COLONOSCOPY  06/16/2012  . PNA vac Low Risk Adult (2 of 2 - PPSV23) 11/24/2015    PMHx, SurgHx, SocialHx, Medications, and Allergies were reviewed in the Visit Navigator and updated as appropriate.   Patient Active Problem List   Diagnosis Date Noted  . Obesity (BMI 30.0-34.9) 09/17/2016  . MCI (mild cognitive impairment) 01/09/2016  . Bicuspid aortic valve   . Aortic insufficiency   . Aortic stenosis   . Ascending aortic aneurysm ([place])   . Paroxysmal atrial fibrillation ([place]) 10/31/2015  . Impaired fasting glucose 06/26/2013  . Asbestos exposure  06/26/2013  . Contusion of right knee 04/26/2012  . CAD (coronary artery disease) 07/26/2011  . Barrett's esophagus 02/23/2011  . GERD (gastroesophageal reflux disease) 01/06/2011  . Esophageal dysphagia 11/30/2010  . Aneurysm of thoracic aorta ([place]) 03/20/2010  . Hyperlipidemia 07/16/2008  . Essential hypertension 07/16/2008   Family History  Problem Relation Age of Onset  . Arthritis Mother   . Diabetes Mother   . Stroke Mother     In her 10s  . Heart attack Father 81  . Asthma Father   . Emphysema Father   . Heart disease Father   . Hyperlipidemia Neg Hx   . Hypertension Neg Hx    Social History  Substance Use Topics  . Smoking status: Former Smoker    Quit date: 11/03/1993  . Smokeless tobacco: Never Used  . Alcohol use No   Current Medications and Allergies:   .  aspirin EC 325 MG tablet, Take 1 tablet (325 mg total) by mouth daily., Disp: 90 tablet, Rfl: 3 .  atorvastatin (LIPITOR) 40 MG tablet, Take 1 tablet (40 mg total) by mouth daily., Disp: 90 tablet, Rfl: 3 .  omeprazole (PRILOSEC) 40 MG capsule, Take 1 capsule (40 mg total) by mouth daily., Disp: 90 capsule, Rfl: 3  No Known Allergies   Review of Systems:   Review of Systems  Constitutional: Negative for chills and fever.  HENT: Negative for ear pain, sinus pain and sore throat.   Eyes:  Negative for blurred vision.  Respiratory: Negative for cough and shortness of breath.   Cardiovascular: Negative for chest pain and palpitations.  Gastrointestinal: Negative for abdominal pain, nausea and vomiting.  Genitourinary: Negative for frequency.  Musculoskeletal: Negative for back pain.  Skin: Negative for rash.  Neurological: Negative for loss of consciousness and headaches.  Psychiatric/Behavioral: Negative for depression. The patient is not nervous/anxious.     Vitals:   Vitals:   09/17/16 0829  BP: 128/82  Pulse: 74  Temp: 98.3 F (36.8 C)  TempSrc: Oral  SpO2: 97%  Weight: 223 lb 6.4 oz (101.3  kg)  Height: 5\' 11"  (1.803 m)     Body mass index is 31.16 kg/m.   Physical Exam:   Physical Exam  Constitutional: He is oriented to person, place, and time. He appears well-developed and well-nourished. No distress.  HENT:  Head: Normocephalic and atraumatic.  Right Ear: External ear normal.  Left Ear: External ear normal.  Nose: Nose normal.  Mouth/Throat: Oropharynx is clear and moist.  Eyes: Conjunctivae and EOM are normal. Pupils are equal, round, and reactive to light.  Neck: Normal range of motion. Neck supple.  Cardiovascular: Normal rate, regular rhythm, normal heart sounds and intact distal pulses.   Pulmonary/Chest: Effort normal and breath sounds normal.  Abdominal: Soft. Bowel sounds are normal.  Musculoskeletal: Normal range of motion.  Neurological: He is alert and oriented to person, place, and time.  Skin: Skin is warm and dry.  Psychiatric: He has a normal mood and affect. His behavior is normal. Judgment and thought content normal.  Nursing note and vitals reviewed.    Assessment and Plan:    Isac was seen today for establish care and medication refill.  Diagnoses and all orders for this visit:  Hyperlipidemia, unspecified hyperlipidemia type -     atorvastatin (LIPITOR) 40 MG tablet; Take 1 tablet (40 mg total) by mouth daily. -     aspirin EC 325 MG tablet; Take 1 tablet (325 mg total) by mouth daily. -     Comprehensive metabolic panel -     Lipid panel  Essential hypertension  Paroxysmal atrial fibrillation (HCC) -     CBC  Impaired fasting glucose -     Hemoglobin A1c  Gastroesophageal reflux disease, esophagitis presence not specified -     omeprazole (PRILOSEC) 40 MG capsule; Take 1 capsule (40 mg total) by mouth daily.    . Reviewed expectations re: course of current medical issues. . Discussed self-management of symptoms. . Outlined signs and symptoms indicating need for more acute intervention. . Patient verbalized understanding  and all questions were answered. . See orders for this visit as documented in the electronic medical record. . Patient received an After Visit Summary.  Records requested if needed. I spent 45 minutes with this patient, greater than 50% was face-to-face time counseling regarding the above diagnoses.  CMA served as Education administrator during this visit. History, Physical, and Plan performed by medical provider. Documentation and orders reviewed and attested to. Briscoe Deutscher, D.O.  Briscoe Deutscher, [place], [place] 09/17/2016   Follow-up: Return in about 1 year (around 09/17/2017).  Meds ordered this encounter  Medications  . omeprazole (PRILOSEC) 40 MG capsule    Sig: Take 1 capsule (40 mg total) by mouth daily.    Dispense:  90 capsule    Refill:  3  . atorvastatin (LIPITOR) 40 MG tablet    Sig: Take 1 tablet (40 mg total) by mouth daily.  Dispense:  90 tablet    Refill:  3    Needs office visit for more refills.  Marland Kitchen aspirin EC 325 MG tablet    Sig: Take 1 tablet (325 mg total) by mouth daily.    Dispense:  90 tablet    Refill:  3   Medications Discontinued During This Encounter  Medication Reason  . clopidogrel (PLAVIX) 75 MG tablet Error  . benazepril-hydrochlorthiazide (LOTENSIN HCT) 20-12.5 MG tablet Error  . metoprolol tartrate (LOPRESSOR) 25 MG tablet Error  . arginine 500 MG tablet Error  . omeprazole (PRILOSEC) 40 MG capsule Reorder  . atorvastatin (LIPITOR) 40 MG tablet Reorder  . aspirin EC 325 MG tablet Reorder   Orders Placed This Encounter  Procedures  . Hemoglobin A1c  . Comprehensive metabolic panel  . CBC  . Lipid panel

## 2016-09-17 NOTE — Progress Notes (Signed)
Addendum with labs.  Results for orders placed or performed in visit on 09/17/16  Hemoglobin A1c  Result Value Ref Range   Hgb A1c MFr Bld 6.9 (H) 4.6 - 6.5 %  Comprehensive metabolic panel  Result Value Ref Range   Sodium 139 135 - 145 mEq/L   Potassium 4.6 3.5 - 5.1 mEq/L   Chloride 103 96 - 112 mEq/L   CO2 31 19 - 32 mEq/L   Glucose, Bld 115 (H) 70 - 99 mg/dL   BUN 23 6 - 23 mg/dL   Creatinine, Ser 1.32 0.40 - 1.50 mg/dL   Total Bilirubin 0.7 0.2 - 1.2 mg/dL   Alkaline Phosphatase 50 39 - 117 U/L   AST 23 0 - 37 U/L   ALT 34 0 - 53 U/L   Total Protein 6.8 6.0 - 8.3 g/dL   Albumin 4.4 3.5 - 5.2 g/dL   Calcium 9.6 8.4 - 10.5 mg/dL   GFR 56.97 (L) >60.00 mL/min  CBC  Result Value Ref Range   WBC 5.5 4.0 - 10.5 K/uL   RBC 5.01 4.22 - 5.81 Mil/uL   Platelets 198.0 150.0 - 400.0 K/uL   Hemoglobin 15.7 13.0 - 17.0 g/dL   HCT 46.4 39.0 - 52.0 %   MCV 92.6 78.0 - 100.0 fl   MCHC 33.9 30.0 - 36.0 g/dL   RDW 13.5 11.5 - 15.5 %  Lipid panel  Result Value Ref Range   Cholesterol 141 0 - 200 mg/dL   Triglycerides 64.0 0.0 - 149.0 mg/dL   HDL 37.00 (L) >39.00 mg/dL   VLDL 12.8 0.0 - 40.0 mg/dL   LDL Cholesterol 92 0 - 99 mg/dL   Total CHOL/HDL Ratio 4    NonHDL 104.38     Type 2 diabetes mellitus without complication, without long-term current use of insulin (HCC) -     metFORMIN (GLUCOPHAGE) 500 MG tablet; Take 1 tablet (500 mg total) by mouth 2 (two) times daily with a meal.  Briscoe Deutscher, D.O. [place], [facility]

## 2016-09-17 NOTE — Progress Notes (Signed)
Carelink Summary Report / Loop Recorder 

## 2016-09-17 NOTE — Progress Notes (Signed)
Pre visit review using our clinic review tool, if applicable. No additional management support is needed unless otherwise documented below in the visit note. 

## 2016-09-18 LAB — CUP PACEART REMOTE DEVICE CHECK
Implantable Pulse Generator Implant Date: 20160115
MDC IDC SESS DTM: 20180306050810

## 2016-09-20 NOTE — Addendum Note (Signed)
Addended by: Briscoe Deutscher R on: 09/20/2016 04:36 PM   Modules accepted: Orders

## 2016-09-25 LAB — CUP PACEART REMOTE DEVICE CHECK
Date Time Interrogation Session: 20180405053657
Implantable Pulse Generator Implant Date: 20160115

## 2016-10-01 ENCOUNTER — Telehealth (HOSPITAL_COMMUNITY): Payer: Self-pay | Admitting: Radiology

## 2016-10-01 ENCOUNTER — Telehealth: Payer: Self-pay | Admitting: Cardiology

## 2016-10-01 NOTE — Telephone Encounter (Signed)
LMOVM requesting that pt send manual transmission b/c home monitor has not updated in at least 14 days.    

## 2016-10-01 NOTE — Telephone Encounter (Signed)
Called to schedule an echocardiogram 

## 2016-10-08 ENCOUNTER — Encounter: Payer: Self-pay | Admitting: Cardiology

## 2016-10-16 ENCOUNTER — Ambulatory Visit (INDEPENDENT_AMBULATORY_CARE_PROVIDER_SITE_OTHER): Payer: Medicare Other | Admitting: *Deleted

## 2016-10-16 DIAGNOSIS — I480 Paroxysmal atrial fibrillation: Secondary | ICD-10-CM | POA: Diagnosis not present

## 2016-10-19 ENCOUNTER — Other Ambulatory Visit: Payer: Self-pay | Admitting: *Deleted

## 2016-10-19 DIAGNOSIS — Q231 Congenital insufficiency of aortic valve: Secondary | ICD-10-CM

## 2016-10-19 DIAGNOSIS — I712 Thoracic aortic aneurysm, without rupture, unspecified: Secondary | ICD-10-CM

## 2016-10-19 NOTE — Progress Notes (Signed)
Carelink Summary Report / Loop Recorder 

## 2016-10-23 ENCOUNTER — Telehealth: Payer: Self-pay | Admitting: *Deleted

## 2016-10-23 NOTE — Telephone Encounter (Signed)
-----   Message from [name], MD sent at 10/19/2016  7:34 PM EDT ----- Yes, he should hold.  ----- Message ----- From: Katrine Coho, RN Sent: 10/19/2016  11:03 AM To: [name], MD  He is having an MRA of chest 11/18/16. Does he need to hold metformin for MRA?  Thanks

## 2016-10-23 NOTE — Telephone Encounter (Signed)
LMTCB to let pt know he should hold metformin for MRA.

## 2016-10-26 NOTE — Telephone Encounter (Signed)
LMTCB

## 2016-10-27 NOTE — Telephone Encounter (Signed)
Pt states he has taken benazepril-HCT 20/12.5 daily for years, it is not on his medication list, I will update medication list to include this .

## 2016-10-27 NOTE — Telephone Encounter (Signed)
Pt states he has never taken metformin, he was going to try to lose weight, medication list updated.

## 2016-10-29 LAB — CUP PACEART REMOTE DEVICE CHECK
Implantable Pulse Generator Implant Date: 20160115
MDC IDC SESS DTM: 20180505060848

## 2016-11-02 ENCOUNTER — Telehealth (HOSPITAL_COMMUNITY): Payer: Self-pay | Admitting: Cardiology

## 2016-11-06 NOTE — Telephone Encounter (Signed)
  11/02/2016 10:30 AM Phone (Outgoing) [name], [name] (Self) [phone] (H)   Left Message - Returned patient call and lmsg for him to CB to sch echo.     By Verdene Rio

## 2016-11-10 ENCOUNTER — Encounter: Payer: Self-pay | Admitting: Internal Medicine

## 2016-11-12 ENCOUNTER — Telehealth: Payer: Self-pay

## 2016-11-13 ENCOUNTER — Other Ambulatory Visit: Payer: Medicare Other

## 2016-11-16 ENCOUNTER — Ambulatory Visit (INDEPENDENT_AMBULATORY_CARE_PROVIDER_SITE_OTHER): Payer: Medicare Other | Admitting: *Deleted

## 2016-11-16 DIAGNOSIS — I480 Paroxysmal atrial fibrillation: Secondary | ICD-10-CM

## 2016-11-16 NOTE — Progress Notes (Signed)
Carelink Summary Report / Loop Recorder 

## 2016-11-18 ENCOUNTER — Ambulatory Visit (HOSPITAL_COMMUNITY)
Admission: RE | Admit: 2016-11-18 | Discharge: 2016-11-18 | Disposition: A | Discharge status: Home / Self Care | Payer: Medicare Other | Source: Ambulatory Visit | Attending: Cardiology | Admitting: Cardiology

## 2016-11-18 DIAGNOSIS — Q231 Congenital insufficiency of aortic valve: Secondary | ICD-10-CM | POA: Insufficient documentation

## 2016-11-18 DIAGNOSIS — I350 Nonrheumatic aortic (valve) stenosis: Secondary | ICD-10-CM | POA: Diagnosis not present

## 2016-11-18 DIAGNOSIS — I712 Thoracic aortic aneurysm, without rupture, unspecified: Secondary | ICD-10-CM

## 2016-11-18 DIAGNOSIS — I359 Nonrheumatic aortic valve disorder, unspecified: Secondary | ICD-10-CM

## 2016-11-18 DIAGNOSIS — I77810 Thoracic aortic ectasia: Secondary | ICD-10-CM | POA: Insufficient documentation

## 2016-11-18 DIAGNOSIS — I7121 Aneurysm of the ascending aorta, without rupture: Secondary | ICD-10-CM

## 2016-11-18 HISTORY — DX: Thoracic aortic aneurysm, without rupture, unspecified: I71.20

## 2016-11-18 HISTORY — DX: Thoracic aortic aneurysm, without rupture: I71.2

## 2016-11-18 LAB — CUP PACEART REMOTE DEVICE CHECK
Date Time Interrogation Session: 20180604060841
Implantable Pulse Generator Implant Date: 20160115

## 2016-11-18 LAB — CREATININE, SERUM
CREATININE: 1.27 mg/dL — AB (ref 0.61–1.24)
GFR calc Af Amer: >60 mL/min (ref >60)
GFR calc non Af Amer: 56 mL/min — ABNORMAL LOW (ref >60)

## 2016-11-18 MED ORDER — GADOBENATE DIMEGLUMINE 529 MG/ML IV SOLN
20.0000 mL | Freq: Once | INTRAVENOUS | Status: AC | PRN
  Administered 2016-11-18: 20 mL via INTRAVENOUS

## 2016-11-18 NOTE — Telephone Encounter (Signed)
Spoke with patient regarding his c/o dizziness and low HR. In conversation patient stated that every time he has a dizzy spell he checks his HR/BP and they are within normal range. He continued by saying that he feels he has structural disease and is having an MRI today with an echo in the next week or so. I reviewed his last ILR transmission which showed no brady events. He stated that he would prefer to follow up with Dr. Kirk Ruths as he feels like his dizzy spells are not related to his HR.

## 2016-11-30 ENCOUNTER — Ambulatory Visit (HOSPITAL_COMMUNITY): Payer: Medicare Other | Attending: Cardiology

## 2016-11-30 ENCOUNTER — Other Ambulatory Visit: Payer: Self-pay

## 2016-11-30 DIAGNOSIS — I352 Nonrheumatic aortic (valve) stenosis with insufficiency: Secondary | ICD-10-CM | POA: Diagnosis not present

## 2016-11-30 DIAGNOSIS — I359 Nonrheumatic aortic valve disorder, unspecified: Secondary | ICD-10-CM

## 2016-11-30 DIAGNOSIS — E119 Type 2 diabetes mellitus without complications: Secondary | ICD-10-CM | POA: Diagnosis not present

## 2016-11-30 DIAGNOSIS — I2510 Atherosclerotic heart disease of native coronary artery without angina pectoris: Secondary | ICD-10-CM | POA: Insufficient documentation

## 2016-11-30 DIAGNOSIS — I348 Other nonrheumatic mitral valve disorders: Secondary | ICD-10-CM | POA: Diagnosis not present

## 2016-11-30 DIAGNOSIS — I119 Hypertensive heart disease without heart failure: Secondary | ICD-10-CM | POA: Diagnosis not present

## 2016-11-30 DIAGNOSIS — I7121 Aneurysm of the ascending aorta, without rupture: Secondary | ICD-10-CM

## 2016-11-30 DIAGNOSIS — I77810 Thoracic aortic ectasia: Secondary | ICD-10-CM | POA: Insufficient documentation

## 2016-11-30 DIAGNOSIS — I4891 Unspecified atrial fibrillation: Secondary | ICD-10-CM | POA: Insufficient documentation

## 2016-11-30 DIAGNOSIS — E785 Hyperlipidemia, unspecified: Secondary | ICD-10-CM | POA: Insufficient documentation

## 2016-11-30 DIAGNOSIS — I712 Thoracic aortic aneurysm, without rupture: Secondary | ICD-10-CM

## 2016-11-30 DIAGNOSIS — I350 Nonrheumatic aortic (valve) stenosis: Secondary | ICD-10-CM | POA: Diagnosis not present

## 2016-12-03 ENCOUNTER — Telehealth (HOSPITAL_COMMUNITY): Payer: Self-pay | Admitting: *Deleted

## 2016-12-03 NOTE — Telephone Encounter (Signed)
   ECHOCARDIOGRAM COMPLETE  Order: [id number]  Status:  Final result Visible to patient:  Yes (MyChart) Dx:  Aortic valve disorder; Ascending aort...  Notes recorded by Kennieth Rad, RN on 12/03/2016 at 4:56 PM EDT Called and spoke with patient. He disagrees with the results because he feels that something is wrong and he is still having dizzy spells. He wants to discuss results further with Dr. [name] and requested for an appointment. He was fine with next available, appt scheduled. ------  Notes recorded by [name], MD on 11/30/2016 at 3:57 PM EDT Normal EF, mild to moderate aortic stenosis. No significant change from prior study. Please report to patient.  Details

## 2016-12-15 DIAGNOSIS — H43393 Other vitreous opacities, bilateral: Secondary | ICD-10-CM | POA: Diagnosis not present

## 2016-12-17 ENCOUNTER — Ambulatory Visit (INDEPENDENT_AMBULATORY_CARE_PROVIDER_SITE_OTHER): Payer: Medicare Other | Admitting: *Deleted

## 2016-12-17 DIAGNOSIS — I480 Paroxysmal atrial fibrillation: Secondary | ICD-10-CM | POA: Diagnosis not present

## 2016-12-18 NOTE — Progress Notes (Signed)
Carelink Summary Report / Loop Recorder 

## 2016-12-21 LAB — CUP PACEART REMOTE DEVICE CHECK
Date Time Interrogation Session: 20180704134125
Implantable Pulse Generator Implant Date: 20160115

## 2016-12-30 ENCOUNTER — Other Ambulatory Visit: Payer: Self-pay | Admitting: Family Medicine

## 2017-01-10 ENCOUNTER — Other Ambulatory Visit: Payer: Self-pay | Admitting: Family Medicine

## 2017-01-11 ENCOUNTER — Other Ambulatory Visit: Payer: Self-pay

## 2017-01-11 ENCOUNTER — Telehealth: Payer: Self-pay | Admitting: Family Medicine

## 2017-01-11 DIAGNOSIS — D225 Melanocytic nevi of trunk: Secondary | ICD-10-CM | POA: Diagnosis not present

## 2017-01-11 DIAGNOSIS — L821 Other seborrheic keratosis: Secondary | ICD-10-CM | POA: Diagnosis not present

## 2017-01-11 DIAGNOSIS — Z0000 Encounter for general adult medical examination without abnormal findings: Secondary | ICD-10-CM

## 2017-01-11 DIAGNOSIS — L570 Actinic keratosis: Secondary | ICD-10-CM | POA: Diagnosis not present

## 2017-01-11 DIAGNOSIS — B070 Plantar wart: Secondary | ICD-10-CM | POA: Diagnosis not present

## 2017-01-11 DIAGNOSIS — Z125 Encounter for screening for malignant neoplasm of prostate: Secondary | ICD-10-CM

## 2017-01-11 DIAGNOSIS — E119 Type 2 diabetes mellitus without complications: Secondary | ICD-10-CM

## 2017-01-11 MED ORDER — BENAZEPRIL-HYDROCHLOROTHIAZIDE 20-12.5 MG PO TABS: 1.0000 | ORAL_TABLET | Freq: Every day | ORAL | 0 refills | Status: DC

## 2017-01-11 NOTE — Telephone Encounter (Signed)
**  Remind patient they can make refill requests via MyChart**  Medication refill request (Name & Dosage): benazepril-hydrochlorthiazide (LOTENSIN HCT) 20-12.5 MG tablet   Preferred pharmacy (Name & Address):  [place], [place] - [address] [phone] (Phone) [phone] (Fax)      Other comments (if applicable):

## 2017-01-11 NOTE — Telephone Encounter (Signed)
Rx sent to pharmacy   

## 2017-01-13 ENCOUNTER — Encounter: Payer: Self-pay | Admitting: Internal Medicine

## 2017-01-13 ENCOUNTER — Ambulatory Visit (INDEPENDENT_AMBULATORY_CARE_PROVIDER_SITE_OTHER): Payer: Medicare Other | Admitting: Internal Medicine

## 2017-01-13 ENCOUNTER — Telehealth: Payer: Self-pay | Admitting: Cardiology

## 2017-01-13 VITALS — BP 132/78 | HR 60 | Ht 71.0 in | Wt 194.0 lb

## 2017-01-13 DIAGNOSIS — R0789 Other chest pain: Secondary | ICD-10-CM | POA: Diagnosis not present

## 2017-01-13 DIAGNOSIS — Q231 Congenital insufficiency of aortic valve: Secondary | ICD-10-CM | POA: Diagnosis not present

## 2017-01-13 DIAGNOSIS — I480 Paroxysmal atrial fibrillation: Secondary | ICD-10-CM | POA: Diagnosis not present

## 2017-01-13 DIAGNOSIS — R079 Chest pain, unspecified: Secondary | ICD-10-CM

## 2017-01-13 DIAGNOSIS — I359 Nonrheumatic aortic valve disorder, unspecified: Secondary | ICD-10-CM

## 2017-01-13 LAB — CUP PACEART INCLINIC DEVICE CHECK
Date Time Interrogation Session: 20180801141247
MDC IDC PG IMPLANT DT: 20160115

## 2017-01-13 MED ORDER — BENAZEPRIL HCL 20 MG PO TABS: 20.0000 mg | ORAL_TABLET | Freq: Every day | ORAL | 3 refills | Status: DC

## 2017-01-13 NOTE — Patient Instructions (Addendum)
Medication Instructions:  STOP Lotensin/HCT START Lotensin (Benazepril) 20 mg once daily   Labwork: None Ordered   Testing/Procedures: Your physician has requested that you have an exercise stress myoview. For further information please visit [email or web address]. Please follow instruction sheet, as given.   Follow-Up: Your physician recommends that you schedule a follow-up appointment in: as needed with Dr. [name]   If you need a refill on your cardiac medications before your next appointment, please call your pharmacy.   Thank you for choosing [facility]! Christen Bame, RN [phone]

## 2017-01-13 NOTE — Telephone Encounter (Signed)
LMOVM requesting that pt send manual transmission b/c home monitor has not updated in at least 14 days.    

## 2017-01-13 NOTE — Progress Notes (Signed)
PCP: Briscoe Deutscher, DO Primary Cardiologist: Dr [name] Primary EP: Dr Murvin Natal is a 71 y.o. male who presents today for routine electrophysiology followup.  Since last being seen in our clinic, the patient reports doing very well.  He has rare afib for which he is not aware.  He has recently noticed discomfort in his L chest wall.  he finds this concerning.  Today, he denies symptoms of palpitations, chest pain, shortness of breath,  lower extremity edema, dizziness, presyncope, or syncope.  The patient is otherwise without complaint today.   Past Medical History:  Diagnosis Date  . Aortic aneurysm, thoracic (HCC)    bicuspid aortic valve. aortic stenosis, aortic regurg  . Asbestos exposure CT 06/2013   Noncalcified pleural plaques bilat; no signs of malignancy.  . Bicuspid aortic valve    aortic stenosis, aortic regurgitation; Dr. [name] plans to get an echo and MRA chest 12/2016  . CAD (coronary artery disease)    LAD dz but no corresponding ischemia on myoview  . Chronic renal insufficiency, stage III (moderate) 06/2013   CrCl about 50 ml/min  . Confusion summer 2017   Eval by Dr. Erlinda Hong, neurology---suspected dx is impaired concentration/focus, but EEG being done to further r/o seizures.  . Fatty liver 04/2012; 06/2013   Noted on noncontrast abd CT done during trauma w/u when tractor rolled onto his knee.  Also noted on noncontrast chest CT done to screen for asbestos lung damage.  Marland Kitchen GERD (gastroesophageal reflux disease)   . H/O hiatal hernia   . Hyperlipidemia   . Hypertension   . Nephrolithiasis   . Paroxysmal atrial fibrillation (HCC)    ASA 325 + plavix as of 01/06/16;  electrophys did placement of Watchman left atrial appendage occlusive device 10/2015.  Marland Kitchen Prediabetes 2015   A1c 6.4%.  A1c 6.1% 08/2015   Past Surgical History:  Procedure Laterality Date  . CARDIAC CATHETERIZATION  06/24/10  . CIRCUMCISION  01/18/2012   Procedure: CIRCUMCISION ADULT;  Surgeon: [name], MD;  Location: [facility];  Service: Urology;  Laterality: N/A;  30 mins requested for this case   . COLONOSCOPY    . EEG  01/2016   NORMAL ([place] [facility])  . ESOPHAGOGASTRODUODENOSCOPY  02/23/2011   Barrett's esophagus, hiatal hernia - dilated for dysphagia  . Implantable loop recorder placement  06/29/14   MDT LINQ implanted by Dr [name] in the office as part of the RIO II protocol. + A fib confirmation.  Marland Kitchen LEFT ATRIAL APPENDAGE OCCLUSION N/A 10/31/2015   Procedure: LEFT ATRIAL APPENDAGE OCCLUSION;  Surgeon: [name], MD;  Location: [place] [facility];  Service: Cardiovascular;  Laterality: N/A;  . SHOULDER SURGERY     Arthroscopic 02/23/12  . TEE WITHOUT CARDIOVERSION N/A 07/25/2015   Dr. [name]: EF 60-65%, normal LV wall motion, functionally bicuspid aortic valve with mild AI and mild AS, ascending aorta dilated to 4 cm, left atrial appendage measured for Watchman procedure/device  . TEE WITHOUT CARDIOVERSION N/A 12/27/2015   Procedure: TRANSESOPHAGEAL ECHOCARDIOGRAM (TEE);  Surgeon: Dorothy Spark, MD;  Location: [facility] ENDOSCOPY;  Service: Cardiovascular;  Laterality: N/A;  . TENDON REPAIR  11/06/2011   Procedure: TENDON REPAIR;  Surgeon: Cammie Sickle., MD;  Location: [place];  Service: Orthopedics;  Laterality: Left;  explore/repair tendons left hand   . WATCHMAN PLACEMENT  10/31/15   Left atrial appendage occlusive device     ROS- all systems are reviewed  and negatives except as per HPI above  Current Outpatient Prescriptions  Medication Sig Dispense Refill  . aspirin EC 325 MG tablet Take 1 tablet (325 mg total) by mouth daily. 90 tablet 3  . atorvastatin (LIPITOR) 40 MG tablet Take 1 tablet (40 mg total) by mouth daily. 90 tablet 3  . benazepril-hydrochlorthiazide (LOTENSIN HCT) 20-12.5 MG tablet Take 1 tablet by mouth daily. 30 tablet 0  . omeprazole (PRILOSEC) 40 MG capsule Take 1 capsule (40 mg total) by mouth daily. 90  capsule 3   No current facility-administered medications for this visit.     Physical Exam: Vitals:   01/13/17 1238  BP: 132/78  Pulse: 60  Weight: 194 lb (88 kg)  Height: 5\' 11"  (1.803 m)    GEN- The patient is well appearing, alert and oriented x 3 today.   Head- normocephalic, atraumatic Eyes-  Sclera clear, conjunctiva pink Ears- hearing intact Oropharynx- clear Lungs- Clear to ausculation bilaterally, normal work of breathing Heart- Regular rate and rhythm, 2/6 SEM RUSB (S2 heard today) GI- soft, NT, ND, + BS Extremities- no clubbing, cyanosis, or edema  ILR interrogation ordered today is personally reviewed and shows sinus rhythm,  Rare and short afib  Assessment and Plan:  1. afib Doing well s/p watchman Low afib burden Noncompliant with ASA.  I have instructed him to take ASA 325mg  daily  2. HTN Stable No change required today  3. Bicuspid AV Followed by Dr [name]  4. Chest pain Both atypical and typical features Recent echo reviewed Exercise myoview is ordered  He will follow-up with Dr [name]  carelink Return as needed  [name] MD, [facility] 01/13/2017 12:51 PM

## 2017-01-14 ENCOUNTER — Telehealth (HOSPITAL_COMMUNITY): Payer: Self-pay | Admitting: *Deleted

## 2017-01-14 NOTE — Telephone Encounter (Signed)
Left message on voicemail per DPR in reference to upcoming appointment scheduled on 01/21/17 with detailed instructions given per Myocardial Perfusion Study Information Sheet for the test. LM to arrive 15 minutes early, and that it is imperative to arrive on time for appointment to keep from having the test rescheduled. If you need to cancel or reschedule your appointment, please call the office within 24 hours of your appointment. Failure to do so may result in a cancellation of your appointment, and a $50 no show fee. Phone number given for call back for any questions. Kirstie Peri

## 2017-01-15 ENCOUNTER — Ambulatory Visit (INDEPENDENT_AMBULATORY_CARE_PROVIDER_SITE_OTHER): Payer: Medicare Other | Admitting: *Deleted

## 2017-01-15 DIAGNOSIS — I480 Paroxysmal atrial fibrillation: Secondary | ICD-10-CM | POA: Diagnosis not present

## 2017-01-19 NOTE — Progress Notes (Signed)
Carelink Summary Report / Loop Recorder 

## 2017-01-20 ENCOUNTER — Encounter: Payer: Self-pay | Admitting: Cardiology

## 2017-01-21 ENCOUNTER — Other Ambulatory Visit: Payer: Medicare Other

## 2017-01-21 ENCOUNTER — Ambulatory Visit (HOSPITAL_COMMUNITY): Payer: Medicare Other | Attending: Cardiology

## 2017-01-21 DIAGNOSIS — R079 Chest pain, unspecified: Secondary | ICD-10-CM

## 2017-01-21 LAB — MYOCARDIAL PERFUSION IMAGING
CHL CUP NUCLEAR SSS: 1
CSEPEW: 11.7 METS
Exercise duration (min): 10 min
Exercise duration (sec): 0 s
LHR: 0.3
LV dias vol: 125 mL (ref 62–150)
LVSYSVOL: 55 mL
MPHR: 150 {beats}/min
NUC STRESS TID: 1
Peak HR: 141 {beats}/min
Percent HR: 94 %
Rest HR: 45 {beats}/min
SDS: 0
SRS: 1

## 2017-01-21 MED ORDER — TECHNETIUM TC 99M TETROFOSMIN IV KIT
32.5000 | PACK | Freq: Once | INTRAVENOUS | Status: AC | PRN
  Administered 2017-01-21: 32.5 via INTRAVENOUS
  Filled 2017-01-21: qty 33

## 2017-01-21 MED ORDER — TECHNETIUM TC 99M TETROFOSMIN IV KIT
10.3000 | PACK | Freq: Once | INTRAVENOUS | Status: AC | PRN
  Administered 2017-01-21: 10.3 via INTRAVENOUS
  Filled 2017-01-21: qty 11

## 2017-01-22 ENCOUNTER — Other Ambulatory Visit (INDEPENDENT_AMBULATORY_CARE_PROVIDER_SITE_OTHER): Payer: Medicare Other

## 2017-01-22 DIAGNOSIS — Z125 Encounter for screening for malignant neoplasm of prostate: Secondary | ICD-10-CM | POA: Diagnosis not present

## 2017-01-22 DIAGNOSIS — Z0000 Encounter for general adult medical examination without abnormal findings: Secondary | ICD-10-CM | POA: Diagnosis not present

## 2017-01-22 DIAGNOSIS — E119 Type 2 diabetes mellitus without complications: Secondary | ICD-10-CM

## 2017-01-22 LAB — COMPREHENSIVE METABOLIC PANEL
ALT: 46 U/L (ref 0–53)
AST: 26 U/L (ref 0–37)
Albumin: 4.3 g/dL (ref 3.5–5.2)
Alkaline Phosphatase: 52 U/L (ref 39–117)
BUN: 23 mg/dL (ref 6–23)
CO2: 30 mEq/L (ref 19–32)
Calcium: 9.1 mg/dL (ref 8.4–10.5)
Chloride: 110 mEq/L (ref 96–112)
Creatinine, Ser: 1.27 mg/dL (ref 0.40–1.50)
GFR: 59.51 mL/min — ABNORMAL LOW (ref >60.00)
Glucose, Bld: 116 mg/dL — ABNORMAL HIGH (ref 70–99)
Potassium: 5.1 mEq/L (ref 3.5–5.1)
Sodium: 145 mEq/L (ref 135–145)
Total Bilirubin: 0.4 mg/dL (ref 0.2–1.2)
Total Protein: 6.2 g/dL (ref 6.0–8.3)

## 2017-01-22 LAB — PSA, MEDICARE: PSA: 2.51 ng/ml (ref 0.10–4.00)

## 2017-01-22 LAB — HEMOGLOBIN A1C: Hgb A1c MFr Bld: 6 % (ref 4.6–6.5)

## 2017-01-26 LAB — CUP PACEART REMOTE DEVICE CHECK
Date Time Interrogation Session: 20180803141017
MDC IDC PG IMPLANT DT: 20160115

## 2017-01-29 ENCOUNTER — Ambulatory Visit (INDEPENDENT_AMBULATORY_CARE_PROVIDER_SITE_OTHER): Payer: Medicare Other | Admitting: Family Medicine

## 2017-01-29 ENCOUNTER — Encounter: Payer: Self-pay | Admitting: Family Medicine

## 2017-01-29 VITALS — BP 128/68 | HR 64 | Temp 97.6°F | Ht 71.0 in | Wt 189.6 lb

## 2017-01-29 DIAGNOSIS — I10 Essential (primary) hypertension: Secondary | ICD-10-CM | POA: Diagnosis not present

## 2017-01-29 DIAGNOSIS — E782 Mixed hyperlipidemia: Secondary | ICD-10-CM | POA: Diagnosis not present

## 2017-01-29 DIAGNOSIS — E119 Type 2 diabetes mellitus without complications: Secondary | ICD-10-CM | POA: Diagnosis not present

## 2017-01-29 NOTE — Progress Notes (Signed)
Samuel Willis is a 71 y.o. male is here for follow up.  History of Present Illness:   Samuel Willis CMA acting as scribe for Dr. Juleen China.  HPI: Patient comes in today for a follow up. No new problems or concerns today.   1. Diabetes mellitus without complication.  Current symptoms: no polyuria or polydipsia, no chest pain, dyspnea or TIA's, no numbness, tingling or pain in extremities.  Maintaining a diabetic diet? [x]   YES  []   NO Trying to exercise on a regular basis? [x]   YES  []   NO  On ACE inhibitor or angiotensin II receptor blocker? [x]   YES  []   NO On Aspirin? []   YES  [x]   NO  Lab Results  Component Value Date   HGBA1C 6.0 01/22/2017    No results found for: Derl Barrow   Lab Results  Component Value Date   CHOL 141 09/17/2016   HDL 37.00 (L) 09/17/2016   LDLCALC 92 09/17/2016   TRIG 64.0 09/17/2016   CHOLHDL 4 09/17/2016     Wt Readings from Last 3 Encounters:  01/29/17 189 lb 9.6 oz (86 kg)  01/13/17 194 lb (88 kg)  09/17/16 223 lb 6.4 oz (101.3 kg)   BP Readings from Last 3 Encounters:  01/29/17 128/68  01/13/17 132/78  09/17/16 128/82   Lab Results  Component Value Date   CREATININE 1.27 01/22/2017    2. Essential hypertension.   Home blood pressure readings at goal.  Avoiding excessive salt intake? [x]   YES  []   NO Trying to exercise on a regular basis? [x]   YES  []   NO Review: taking medications as instructed, no medication side effects noted, no TIAs, no chest pain on exertion, no dyspnea on exertion, no swelling of ankles.   BP Readings from Last 3 Encounters:  01/29/17 128/68  01/13/17 132/78  09/17/16 128/82    3. Mixed hyperlipidemia.   Is the patient taking medications without problems? [x]   YES  []   NO Does the patient complain of muscle aches?   []   YES  [x]    NO Trying to exercise on a regular basis? [x]   YES  []   NO Diet Compliance: compliant most of the time. Cardiovascular ROS: no chest pain or dyspnea on  exertion.   Lipids:    Component Value Date/Time   CHOL 141 09/17/2016 0929   TRIG 64.0 09/17/2016 0929   HDL 37.00 (L) 09/17/2016 0929   VLDL 12.8 09/17/2016 0929   CHOLHDL 4 09/17/2016 0929      Health Maintenance Due  Topic Date Due  . Hepatitis C Screening  1945/12/28  . FOOT EXAM  08/07/1956  . OPHTHALMOLOGY EXAM  08/07/1956  . COLONOSCOPY  06/16/2012  . PNA vac Low Risk Adult (2 of 2 - PPSV23) 11/24/2015  . INFLUENZA VACCINE  01/13/2017   Depression screen [facility] 2/9 09/17/2016 08/26/2015  Decreased Interest 0 0  Down, Depressed, Hopeless 0 0  PHQ - 2 Score 0 0   PMHx, SurgHx, SocialHx, FamHx, Medications, and Allergies were reviewed in the Visit Navigator and updated as appropriate.   Patient Active Problem List   Diagnosis Date Noted  . Obesity (BMI 30.0-34.9) 09/17/2016  . MCI (mild cognitive impairment) 01/09/2016  . Bicuspid aortic valve   . Aortic insufficiency   . Aortic stenosis   . Ascending aortic aneurysm ([place])   . Paroxysmal atrial fibrillation ([place]) 10/31/2015  . Diabetes mellitus without complication ([place]) 27/08/5007  . Asbestos exposure 06/26/2013  .  Contusion of right knee 04/26/2012  . CAD (coronary artery disease) 07/26/2011  . Barrett's esophagus 02/23/2011  . GERD (gastroesophageal reflux disease) 01/06/2011  . Esophageal dysphagia 11/30/2010  . Aneurysm of thoracic aorta ([place]) 03/20/2010  . Hyperlipidemia 07/16/2008  . Essential hypertension 07/16/2008   Social History  Substance Use Topics  . Smoking status: Former Smoker    Quit date: 11/03/1993  . Smokeless tobacco: Never Used  . Alcohol use No   Current Medications and Allergies:   .  atorvastatin (LIPITOR) 40 MG tablet, Take 1 tablet (40 mg total) by mouth daily., Disp: 90 tablet, Rfl: 3 .  benazepril (LOTENSIN) 20 MG tablet, Take 1 tablet (20 mg total) by mouth daily., Disp: 90 tablet, Rfl: 3  No Known Allergies   Review of Systems   Pertinent items are noted in the HPI.  Otherwise, ROS is negative.  Vitals:   Vitals:   01/29/17 1354  BP: 128/68  Pulse: 64  Temp: 97.6 F (36.4 C)  TempSrc: Oral  SpO2: 97%  Weight: 189 lb 9.6 oz (86 kg)  Height: 5\' 11"  (1.803 m)     Body mass index is 26.44 kg/m.   Physical Exam:   Physical Exam  Constitutional: He is oriented to person, place, and time. He appears well-developed and well-nourished. No distress.  HENT:  Head: Normocephalic and atraumatic.  Right Ear: External ear normal.  Left Ear: External ear normal.  Nose: Nose normal.  Mouth/Throat: Oropharynx is clear and moist.  Eyes: Pupils are equal, round, and reactive to light. Conjunctivae and EOM are normal.  Neck: Normal range of motion. Neck supple.  Cardiovascular: Normal rate, regular rhythm, normal heart sounds and intact distal pulses.   Pulmonary/Chest: Effort normal and breath sounds normal.  Abdominal: Soft. Bowel sounds are normal.  Musculoskeletal: Normal range of motion.  Neurological: He is alert and oriented to person, place, and time.  Skin: Skin is warm and dry.  Psychiatric: He has a normal mood and affect. His behavior is normal. Judgment and thought content normal.  Nursing note and vitals reviewed.   Results for orders placed or performed in visit on 01/22/17  Hemoglobin A1c  Result Value Ref Range   Hgb A1c MFr Bld 6.0 4.6 - 6.5 %  Comprehensive metabolic panel  Result Value Ref Range   Sodium 145 135 - 145 mEq/L   Potassium 5.1 3.5 - 5.1 mEq/L   Chloride 110 96 - 112 mEq/L   CO2 30 19 - 32 mEq/L   Glucose, Bld 116 (H) 70 - 99 mg/dL   BUN 23 6 - 23 mg/dL   Creatinine, Ser 1.27 0.40 - 1.50 mg/dL   Total Bilirubin 0.4 0.2 - 1.2 mg/dL   Alkaline Phosphatase 52 39 - 117 U/L   AST 26 0 - 37 U/L   ALT 46 0 - 53 U/L   Total Protein 6.2 6.0 - 8.3 g/dL   Albumin 4.3 3.5 - 5.2 g/dL   Calcium 9.1 8.4 - 10.5 mg/dL   GFR 59.51 (L) >60.00 mL/min  PSA, Medicare  Result Value Ref Range   PSA 2.51 0.10 - 4.00 ng/ml    Assessment and Plan:   Diagnoses and all orders for this visit:  Diabetes mellitus without complication ([place]) Comments: Doing well without medications.   Essential hypertension Comments: Doing well on current treatment.   Mixed hyperlipidemia Comments: Continue current treatment.    . Reviewed expectations re: course of current medical issues. . Discussed self-management of symptoms. Marland Kitchen  Outlined signs and symptoms indicating need for more acute intervention. . Patient verbalized understanding and all questions were answered. Marland Kitchen Health Maintenance issues including appropriate healthy diet, exercise, and smoking avoidance were discussed with patient. . See orders for this visit as documented in the electronic medical record. . Patient received an After Visit Summary.  CMA served as Education administrator during this visit. History, Physical, and Plan performed by medical provider. The above documentation has been reviewed and is accurate and complete. Briscoe Deutscher, D.O.  Briscoe Deutscher, DO [place] [place] 01/29/2017  Future Appointments Date Time Provider [place]  02/08/2017 9:00 AM [name], MD [facility] None  02/16/2017 9:55 AM [facility] DEVICE REMOTES [facility]  07/26/2017 9:15 AM [facility] LAB [facility] None  08/02/2017 9:15 AM Briscoe Deutscher, DO [facility] None

## 2017-02-03 ENCOUNTER — Encounter: Payer: Self-pay | Admitting: Internal Medicine

## 2017-02-08 ENCOUNTER — Encounter (HOSPITAL_COMMUNITY): Payer: Self-pay | Admitting: Cardiology

## 2017-02-08 ENCOUNTER — Ambulatory Visit (HOSPITAL_COMMUNITY)
Admission: RE | Admit: 2017-02-08 | Discharge: 2017-02-08 | Disposition: A | Discharge status: Home / Self Care | Payer: Medicare Other | Source: Ambulatory Visit | Attending: Cardiology | Admitting: Cardiology

## 2017-02-08 VITALS — BP 134/72 | Wt 196.0 lb

## 2017-02-08 DIAGNOSIS — E782 Mixed hyperlipidemia: Secondary | ICD-10-CM

## 2017-02-08 DIAGNOSIS — I340 Nonrheumatic mitral (valve) insufficiency: Secondary | ICD-10-CM | POA: Diagnosis not present

## 2017-02-08 DIAGNOSIS — I480 Paroxysmal atrial fibrillation: Secondary | ICD-10-CM | POA: Diagnosis not present

## 2017-02-08 DIAGNOSIS — Z79899 Other long term (current) drug therapy: Secondary | ICD-10-CM | POA: Insufficient documentation

## 2017-02-08 DIAGNOSIS — I350 Nonrheumatic aortic (valve) stenosis: Secondary | ICD-10-CM

## 2017-02-08 DIAGNOSIS — Q231 Congenital insufficiency of aortic valve: Secondary | ICD-10-CM | POA: Diagnosis not present

## 2017-02-08 DIAGNOSIS — Z7982 Long term (current) use of aspirin: Secondary | ICD-10-CM | POA: Insufficient documentation

## 2017-02-08 DIAGNOSIS — I2510 Atherosclerotic heart disease of native coronary artery without angina pectoris: Secondary | ICD-10-CM | POA: Insufficient documentation

## 2017-02-08 DIAGNOSIS — I712 Thoracic aortic aneurysm, without rupture: Secondary | ICD-10-CM | POA: Diagnosis not present

## 2017-02-08 DIAGNOSIS — Z823 Family history of stroke: Secondary | ICD-10-CM | POA: Insufficient documentation

## 2017-02-08 DIAGNOSIS — I129 Hypertensive chronic kidney disease with stage 1 through stage 4 chronic kidney disease, or unspecified chronic kidney disease: Secondary | ICD-10-CM | POA: Insufficient documentation

## 2017-02-08 DIAGNOSIS — N189 Chronic kidney disease, unspecified: Secondary | ICD-10-CM | POA: Diagnosis not present

## 2017-02-08 DIAGNOSIS — Z8249 Family history of ischemic heart disease and other diseases of the circulatory system: Secondary | ICD-10-CM | POA: Insufficient documentation

## 2017-02-08 DIAGNOSIS — E785 Hyperlipidemia, unspecified: Secondary | ICD-10-CM | POA: Diagnosis not present

## 2017-02-08 LAB — LIPID PANEL
CHOLESTEROL: 105 mg/dL (ref 0–200)
HDL: 42 mg/dL (ref >40)
LDL Cholesterol: 55 mg/dL (ref 0–99)
Total CHOL/HDL Ratio: 2.5 RATIO
Triglycerides: 41 mg/dL (ref <150)
VLDL: 8 mg/dL (ref 0–40)

## 2017-02-08 MED ORDER — ASPIRIN EC 81 MG PO TBEC: 81.0000 mg | DELAYED_RELEASE_TABLET | Freq: Every day | ORAL | 3 refills | Status: DC

## 2017-02-08 NOTE — Patient Instructions (Signed)
Labs done today  Start taking 81 mg Aspirin daily   Your physician recommends that you schedule a follow-up appointment in: 6 months

## 2017-02-08 NOTE — Progress Notes (Signed)
Patient ID: Samuel Willis, male   DOB: 1945/06/19, 71 y.o.   MRN: [id number] PCP: Dr. Anitra Lauth Cardiology: Dr. [name]  71 yo with history of bicuspid aortic valve and mild-moderate AS/mild AI on last echo in 6/18 as well as paroxysmal atrial fibrillation presents for followup. He had a Watchman device placed in 5/17. Cardiolite in 8/18 showed no evidence for ischemia/infarction.   Mr Samuel Willis had been getting periodic dizzy spells and fatigue for years.  Dr. Rayann Willis had him stop HCTZ (had been on benazepril-HCTZ), and he says that his symptoms completely resolved.  He feels very good currently.  No palpitations.  He is in NSR.  He was told he had diabetes so has lost about 30 lbs and hgbA1c is now 6 on no meds.  No chest pain.  No exertional dyspnea.  Walks + jogs about 2 miles/day.    Labs (12/10): creatinine 1.4  Labs (6/12): K 4.5, creatinine 1.2, LDL 66, HDL 48 Labs (7/14): K 4.5, creatinine 1.3, LDL 71, HDL 49 Labs (1/15): K 4.8, creatinine 1.5, LDL 58, HDL 36 Labs (2/16): K 4.5, creatinine 1.17, LDL 83, HDL 42, TSH normal Labs (3/17): LDL 72, HDL 44 Labs (5/17): K 4.1, creatinine 1.2 Labs (4/18): LDL 92, HDL 37 Labs (8/18): K 5.1, creatinine 1.27  Allergies (verified):  No Known Drug Allergies   Past Medical History:  1.  Atrial fibrillation: Paroxysmal. Patient has a brief episode of irregular heart beating every 1-2 years. One was apparently documented as atrial fibrillation years ago in [employer]. His CHADSVASC score is 70 now that CAD has been found. LINQ monitor was placed and has documented paroxysmal atrial fibrillation. - S/p Watchman placement in 5/17.  2. Hypertension.  3. Hyperlipidemia.  4. CAD: Exercise treadmill Myoview done in January 2010. The patient exercised for 10 minutes and 46 seconds. He stopped due to fatigue. EF was 56%. There was normal perfusion with no evidence for ischemia or infarction. LHC (1/12): 60-70% mLAD stenosis.  ETT-myoview was then done to assess  for ischemia in the LAD territory. Patient exercised 10', EF was 69% with no evidence for ischemia or infarction.   - ETT-Cardiolite (8/18): EF 56%, 10 minutes exercise, no evidence for ischemia/infarction (normal).  5. Aortic stenosis and aortic regurgitation (bicuspid aortic valve). The patient did have an echocardiogram done on June 26, 2008. EF was 65%. There were no regional wall motion abnormalities. There was moderate aortic regurgitation. There was mild aortic stenosis by mean gradient which was 12 mmHg. There was mild ascending aorta and aortic root dilation. There was mild mitral regurgitation, mild left atrial enlargement, and right atrial enlargement. TEE was done, confirming a functionally bicuspid aortic valve with fusion of the right and noncoronary cusps. There was mild aortic stenosis and moderate aortic insufficiency. The ascending aorta was mildly dilated at 3.9 cm. TTE (1/10) showed EF 60% with mild LVH, mild AS (mean gradient 12), mild to moderate AR, normal LV size. TTE (1/12) with EF 60%, mild LVH, moderate diastolic dysfunction, mild AS (mean gradient 11 mmHg), mild AI, normal RV. MRA chest (1/12) with 3.7 cm aortic root and ascending aorta. TTE (2/13): EF 60-65%, mild LVH, mild AS, mild AI, mild MR.  Echo (2/15) with EF 60-65%, bicuspid aortic valve with mild aortic stenosis and mild to moderate AI, mild MR.  MRA chest (2/15) with 4.1 cm aortic root and 4.1 cm ascending aorta.  Echo (4/16) with EF 60-65%, mild AS mean gradient 16 mmHg.   -  TEE (7/17) with EF 60-65%, bicuspid aortic valve with mild to moderate AS, moderate AI.  - Echo (6/18): EF 60-65%, moderate LVH, bicuspid aortic valve with mild to moderate AS (mean gradient 12, AVA 1.75 cm^2), mild AI, ascending aorta 4.1 cm.  - MRA chest (6/18): Ascending aorta 3.8 cm 6. Biceps tendon rupture.  7. CKD 8. Ascending aortic aneurysm: TEE (2/17) with 4.0 cm ascending aorta.   Family History:  The patient's mother has a history  of stroke, also has questionable history of atrial fibrillation. The patient's father had an MI in his 31s.   Social History:  The patient is married, lives in [place], is a nonsmoker. He owns several businesses. He is not drinking alcohol. He works out with a Clinical research associate a couple of days a week and gets a significant amount of cardiovascular exercise.   Review of Systems  All systems reviewed and negative except as per HPI.   Current Outpatient Prescriptions  Medication Sig Dispense Refill  . atorvastatin (LIPITOR) 40 MG tablet Take 1 tablet (40 mg total) by mouth daily. 90 tablet 3  . benazepril (LOTENSIN) 20 MG tablet Take 1 tablet (20 mg total) by mouth daily. 90 tablet 3  . aspirin EC 81 MG tablet Take 1 tablet (81 mg total) by mouth daily. 90 tablet 3   No current facility-administered medications for this encounter.     BP 134/72   Wt 196 lb (88.9 kg)   SpO2 100%   BMI 27.34 kg/m  General: NAD Neck: No JVD, no thyromegaly or thyroid nodule.  Lungs: Clear to auscultation bilaterally with normal respiratory effort. CV: Nondisplaced PMI.  Heart regular S1/S2, no S3/S4, 2/6 early SEM RUSB with clear S2.  No peripheral edema.  No carotid bruit.  Normal pedal pulses.  Abdomen: Soft, nontender, no hepatosplenomegaly, no distention.  Skin: Intact without lesions or rashes.  Neurologic: Alert and oriented x 3.  Psych: Normal affect. Extremities: No clubbing or cyanosis.  HEENT: Normal.   Assessment/Plan: 1. Bicuspid aortic valve disorder: Mild to moderate AS and mild AI on 6/18 echo. Minimal dilation of ascending aorta on 6/18 MRA (3.8 cm).   - Repeat echo in 1 year.   2. CAD: Moderate nonobstructive disease on prior cath.  Cardiolite this year was normal.  He is on a statin, needs to restart ASA 81 daily. 3. Hyperlipidemia: LDL above goal of < 70 in 4/18.  Since then, he has lost about 30 lbs.  I will repeat lipids today before increasing statin.   4. HTN: Reasonable control on  benazepril alone.   5. Atrial fibrillation: Paroxysmal atrial fibrillation seen by [facility] monitor.  He is symptomatic when in atrial fibrillation, but episodes recently have been rare.   - He has a Multimedia programmer.  - If atrial fibrillation episodes become more frequent, he would be a reasonable dronedarone candidate.  Would avoid Ic agent with moderate CAD.    Followup in 6 months.   [name] 02/08/2017

## 2017-02-09 ENCOUNTER — Encounter (HOSPITAL_COMMUNITY): Payer: Self-pay | Admitting: *Deleted

## 2017-02-16 ENCOUNTER — Ambulatory Visit (INDEPENDENT_AMBULATORY_CARE_PROVIDER_SITE_OTHER): Payer: Medicare Other | Admitting: *Deleted

## 2017-02-16 DIAGNOSIS — I4891 Unspecified atrial fibrillation: Secondary | ICD-10-CM | POA: Diagnosis not present

## 2017-02-18 NOTE — Progress Notes (Signed)
Carelink Summary Report / Loop Recorder 

## 2017-02-21 LAB — CUP PACEART REMOTE DEVICE CHECK
Date Time Interrogation Session: 20180902143948
MDC IDC PG IMPLANT DT: 20160115

## 2017-03-11 ENCOUNTER — Telehealth: Payer: Self-pay | Admitting: Internal Medicine

## 2017-03-11 MED ORDER — AMLODIPINE BESYLATE 5 MG PO TABS: 5.0000 mg | ORAL_TABLET | Freq: Every day | ORAL | 3 refills | Status: DC

## 2017-03-11 NOTE — Telephone Encounter (Signed)
Start amlodipine 5 mg daily.  Call with BP readings in 10 days.

## 2017-03-11 NOTE — Telephone Encounter (Signed)
Returned call to pt, he states he checked his BP on Saturday and it was elevated running 170's so he increased his Benazepril to 20 mg BID but it is continuing to run high and was even up to 199/96 today.  He feels his home cuff is accurate.  He denies HA, dizziness, blurry vision, pain.  He states he has no symptoms but is concerned BP is high.  Advised will send message to Dr [name] and call him back w/recommendations.

## 2017-03-11 NOTE — Telephone Encounter (Signed)
Pt aware and agreeable, rx sent in 

## 2017-03-11 NOTE — Telephone Encounter (Signed)
New message     Pt c/o BP issue: STAT if pt c/o blurred vision, one-sided weakness or slurred speech  1. What are your last 5 BP readings173/84   2. Are you having any other symptoms (ex. Dizziness, headache, blurred vision, passed out)?  No symptoms    3. What is your BP issue? He is doubling his medication and can not get it down , Dr [name] office phone is not working , needs advice ? Since he just seen [facility] in August

## 2017-03-15 ENCOUNTER — Telehealth (HOSPITAL_COMMUNITY): Payer: Self-pay | Admitting: *Deleted

## 2017-03-15 NOTE — Telephone Encounter (Signed)
Advanced Heart Failure Triage Encounter  Patient Name: Samuel Willis  Date of Call: 03/15/17  Problem: BP  Patient called stating that Dr. [name] started him on Norvasc 5 mg Once Daily last week.  He decided to increase his dose to 5 mg Twice Daily because his BP readings remained high. BP readings were:  167/79, 138/73, 180/89, 150/66, 152/75, 152/72, 168/75, and 170/85.   Plan:  Will send to Dr. [name] to review and will call patient back with his instructions.   Darron Doom, RN

## 2017-03-15 NOTE — Telephone Encounter (Signed)
Keep amlodipine at 5 mg bid and send in BP readings at this dose in a week or so.

## 2017-03-16 MED ORDER — AMLODIPINE BESYLATE 5 MG PO TABS: 5.0000 mg | ORAL_TABLET | Freq: Two times a day (BID) | ORAL | 3 refills | Status: DC

## 2017-03-16 NOTE — Telephone Encounter (Signed)
Patient is agreeable with plan and will call us back in 1 week with his BP readings.  Medication dose has been updated.

## 2017-03-17 ENCOUNTER — Ambulatory Visit: Payer: Medicare Other | Admitting: *Deleted

## 2017-03-18 NOTE — Progress Notes (Signed)
Remote not received

## 2017-04-15 ENCOUNTER — Ambulatory Visit (INDEPENDENT_AMBULATORY_CARE_PROVIDER_SITE_OTHER): Payer: Medicare Other | Admitting: *Deleted

## 2017-04-15 DIAGNOSIS — I4891 Unspecified atrial fibrillation: Secondary | ICD-10-CM

## 2017-04-16 NOTE — Progress Notes (Signed)
Carelink Summary Report / Loop Recorder 

## 2017-04-19 LAB — CUP PACEART REMOTE DEVICE CHECK
Implantable Pulse Generator Implant Date: 20160115
MDC IDC SESS DTM: 20181101163950

## 2017-05-17 ENCOUNTER — Ambulatory Visit (INDEPENDENT_AMBULATORY_CARE_PROVIDER_SITE_OTHER): Payer: Medicare Other | Admitting: *Deleted

## 2017-05-17 DIAGNOSIS — I480 Paroxysmal atrial fibrillation: Secondary | ICD-10-CM | POA: Diagnosis not present

## 2017-05-17 NOTE — Progress Notes (Signed)
Carelink Summary Report / Loop Recorder 

## 2017-05-25 LAB — CUP PACEART REMOTE DEVICE CHECK
Implantable Pulse Generator Implant Date: 20160115
MDC IDC SESS DTM: 20181201171004

## 2017-06-14 ENCOUNTER — Ambulatory Visit (INDEPENDENT_AMBULATORY_CARE_PROVIDER_SITE_OTHER): Payer: Medicare Other | Admitting: *Deleted

## 2017-06-14 DIAGNOSIS — I480 Paroxysmal atrial fibrillation: Secondary | ICD-10-CM | POA: Diagnosis not present

## 2017-06-14 NOTE — Progress Notes (Signed)
Carelink Summary Report / Loop Recorder 

## 2017-06-29 LAB — CUP PACEART REMOTE DEVICE CHECK
Implantable Pulse Generator Implant Date: 20160115
MDC IDC SESS DTM: 20181231170850

## 2017-07-12 DIAGNOSIS — L570 Actinic keratosis: Secondary | ICD-10-CM | POA: Diagnosis not present

## 2017-07-12 DIAGNOSIS — C44519 Basal cell carcinoma of skin of other part of trunk: Secondary | ICD-10-CM | POA: Diagnosis not present

## 2017-07-12 DIAGNOSIS — L821 Other seborrheic keratosis: Secondary | ICD-10-CM | POA: Diagnosis not present

## 2017-07-12 DIAGNOSIS — D485 Neoplasm of uncertain behavior of skin: Secondary | ICD-10-CM | POA: Diagnosis not present

## 2017-07-14 ENCOUNTER — Ambulatory Visit (INDEPENDENT_AMBULATORY_CARE_PROVIDER_SITE_OTHER): Payer: Medicare Other | Admitting: *Deleted

## 2017-07-14 DIAGNOSIS — I480 Paroxysmal atrial fibrillation: Secondary | ICD-10-CM

## 2017-07-15 NOTE — Progress Notes (Signed)
Carelink Summary Report / Loop Recorder 

## 2017-07-16 ENCOUNTER — Other Ambulatory Visit (HOSPITAL_COMMUNITY): Payer: Self-pay | Admitting: Cardiology

## 2017-07-23 ENCOUNTER — Telehealth: Payer: Self-pay

## 2017-07-23 DIAGNOSIS — E119 Type 2 diabetes mellitus without complications: Secondary | ICD-10-CM

## 2017-07-23 NOTE — Telephone Encounter (Signed)
Pt coming for labs 07/26/17. Please place future orders. Thank you.

## 2017-07-23 NOTE — Telephone Encounter (Signed)
Orders placed.

## 2017-07-23 NOTE — Telephone Encounter (Signed)
With CMP.

## 2017-07-23 NOTE — Telephone Encounter (Signed)
Do you want patient to have other labs than A1c.

## 2017-07-26 ENCOUNTER — Other Ambulatory Visit (INDEPENDENT_AMBULATORY_CARE_PROVIDER_SITE_OTHER): Payer: Medicare Other

## 2017-07-26 DIAGNOSIS — E119 Type 2 diabetes mellitus without complications: Secondary | ICD-10-CM | POA: Diagnosis not present

## 2017-07-26 LAB — COMPREHENSIVE METABOLIC PANEL
ALT: 25 U/L (ref 0–53)
AST: 17 U/L (ref 0–37)
Albumin: 4.2 g/dL (ref 3.5–5.2)
Alkaline Phosphatase: 57 U/L (ref 39–117)
BUN: 20 mg/dL (ref 6–23)
CO2: 29 mEq/L (ref 19–32)
Calcium: 9.1 mg/dL (ref 8.4–10.5)
Chloride: 105 mEq/L (ref 96–112)
Creatinine, Ser: 1.04 mg/dL (ref 0.40–1.50)
GFR: 74.83 mL/min (ref >60.00)
Glucose, Bld: 113 mg/dL — ABNORMAL HIGH (ref 70–99)
Potassium: 4.7 mEq/L (ref 3.5–5.1)
Sodium: 141 mEq/L (ref 135–145)
Total Bilirubin: 0.7 mg/dL (ref 0.2–1.2)
Total Protein: 6.5 g/dL (ref 6.0–8.3)

## 2017-07-26 LAB — HEMOGLOBIN A1C: Hgb A1c MFr Bld: 6 % (ref 4.6–6.5)

## 2017-07-27 LAB — CUP PACEART REMOTE DEVICE CHECK
Implantable Pulse Generator Implant Date: 20160115
MDC IDC SESS DTM: 20190130174043

## 2017-08-02 ENCOUNTER — Ambulatory Visit: Payer: Medicare Other | Admitting: Family Medicine

## 2017-08-05 ENCOUNTER — Ambulatory Visit (INDEPENDENT_AMBULATORY_CARE_PROVIDER_SITE_OTHER): Payer: Medicare Other | Admitting: Family Medicine

## 2017-08-05 ENCOUNTER — Encounter: Payer: Self-pay | Admitting: Family Medicine

## 2017-08-05 ENCOUNTER — Encounter: Payer: Self-pay | Admitting: Internal Medicine

## 2017-08-05 VITALS — BP 150/78 | HR 70 | Temp 98.3°F | Wt 209.4 lb

## 2017-08-05 DIAGNOSIS — Z1159 Encounter for screening for other viral diseases: Secondary | ICD-10-CM

## 2017-08-05 DIAGNOSIS — Q231 Congenital insufficiency of aortic valve: Secondary | ICD-10-CM

## 2017-08-05 DIAGNOSIS — E785 Hyperlipidemia, unspecified: Secondary | ICD-10-CM | POA: Diagnosis not present

## 2017-08-05 DIAGNOSIS — M25561 Pain in right knee: Secondary | ICD-10-CM | POA: Diagnosis not present

## 2017-08-05 DIAGNOSIS — Z1211 Encounter for screening for malignant neoplasm of colon: Secondary | ICD-10-CM | POA: Diagnosis not present

## 2017-08-05 DIAGNOSIS — G8929 Other chronic pain: Secondary | ICD-10-CM | POA: Diagnosis not present

## 2017-08-05 DIAGNOSIS — E119 Type 2 diabetes mellitus without complications: Secondary | ICD-10-CM | POA: Diagnosis not present

## 2017-08-05 MED ORDER — BENAZEPRIL HCL 20 MG PO TABS: 20.0000 mg | ORAL_TABLET | Freq: Every day | ORAL | 3 refills | Status: DC

## 2017-08-05 MED ORDER — ATORVASTATIN CALCIUM 40 MG PO TABS: 40.0000 mg | ORAL_TABLET | Freq: Every day | ORAL | 3 refills | Status: DC

## 2017-08-05 MED ORDER — METFORMIN HCL ER 500 MG PO TB24: 1000.0000 mg | ORAL_TABLET | Freq: Every day | ORAL | 3 refills | Status: DC

## 2017-08-05 MED ORDER — AMLODIPINE BESYLATE 10 MG PO TABS: 10.0000 mg | ORAL_TABLET | Freq: Every day | ORAL | 3 refills | Status: DC

## 2017-08-05 NOTE — Progress Notes (Signed)
Samuel Willis is a 72 y.o. male is here for follow up.  History of Present Illness:   HPI: See Assessment and Plan section for Problem Based Charting of issues discussed today.  Health Maintenance Due  Topic Date Due  . Hepatitis C Screening  [birth year]  . FOOT EXAM  08/07/1956  . OPHTHALMOLOGY EXAM  08/07/1956  . COLONOSCOPY  06/16/2012  . PNA vac Low Risk Adult (2 of 2 - PPSV23) 11/24/2015   Depression screen PHQ 2/9 09/17/2016 08/26/2015  Decreased Interest 0 0  Down, Depressed, Hopeless 0 0  PHQ - 2 Score 0 0   PMHx, SurgHx, SocialHx, FamHx, Medications, and Allergies were reviewed in the Visit Navigator and updated as appropriate.   Patient Active Problem List   Diagnosis Date Noted  . MCI (mild cognitive impairment) 01/09/2016  . Bicuspid aortic valve   . Aortic insufficiency   . Aortic stenosis   . Ascending aortic aneurysm ([place])   . Paroxysmal atrial fibrillation ([place]) 10/31/2015  . Diabetes mellitus without complication ([place]) 70/35/0093  . Asbestos exposure 06/26/2013  . Contusion of right knee 04/26/2012  . CAD (coronary artery disease) 07/26/2011  . Barrett's esophagus 02/23/2011  . GERD (gastroesophageal reflux disease) 01/06/2011  . Esophageal dysphagia 11/30/2010  . Aneurysm of thoracic aorta ([place]) 03/20/2010  . Hyperlipidemia 07/16/2008  . Essential hypertension 07/16/2008   Social History   Tobacco Use  . Smoking status: Former Smoker    Last attempt to quit: 11/03/1993    Years since quitting: 23.7  . Smokeless tobacco: Never Used  Substance Use Topics  . Alcohol use: No  . Drug use: No   Current Medications and Allergies:   .  amLODipine (NORVASC) 5 MG tablet, TAKE 1 TABLET BY MOUTH TWICE DAILY, Disp: 180 tablet, Rfl: 1 .  aspirin EC 81 MG tablet, Take 1 tablet (81 mg total) by mouth daily., Disp: 90 tablet, Rfl: 3 .  atorvastatin (LIPITOR) 40 MG tablet, Take 1 tablet (40 mg total) by mouth daily., Disp: 90 tablet, Rfl: 3 .  benazepril  (LOTENSIN) 20 MG tablet, Take 1 tablet (20 mg total) by mouth daily., Disp: 90 tablet, Rfl: 3  No Known Allergies   Review of Systems   Pertinent items are noted in the HPI. Otherwise, ROS is negative.  Vitals:   Vitals:   08/05/17 0922  BP: (!) 150/78  Pulse: 70  Temp: 98.3 F (36.8 C)  TempSrc: Oral  SpO2: 97%  Weight: 209 lb 6.4 oz (95 kg)     Body mass index is 29.21 kg/m.  Physical Exam:   Physical Exam  Constitutional: He is oriented to person, place, and time. He appears well-developed and well-nourished. No distress.  HENT:  Head: Normocephalic and atraumatic.  Right Ear: External ear normal.  Left Ear: External ear normal.  Nose: Nose normal.  Mouth/Throat: Oropharynx is clear and moist.  Eyes: Conjunctivae and EOM are normal. Pupils are equal, round, and reactive to light.  Neck: Normal range of motion. Neck supple.  Cardiovascular: Normal rate, regular rhythm and intact distal pulses.  Murmur heard.  Systolic murmur is present. Pulmonary/Chest: Effort normal and breath sounds normal.  Abdominal: Soft. Bowel sounds are normal.  Musculoskeletal: Normal range of motion.  Neurological: He is alert and oriented to person, place, and time.  Skin: Skin is warm and dry.  Psychiatric: He has a normal mood and affect. His behavior is normal. Judgment and thought content normal.  Nursing note and vitals reviewed.  Diabetic Foot Exam - Simple   Simple Foot Form Diabetic Foot exam was performed with the following findings:  Yes 08/05/2017 10:02 AM  Visual Inspection No deformities, no ulcerations, no other skin breakdown bilaterally:  Yes Sensation Testing Intact to touch and monofilament testing bilaterally:  Yes Pulse Check Posterior Tibialis and Dorsalis pulse intact bilaterally:  Yes Comments     Results for orders placed or performed in visit on 07/26/17  Comprehensive metabolic panel  Result Value Ref Range   Sodium 141 135 - 145 mEq/L   Potassium 4.7  3.5 - 5.1 mEq/L   Chloride 105 96 - 112 mEq/L   CO2 29 19 - 32 mEq/L   Glucose, Bld 113 (H) 70 - 99 mg/dL   BUN 20 6 - 23 mg/dL   Creatinine, Ser 1.04 0.40 - 1.50 mg/dL   Total Bilirubin 0.7 0.2 - 1.2 mg/dL   Alkaline Phosphatase 57 39 - 117 U/L   AST 17 0 - 37 U/L   ALT 25 0 - 53 U/L   Total Protein 6.5 6.0 - 8.3 g/dL   Albumin 4.2 3.5 - 5.2 g/dL   Calcium 9.1 8.4 - 10.5 mg/dL   GFR 74.83 >60.00 mL/min  Hemoglobin A1c  Result Value Ref Range   Hgb A1c MFr Bld 6.0 4.6 - 6.5 %   Lab Results  Component Value Date   CHOL 105 02/08/2017   HDL 42 02/08/2017   LDLCALC 55 02/08/2017   TRIG 41 02/08/2017   CHOLHDL 2.5 02/08/2017   Assessment and Plan:   1. Hyperlipidemia, unspecified hyperlipidemia type Trying to exercise on a regular basis? [x]   YES  []   NO Diet Compliance: compliant most of the time. Cardiovascular ROS: no chest pain or dyspnea on exertion.   Lipids:    Component Value Date/Time   CHOL 105 02/08/2017 1002   TRIG 41 02/08/2017 1002   HDL 42 02/08/2017 1002   VLDL 8 02/08/2017 1002   CHOLHDL 2.5 02/08/2017 1002   - atorvastatin (LIPITOR) 40 MG tablet; Take 1 tablet (40 mg total) by mouth daily.  Dispense: 90 tablet; Refill: 3  2. Diabetes mellitus without complication ([place]) Current symptoms: no polyuria or polydipsia, no chest pain, dyspnea or TIA's, no numbness, tingling or pain in extremities.  Maintaining a diabetic diet? []   YES  [x]   NO Trying to exercise on a regular basis? [x]   YES  []   NO  On ACE inhibitor or angiotensin II receptor blocker? [x]   YES  []   NO On Aspirin? [x]   YES  []   NO  Lab Results  Component Value Date   HGBA1C 6.0 07/26/2017    No results found for: Derl Barrow   Lab Results  Component Value Date   CHOL 105 02/08/2017   HDL 42 02/08/2017   LDLCALC 55 02/08/2017   TRIG 41 02/08/2017   CHOLHDL 2.5 02/08/2017     Wt Readings from Last 3 Encounters:  08/05/17 209 lb 6.4 oz (95 kg)  02/08/17 196 lb (88.9 kg)   01/29/17 189 lb 9.6 oz (86 kg)   BP Readings from Last 3 Encounters:  08/05/17 (!) 150/78  02/08/17 134/72  01/29/17 128/68   Lab Results  Component Value Date   CREATININE 1.04 07/26/2017   - Comprehensive metabolic panel; Future - Lipid panel; Future - Hemoglobin A1c; Future  He is open to starting Metformin at this time.   3. Encounter for hepatitis C screening test for low risk patient - Hepatitis  C antibody; Future  4. Screening for colon cancer - Ambulatory referral to Gastroenterology  5. Chronic pain of right knee Ongoing. Worse with going up stairs. No weakness. Pain is manageable at this time. No intervention requested at this time.   6. Bicuspid aortic valve - Ambulatory referral to Cardiology for follow up.   . Reviewed expectations re: course of current medical issues. . Discussed self-management of symptoms. . Outlined signs and symptoms indicating need for more acute intervention. . Patient verbalized understanding and all questions were answered. Marland Kitchen Health Maintenance issues including appropriate healthy diet, exercise, and smoking avoidance were discussed with patient. . See orders for this visit as documented in the electronic medical record. . Patient received an After Visit Summary.  Briscoe Deutscher, DO [place], [place] 08/05/2017

## 2017-08-07 ENCOUNTER — Encounter: Payer: Self-pay | Admitting: Family Medicine

## 2017-08-16 ENCOUNTER — Ambulatory Visit (INDEPENDENT_AMBULATORY_CARE_PROVIDER_SITE_OTHER): Payer: Medicare Other | Admitting: *Deleted

## 2017-08-16 DIAGNOSIS — I480 Paroxysmal atrial fibrillation: Secondary | ICD-10-CM

## 2017-08-17 NOTE — Progress Notes (Signed)
Carelink Summary Report / Loop Recorder 

## 2017-08-24 ENCOUNTER — Other Ambulatory Visit: Payer: Self-pay

## 2017-08-24 ENCOUNTER — Encounter (HOSPITAL_COMMUNITY): Payer: Self-pay | Admitting: Cardiology

## 2017-08-24 ENCOUNTER — Ambulatory Visit (HOSPITAL_COMMUNITY)
Admission: RE | Admit: 2017-08-24 | Discharge: 2017-08-24 | Disposition: A | Discharge status: Home / Self Care | Payer: Medicare Other | Source: Ambulatory Visit | Attending: Cardiology | Admitting: Cardiology

## 2017-08-24 VITALS — BP 130/76 | HR 66 | Wt 210.2 lb

## 2017-08-24 DIAGNOSIS — Z7984 Long term (current) use of oral hypoglycemic drugs: Secondary | ICD-10-CM | POA: Insufficient documentation

## 2017-08-24 DIAGNOSIS — E1122 Type 2 diabetes mellitus with diabetic chronic kidney disease: Secondary | ICD-10-CM | POA: Insufficient documentation

## 2017-08-24 DIAGNOSIS — I517 Cardiomegaly: Secondary | ICD-10-CM | POA: Diagnosis not present

## 2017-08-24 DIAGNOSIS — N189 Chronic kidney disease, unspecified: Secondary | ICD-10-CM | POA: Insufficient documentation

## 2017-08-24 DIAGNOSIS — I129 Hypertensive chronic kidney disease with stage 1 through stage 4 chronic kidney disease, or unspecified chronic kidney disease: Secondary | ICD-10-CM | POA: Insufficient documentation

## 2017-08-24 DIAGNOSIS — I350 Nonrheumatic aortic (valve) stenosis: Secondary | ICD-10-CM

## 2017-08-24 DIAGNOSIS — E785 Hyperlipidemia, unspecified: Secondary | ICD-10-CM | POA: Insufficient documentation

## 2017-08-24 DIAGNOSIS — Z79899 Other long term (current) drug therapy: Secondary | ICD-10-CM | POA: Diagnosis not present

## 2017-08-24 DIAGNOSIS — I352 Nonrheumatic aortic (valve) stenosis with insufficiency: Secondary | ICD-10-CM | POA: Insufficient documentation

## 2017-08-24 DIAGNOSIS — I712 Thoracic aortic aneurysm, without rupture: Secondary | ICD-10-CM

## 2017-08-24 DIAGNOSIS — Z7982 Long term (current) use of aspirin: Secondary | ICD-10-CM | POA: Insufficient documentation

## 2017-08-24 DIAGNOSIS — I480 Paroxysmal atrial fibrillation: Secondary | ICD-10-CM

## 2017-08-24 DIAGNOSIS — I7121 Aneurysm of the ascending aorta, without rupture: Secondary | ICD-10-CM

## 2017-08-24 DIAGNOSIS — I2510 Atherosclerotic heart disease of native coronary artery without angina pectoris: Secondary | ICD-10-CM | POA: Diagnosis not present

## 2017-08-24 DIAGNOSIS — I10 Essential (primary) hypertension: Secondary | ICD-10-CM

## 2017-08-24 LAB — BASIC METABOLIC PANEL
Anion gap: 10 (ref 5–15)
BUN: 16 mg/dL (ref 6–20)
CALCIUM: 9.1 mg/dL (ref 8.9–10.3)
CO2: 24 mmol/L (ref 22–32)
CREATININE: 1.08 mg/dL (ref 0.61–1.24)
Chloride: 107 mmol/L (ref 101–111)
GFR calc Af Amer: >60 mL/min (ref >60)
GLUCOSE: 98 mg/dL (ref 65–99)
Potassium: 4.1 mmol/L (ref 3.5–5.1)
Sodium: 141 mmol/L (ref 135–145)

## 2017-08-24 MED ORDER — BENAZEPRIL HCL 40 MG PO TABS: 40.0000 mg | ORAL_TABLET | Freq: Every day | ORAL | 3 refills | Status: DC

## 2017-08-24 MED ORDER — ASPIRIN 81 MG PO TBEC: 81.0000 mg | DELAYED_RELEASE_TABLET | Freq: Every day | ORAL | 3 refills | Status: DC

## 2017-08-24 NOTE — Patient Instructions (Signed)
Decrease Asprin 81 mg (1 tab) daily  Increase Benazepril 40 mg (1 tab) daily  Labs drawn today (if we do not call you, then your lab work was stable)   Check BP daily for 2 weeks then e-mail to Dr. [name]    Your physician has requested that you have an echocardiogram. Echocardiography is a painless test that uses sound waves to create images of your heart. It provides your doctor with information about the size and shape of your heart and how well your heart's chambers and valves are working. This procedure takes approximately one hour. There are no restrictions for this procedure.  Your physician recommends that you schedule a follow-up appointment in: 6 months (Spetember. 2019) with Dr. [name] an a echocardiogram Please Call an Schedule Appointment

## 2017-08-25 NOTE — Progress Notes (Signed)
Patient ID: Samuel Willis, male   DOB: 03-23-1946, 71 y.o.   MRN: [id number] PCP: Dr. Anitra Lauth Cardiology: Dr. [name]  72 y.o. with history of bicuspid aortic valve and mild-moderate AS/mild AI on last echo in 6/18 as well as paroxysmal atrial fibrillation presents for followup. He had a Watchman device placed in 5/17. Cardiolite in 8/18 showed no evidence for ischemia/infarction.   Patient returns for followup of aortic valve disease and atrial fibrillation.  He has been doing well symptomatically since last appointment.  He has periodic palpitations that may be short runs of atrial fibrillation.  He is in NSR today.  No chest pain.  He exercises on an elliptical.  No exertional dyspnea.  Generally, he seems very active. SBP running high, 140s-150s at home.  Weight is up.   ECG: NSR, nonspecific T wave flattening (personally reviewed)  Labs (12/10): creatinine 1.4  Labs (6/12): K 4.5, creatinine 1.2, LDL 66, HDL 48 Labs (7/14): K 4.5, creatinine 1.3, LDL 71, HDL 49 Labs (1/15): K 4.8, creatinine 1.5, LDL 58, HDL 36 Labs (2/16): K 4.5, creatinine 1.17, LDL 83, HDL 42, TSH normal Labs (3/17): LDL 72, HDL 44 Labs (5/17): K 4.1, creatinine 1.2 Labs (4/18): LDL 92, HDL 37 Labs (8/18): K 5.1, creatinine 1.27, LDL 55, HDL 42  Allergies (verified):  No Known Drug Allergies   Past Medical History:  1.  Atrial fibrillation: Paroxysmal. Patient has a brief episode of irregular heart beating every 1-2 years. One was apparently documented as atrial fibrillation years ago in [employer]. His CHADSVASC score is 3 now that CAD has been found. LINQ monitor was placed and has documented paroxysmal atrial fibrillation. - S/p Watchman placement in 5/17.  2. Hypertension.  3. Hyperlipidemia.  4. CAD: Exercise treadmill Myoview done in January 2010. The patient exercised for 10 minutes and 46 seconds. He stopped due to fatigue. EF was 56%. There was normal perfusion with no evidence for ischemia or infarction.  LHC (1/12): 60-70% mLAD stenosis.  ETT-myoview was then done to assess for ischemia in the LAD territory. Patient exercised 10', EF was 69% with no evidence for ischemia or infarction.   - ETT-Cardiolite (8/18): EF 56%, 10 minutes exercise, no evidence for ischemia/infarction (normal).  5. Aortic stenosis and aortic regurgitation (bicuspid aortic valve). The patient did have an echocardiogram done on June 26, 2008. EF was 65%. There were no regional wall motion abnormalities. There was moderate aortic regurgitation. There was mild aortic stenosis by mean gradient which was 12 mmHg. There was mild ascending aorta and aortic root dilation. There was mild mitral regurgitation, mild left atrial enlargement, and right atrial enlargement. TEE was done, confirming a functionally bicuspid aortic valve with fusion of the right and noncoronary cusps. There was mild aortic stenosis and moderate aortic insufficiency. The ascending aorta was mildly dilated at 3.9 cm. TTE (1/10) showed EF 60% with mild LVH, mild AS (mean gradient 12), mild to moderate AR, normal LV size. TTE (1/12) with EF 60%, mild LVH, moderate diastolic dysfunction, mild AS (mean gradient 11 mmHg), mild AI, normal RV. MRA chest (1/12) with 3.7 cm aortic root and ascending aorta. TTE (2/13): EF 60-65%, mild LVH, mild AS, mild AI, mild MR.  Echo (2/15) with EF 60-65%, bicuspid aortic valve with mild aortic stenosis and mild to moderate AI, mild MR.  MRA chest (2/15) with 4.1 cm aortic root and 4.1 cm ascending aorta.  Echo (4/16) with EF 60-65%, mild AS mean gradient 16 mmHg.   -  TEE (7/17) with EF 60-65%, bicuspid aortic valve with mild to moderate AS, moderate AI.  - Echo (6/18): EF 60-65%, moderate LVH, bicuspid aortic valve with mild to moderate AS (mean gradient 12, AVA 1.75 cm^2), mild AI, ascending aorta 4.1 cm.  - MRA chest (6/18): Ascending aorta 3.8 cm 6. Biceps tendon rupture.  7. CKD 8. Ascending aortic aneurysm: TEE (2/17) with 4.0 cm  ascending aorta.  9. Type II diabetes  Family History:  The patient's mother has a history of stroke, also has questionable history of atrial fibrillation. The patient's father had an MI in his 52s.   Social History:  The patient is married, lives in [place], is a nonsmoker. He owns several businesses. He is not drinking alcohol. He works out with a Clinical research associate a couple of days a week and gets a significant amount of cardiovascular exercise.   Review of Systems  All systems reviewed and negative except as per HPI.   Current Outpatient Medications  Medication Sig Dispense Refill  . amLODipine (NORVASC) 10 MG tablet Take 1 tablet (10 mg total) by mouth daily. 30 tablet 3  . aspirin 81 MG EC tablet Take 1 tablet (81 mg total) by mouth daily. 30 tablet 3  . atorvastatin (LIPITOR) 40 MG tablet Take 1 tablet (40 mg total) by mouth daily. 90 tablet 3  . benazepril (LOTENSIN) 40 MG tablet Take 1 tablet (40 mg total) by mouth daily. 30 tablet 3  . metFORMIN (GLUCOPHAGE XR) 500 MG 24 hr tablet Take 2 tablets (1,000 mg total) by mouth daily with breakfast. 60 tablet 3   No current facility-administered medications for this encounter.     BP 130/76   Pulse 66   Wt 210 lb 4 oz (95.4 kg)   SpO2 100%   BMI 29.32 kg/m  General: NAD Neck: No JVD, no thyromegaly or thyroid nodule.  Lungs: Clear to auscultation bilaterally with normal respiratory effort. CV: Nondisplaced PMI.  Heart regular S1/S2, no S3/S4, 2/6 early SEM RUSB.  No peripheral edema.  No carotid bruit.  Normal pedal pulses.  Abdomen: Soft, nontender, no hepatosplenomegaly, no distention.  Skin: Intact without lesions or rashes.  Neurologic: Alert and oriented x 3.  Psych: Normal affect. Extremities: No clubbing or cyanosis.  HEENT: Normal.   Assessment/Plan: 1. Bicuspid aortic valve disorder: Mild to moderate AS and mild AI on 6/18 echo. Minimal dilation of ascending aorta on 6/18 MRA (3.8 cm).   - Repeat echo in 6/19.   2. CAD:  Moderate nonobstructive disease on prior cath.  Normal Cardiolite in 8/18.   - Continue statin.  - Decrease ASA to 81 mg daily.  3. Hyperlipidemia: Good lipids in 8/18.    4. HTN: BP high.   - Continue amlodipine and increase benazepril to 40 mg daily.   - BMET today.  - He will call in 2 weeks with BP readings on higher benazepril. .   5. Atrial fibrillation: Paroxysmal atrial fibrillation seen by [facility] monitor.  He is symptomatic when in atrial fibrillation, but episodes are short and infrequent.    - He has a Multimedia programmer.  - If atrial fibrillation episodes become more frequent, he would be a reasonable dronedarone candidate.  Would avoid Ic agent with moderate CAD.   6. CAD: Nonobstructive.  Continue ASA 325 with Watchman.   Followup in 6 months.   [name] 08/25/2017

## 2017-08-26 ENCOUNTER — Telehealth (HOSPITAL_COMMUNITY): Payer: Self-pay

## 2017-08-26 MED ORDER — ASPIRIN 81 MG PO TBEC: 325.0000 mg | DELAYED_RELEASE_TABLET | Freq: Every day | ORAL | 3 refills | Status: DC

## 2017-08-26 NOTE — Telephone Encounter (Signed)
cLean, Elby Showers, MD  Shirley Muscat, RN; Harvie Junior, CMA        I had told him to decrease aspirin to 81 mg daily. However, with Watchman present, he should continue it at 325 mg daily. Please call to tell him this.    Pt aware of results and agreeable to med changes (changes made in [facility])

## 2017-09-03 NOTE — Progress Notes (Signed)
Katrina contacted pt on 08/26/17 he is aware.

## 2017-09-06 ENCOUNTER — Telehealth: Payer: Self-pay | Admitting: *Deleted

## 2017-09-06 NOTE — Telephone Encounter (Signed)
LMOVM requesting call back to the [facility].  Gave direct number for return call.  LINQ at RRT as of 08/31/17.

## 2017-09-13 ENCOUNTER — Ambulatory Visit: Payer: Medicare Other | Admitting: Family Medicine

## 2017-09-20 ENCOUNTER — Ambulatory Visit: Payer: Medicare Other | Admitting: *Deleted

## 2017-09-20 ENCOUNTER — Ambulatory Visit (INDEPENDENT_AMBULATORY_CARE_PROVIDER_SITE_OTHER): Payer: Medicare Other | Admitting: *Deleted

## 2017-09-20 DIAGNOSIS — L905 Scar conditions and fibrosis of skin: Secondary | ICD-10-CM | POA: Diagnosis not present

## 2017-09-20 DIAGNOSIS — I480 Paroxysmal atrial fibrillation: Secondary | ICD-10-CM

## 2017-09-20 DIAGNOSIS — Z85828 Personal history of other malignant neoplasm of skin: Secondary | ICD-10-CM | POA: Diagnosis not present

## 2017-09-20 DIAGNOSIS — D485 Neoplasm of uncertain behavior of skin: Secondary | ICD-10-CM | POA: Diagnosis not present

## 2017-09-20 LAB — CUP PACEART REMOTE DEVICE CHECK
Date Time Interrogation Session: 20190304211107
MDC IDC PG IMPLANT DT: 20160115

## 2017-09-21 NOTE — Progress Notes (Signed)
Carelink Summary Report / Loop Recorder 

## 2017-09-23 NOTE — Telephone Encounter (Signed)
LMOVM requesting call back to the [facility]. Gave direct number for return call. 

## 2017-09-30 ENCOUNTER — Encounter: Payer: Self-pay | Admitting: *Deleted

## 2017-09-30 NOTE — Telephone Encounter (Signed)
LMOVM requesting call back to the [facility]. Gave direct number for return call.  Will mail letter to make patient aware of RRT. Unenrolled from [place], return kit ordered.

## 2017-10-12 ENCOUNTER — Other Ambulatory Visit: Payer: Self-pay

## 2017-10-12 ENCOUNTER — Ambulatory Visit: Payer: Medicare Other | Admitting: *Deleted

## 2017-10-12 ENCOUNTER — Other Ambulatory Visit: Payer: Self-pay | Admitting: Family Medicine

## 2017-10-12 VITALS — Ht 70.5 in | Wt 209.0 lb

## 2017-10-12 DIAGNOSIS — K219 Gastro-esophageal reflux disease without esophagitis: Secondary | ICD-10-CM

## 2017-10-12 DIAGNOSIS — Z1211 Encounter for screening for malignant neoplasm of colon: Secondary | ICD-10-CM

## 2017-10-12 NOTE — Progress Notes (Signed)
Patient denies any allergies to egg or soy products. Patient denies complications with anesthesia/sedation.  Patient denies oxygen use at home and denies diet medications. Patient denies information on colonoscopy. 

## 2017-10-21 ENCOUNTER — Encounter: Payer: Medicare Other | Admitting: *Deleted

## 2017-10-21 ENCOUNTER — Other Ambulatory Visit: Payer: Self-pay | Admitting: Internal Medicine

## 2017-10-21 LAB — CUP PACEART REMOTE DEVICE CHECK
Date Time Interrogation Session: 20190406220729
Implantable Pulse Generator Implant Date: 20160115

## 2017-10-26 ENCOUNTER — Encounter: Payer: Medicare Other | Admitting: Internal Medicine

## 2017-12-01 ENCOUNTER — Other Ambulatory Visit: Payer: Self-pay | Admitting: Family Medicine

## 2017-12-15 ENCOUNTER — Encounter: Payer: Self-pay | Admitting: Internal Medicine

## 2017-12-15 ENCOUNTER — Ambulatory Visit (AMBULATORY_SURGERY_CENTER): Payer: Medicare Other | Admitting: Internal Medicine

## 2017-12-15 VITALS — BP 121/65 | HR 46 | Temp 97.7°F | Resp 12 | Ht 71.0 in | Wt 209.0 lb

## 2017-12-15 DIAGNOSIS — Z1211 Encounter for screening for malignant neoplasm of colon: Secondary | ICD-10-CM

## 2017-12-15 DIAGNOSIS — Z1212 Encounter for screening for malignant neoplasm of rectum: Secondary | ICD-10-CM

## 2017-12-15 MED ORDER — SODIUM CHLORIDE 0.9 % IV SOLN: 500.0000 mL | Freq: Once | INTRAVENOUS | Status: DC

## 2017-12-15 NOTE — Patient Instructions (Addendum)
No polyps or cancer. No need for further colon cancer screening tests (you did not have polyps in 2004, either).  You do have Barrett's esophagus - a pre-cancerous condition of the esophagus and I will arrange for you to return and have an endoscopy to recheck that - as we discussed before sedation was given.  I appreciate the opportunity to care for you. Gatha Mayer, MD, FACG  YOU HAD AN ENDOSCOPIC PROCEDURE TODAY AT [facility]:   Refer to the procedure report that was given to you for any specific questions about what was found during the examination.  If the procedure report does not answer your questions, please call your gastroenterologist to clarify.  If you requested that your care partner not be given the details of your procedure findings, then the procedure report has been included in a sealed envelope for you to review at your convenience later.  YOU SHOULD EXPECT: Some feelings of bloating in the abdomen. Passage of more gas than usual.  Walking can help get rid of the air that was put into your GI tract during the procedure and reduce the bloating. If you had a lower endoscopy (such as a colonoscopy or flexible sigmoidoscopy) you may notice spotting of blood in your stool or on the toilet paper. If you underwent a bowel prep for your procedure, you may not have a normal bowel movement for a few days.  Please Note:  You might notice some irritation and congestion in your nose or some drainage.  This is from the oxygen used during your procedure.  There is no need for concern and it should clear up in a day or so.  SYMPTOMS TO REPORT IMMEDIATELY:   Following lower endoscopy (colonoscopy or flexible sigmoidoscopy):  Excessive amounts of blood in the stool  Significant tenderness or worsening of abdominal pains  Swelling of the abdomen that is new, acute  Fever of 100F or higher For urgent or emergent issues, a gastroenterologist can be reached at any  hour by calling [phone].   DIET:  We do recommend a small meal at first, but then you may proceed to your regular diet.  Drink plenty of fluids but you should avoid alcoholic beverages for 24 hours.  ACTIVITY:  You should plan to take it easy for the rest of today and you should NOT DRIVE or use heavy machinery until tomorrow (because of the sedation medicines used during the test).    FOLLOW UP: Our staff will call the number listed on your records the next business day following your procedure to check on you and address any questions or concerns that you may have regarding the information given to you following your procedure. If we do not reach you, we will leave a message.  However, if you are feeling well and you are not experiencing any problems, there is no need to return our call.  We will assume that you have returned to your regular daily activities without incident.  If any biopsies were taken you will be contacted by phone or by letter within the next 1-3 weeks.  Please call us at [phone] if you have not heard about the biopsies in 3 weeks.    SIGNATURES/CONFIDENTIALITY: You and/or your care partner have signed paperwork which will be entered into your electronic medical record.  These signatures attest to the fact that that the information above on your After Visit Summary has been reviewed and is understood.  Full  responsibility of the confidentiality of this discharge information lies with you and/or your care-partner.

## 2017-12-15 NOTE — Progress Notes (Signed)
A/ox3 pleased with MAC, report to RN 

## 2017-12-15 NOTE — Progress Notes (Signed)
Pt's states no medical or surgical changes since previsit or office visit. 

## 2017-12-15 NOTE — Op Note (Signed)
[facility] Patient Name: Samuel Willis Procedure Date: 12/15/2017 11:24 AM MRN: [id number] Endoscopist: Gatha Mayer , MD Age: 72 Referring MD:  Date of Birth: Mar 20, 1946 Gender: Male Account #: [id number] Procedure:                Colonoscopy Indications:              Screening for colorectal malignant neoplasm Medicines:                Propofol per Anesthesia, Monitored Anesthesia Care Procedure:                Pre-Anesthesia Assessment:                           - Prior to the procedure, a History and Physical                            was performed, and patient medications and                            allergies were reviewed. The patient's tolerance of                            previous anesthesia was also reviewed. The risks                            and benefits of the procedure and the sedation                            options and risks were discussed with the patient.                            All questions were answered, and informed consent                            was obtained. Prior Anticoagulants: The patient has                            taken no previous anticoagulant or antiplatelet                            agents. ASA Grade Assessment: II - A patient with                            mild systemic disease. After reviewing the risks                            and benefits, the patient was deemed in                            satisfactory condition to undergo the procedure.                           After obtaining informed consent, the colonoscope  was passed under direct vision. Throughout the                            procedure, the patient's blood pressure, pulse, and                            oxygen saturations were monitored continuously. The                            Colonoscope was introduced through the anus and                            advanced to the the cecum, identified by   appendiceal orifice and ileocecal valve. The                            appendiceal orifice and the rectum were                            photographed. The quality of the bowel preparation                            was good. The colonoscopy was performed without                            difficulty. The patient tolerated the procedure                            well. The bowel preparation used was Miralax. Scope In: 11:31:51 AM Scope Out: 11:48:00 AM Scope Withdrawal Time: 0 hours 13 minutes 40 seconds  Total Procedure Duration: 0 hours 16 minutes 9 seconds  Findings:                 The perianal and digital rectal examinations were                            normal. Pertinent negatives include normal prostate                            (size, shape, and consistency).                           The colon (entire examined portion) appeared normal.                           No additional abnormalities were found on                            retroflexion. Complications:            No immediate complications. Estimated blood loss:                            None. Estimated Blood Loss:     Estimated blood loss: none. Impression:               -  The entire examined colon is normal.                           - No specimens collected. Recommendation:           - Patient has a contact number available for                            emergencies. The signs and symptoms of potential                            delayed complications were discussed with the                            patient. Return to normal activities tomorrow.                            Written discharge instructions were provided to the                            patient.                           - Resume previous diet.                           - Continue present medications.                           - No repeat colonoscopy due to age.                           - Needs previsit and direct EGD scheduled re:                             Barrett's esophagus Gatha Mayer, MD 12/15/2017 11:57:02 AM This report has been signed electronically.

## 2017-12-20 ENCOUNTER — Telehealth: Payer: Self-pay | Admitting: *Deleted

## 2017-12-20 NOTE — Telephone Encounter (Signed)
  Follow up Call-  Call back number 12/15/2017  Post procedure Call Back phone  # [phone]  Permission to leave phone message Yes  Some recent data might be hidden     Patient questions:  Do you have a fever, pain , or abdominal swelling? No. Pain Score  0 *  Have you tolerated food without any problems? Yes.    Have you been able to return to your normal activities? Yes.    Do you have any questions about your discharge instructions: Diet   No. Medications  No. Follow up visit  No.  Do you have questions or concerns about your Care? No.  Actions: * If pain score is 4 or above: No action needed, pain <4.

## 2017-12-22 ENCOUNTER — Other Ambulatory Visit: Payer: Self-pay

## 2017-12-22 ENCOUNTER — Ambulatory Visit (AMBULATORY_SURGERY_CENTER): Payer: Self-pay

## 2017-12-22 VITALS — Ht 71.0 in | Wt 203.2 lb

## 2017-12-22 DIAGNOSIS — K2270 Barrett's esophagus without dysplasia: Secondary | ICD-10-CM

## 2017-12-22 NOTE — Progress Notes (Signed)
No egg or soy allergy known to patient  No issues with past sedation with any surgeries  or procedures, no intubation problems  No diet pills per patient No home 02 use per patient  No blood thinners per patient  Pt denies issues with constipation  No A fib or A flutter  EMMI video sent to pt's e mail , pt declined    

## 2018-01-25 ENCOUNTER — Encounter: Payer: Self-pay | Admitting: Internal Medicine

## 2018-01-25 ENCOUNTER — Ambulatory Visit (AMBULATORY_SURGERY_CENTER): Payer: Medicare Other | Admitting: Internal Medicine

## 2018-01-25 VITALS — BP 106/62 | HR 52 | Temp 97.1°F | Resp 14 | Ht 71.0 in | Wt 209.0 lb

## 2018-01-25 DIAGNOSIS — K2270 Barrett's esophagus without dysplasia: Secondary | ICD-10-CM

## 2018-01-25 DIAGNOSIS — I10 Essential (primary) hypertension: Secondary | ICD-10-CM | POA: Diagnosis not present

## 2018-01-25 DIAGNOSIS — R1310 Dysphagia, unspecified: Secondary | ICD-10-CM

## 2018-01-25 DIAGNOSIS — I4891 Unspecified atrial fibrillation: Secondary | ICD-10-CM | POA: Diagnosis not present

## 2018-01-25 DIAGNOSIS — R1319 Other dysphagia: Secondary | ICD-10-CM

## 2018-01-25 MED ORDER — SODIUM CHLORIDE 0.9 % IV SOLN: 500.0000 mL | Freq: Once | INTRAVENOUS | Status: DC

## 2018-01-25 NOTE — Progress Notes (Signed)
Report given to PACU, vss 

## 2018-01-25 NOTE — Patient Instructions (Addendum)
   I saw the Barrett's esophagus changes again - nothing suspicious. I did stretch or dilate the esophagus to help you swallow better.  I appreciate the opportunity to care for you. Gatha Mayer, MD, FACG  YOU HAD AN ENDOSCOPIC PROCEDURE TODAY AT [facility]:   Refer to the procedure report that was given to you for any specific questions about what was found during the examination.  If the procedure report does not answer your questions, please call your gastroenterologist to clarify.  If you requested that your care partner not be given the details of your procedure findings, then the procedure report has been included in a sealed envelope for you to review at your convenience later.  YOU SHOULD EXPECT: Some feelings of bloating in the abdomen. Passage of more gas than usual.  Walking can help get rid of the air that was put into your GI tract during the procedure and reduce the bloating. If you had a lower endoscopy (such as a colonoscopy or flexible sigmoidoscopy) you may notice spotting of blood in your stool or on the toilet paper. If you underwent a bowel prep for your procedure, you may not have a normal bowel movement for a few days.  Please Note:  You might notice some irritation and congestion in your nose or some drainage.  This is from the oxygen used during your procedure.  There is no need for concern and it should clear up in a day or so.  SYMPTOMS TO REPORT IMMEDIATELY:   Following upper endoscopy (EGD)  Vomiting of blood or coffee ground material  New chest pain or pain under the shoulder blades  Painful or persistently difficult swallowing  New shortness of breath  Fever of 100F or higher  Black, tarry-looking stools  For urgent or emergent issues, a gastroenterologist can be reached at any hour by calling [phone].   DIET: clear liquids x 1 hour, until 1100 am Soft foods for the rest of the day. Resume your diet in the am.  ACTIVITY:   You should plan to take it easy for the rest of today and you should NOT DRIVE or use heavy machinery until tomorrow (because of the sedation medicines used during the test).    FOLLOW UP: Our staff will call the number listed on your records the next business day following your procedure to check on you and address any questions or concerns that you may have regarding the information given to you following your procedure. If we do not reach you, we will leave a message.  However, if you are feeling well and you are not experiencing any problems, there is no need to return our call.  We will assume that you have returned to your regular daily activities without incident.  If any biopsies were taken you will be contacted by phone or by letter within the next 1-3 weeks.  Please call us at [phone] if you have not heard about the biopsies in 3 weeks.    SIGNATURES/CONFIDENTIALITY: You and/or your care partner have signed paperwork which will be entered into your electronic medical record.  These signatures attest to the fact that that the information above on your After Visit Summary has been reviewed and is understood.  Full responsibility of the confidentiality of this discharge information lies with you and/or your care-partner.

## 2018-01-25 NOTE — Progress Notes (Signed)
Called to room to assist during endoscopic procedure.  Patient ID and intended procedure confirmed with present staff. Received instructions for my participation in the procedure from the performing physician.  

## 2018-01-25 NOTE — Op Note (Signed)
[facility] Patient Name: Lazarus Sudbury Procedure Date: 01/25/2018 9:36 AM MRN: [id number] Endoscopist: Gatha Mayer , MD Age: 72 Referring MD:  Date of Birth: 07-20-1945 Gender: Male Account #: [id number] Procedure:                Upper GI endoscopy Indications:              Dysphagia, Follow-up of Barrett's esophagus Medicines:                Propofol per Anesthesia, Monitored Anesthesia Care Procedure:                Pre-Anesthesia Assessment:                           - Prior to the procedure, a History and Physical                            was performed, and patient medications and                            allergies were reviewed. The patient's tolerance of                            previous anesthesia was also reviewed. The risks                            and benefits of the procedure and the sedation                            options and risks were discussed with the patient.                            All questions were answered, and informed consent                            was obtained. Prior Anticoagulants: The patient has                            taken no previous anticoagulant or antiplatelet                            agents. ASA Grade Assessment: III - A patient with                            severe systemic disease. After reviewing the risks                            and benefits, the patient was deemed in                            satisfactory condition to undergo the procedure.                           After obtaining informed consent, the endoscope was  passed under direct vision. Throughout the                            procedure, the patient's blood pressure, pulse, and                            oxygen saturations were monitored continuously. The                            Endoscope was introduced through the mouth, and                            advanced to the second part of duodenum. The upper                GI endoscopy was accomplished without difficulty.                            The patient tolerated the procedure well. Scope In: Scope Out: Findings:                 There were esophageal mucosal changes secondary to                            established short-segment Barrett's disease present                            in the distal esophagus. The maximum longitudinal                            extent of these mucosal changes was 3 cm in length.                            Mucosa was biopsied with a cold forceps for                            histology in a targeted manner from 37 to 40 cm                            from the incisors. One specimen bottle was sent to                            pathology. Verification of patient identification                            for the specimen was done. Estimated blood loss was                            minimal.                           The exam was otherwise without abnormality.                           The cardia and gastric fundus were normal on  retroflexion.                           The scope was withdrawn. Dilation was performed in                            the entire esophagus with a Maloney dilator with                            mild resistance at 54 Fr. Estimated blood loss:                            none. Complications:            No immediate complications. Estimated Blood Loss:     Estimated blood loss was minimal. Impression:               - Esophageal mucosal changes secondary to                            established short-segment Barrett's disease.                            Biopsied. 3 cm long with multiple islands                           - The examination was otherwise normal.                           - Dilation performed in the entire esophagus. Recommendation:           - Patient has a contact number available for                            emergencies. The signs and symptoms  of potential                            delayed complications were discussed with the                            patient. Return to normal activities tomorrow.                            Written discharge instructions were provided to the                            patient.                           - Clear liquids x 1 hour then soft foods rest of                            day. Start prior diet tomorrow.                           - Continue present medications.                           -  Await pathology results.                           - Repeat upper endoscopy for surveillance based on                            pathology results. Gatha Mayer, MD 01/25/2018 9:54:53 AM This report has been signed electronically.

## 2018-01-25 NOTE — Progress Notes (Signed)
Pt's states no medical or surgical changes since previsit or office visit. 

## 2018-01-26 ENCOUNTER — Telehealth: Payer: Self-pay | Admitting: *Deleted

## 2018-01-26 NOTE — Telephone Encounter (Signed)
  Follow up Call-  Call back number 01/25/2018 12/15/2017  Post procedure Call Back phone  # [phone]  Permission to leave phone message No Yes  Some recent data might be hidden     Patient questions:  Do you have a fever, pain , or abdominal swelling? No. Pain Score  0 *  Have you tolerated food without any problems? Yes.    Have you been able to return to your normal activities? Yes.    Do you have any questions about your discharge instructions: Diet   No. Medications  No. Follow up visit  No.  Do you have questions or concerns about your Care? No.  Actions: * If pain score is 4 or above: No action needed, pain <4.

## 2018-01-27 ENCOUNTER — Other Ambulatory Visit (INDEPENDENT_AMBULATORY_CARE_PROVIDER_SITE_OTHER): Payer: Medicare Other

## 2018-01-27 DIAGNOSIS — E119 Type 2 diabetes mellitus without complications: Secondary | ICD-10-CM

## 2018-01-27 DIAGNOSIS — Z1159 Encounter for screening for other viral diseases: Secondary | ICD-10-CM

## 2018-01-27 LAB — COMPREHENSIVE METABOLIC PANEL
ALT: 20 U/L (ref 0–53)
AST: 15 U/L (ref 0–37)
Albumin: 4.3 g/dL (ref 3.5–5.2)
Alkaline Phosphatase: 65 U/L (ref 39–117)
BUN: 29 mg/dL — ABNORMAL HIGH (ref 6–23)
CO2: 29 mEq/L (ref 19–32)
Calcium: 9.7 mg/dL (ref 8.4–10.5)
Chloride: 107 mEq/L (ref 96–112)
Creatinine, Ser: 1.26 mg/dL (ref 0.40–1.50)
GFR: 59.88 mL/min — ABNORMAL LOW (ref >60.00)
Glucose, Bld: 118 mg/dL — ABNORMAL HIGH (ref 70–99)
Potassium: 5.3 mEq/L — ABNORMAL HIGH (ref 3.5–5.1)
Sodium: 142 mEq/L (ref 135–145)
Total Bilirubin: 0.5 mg/dL (ref 0.2–1.2)
Total Protein: 6.4 g/dL (ref 6.0–8.3)

## 2018-01-27 LAB — LIPID PANEL
Cholesterol: 127 mg/dL (ref 0–200)
HDL: 41.2 mg/dL (ref >39.00)
LDL Cholesterol: 69 mg/dL (ref 0–99)
NonHDL: 85.34
Total CHOL/HDL Ratio: 3
Triglycerides: 80 mg/dL (ref 0.0–149.0)
VLDL: 16 mg/dL (ref 0.0–40.0)

## 2018-01-27 LAB — HEMOGLOBIN A1C: Hgb A1c MFr Bld: 6.3 % (ref 4.6–6.5)

## 2018-01-28 ENCOUNTER — Other Ambulatory Visit: Payer: Medicare Other

## 2018-01-28 LAB — HEPATITIS C ANTIBODY
Hepatitis C Ab: NONREACTIVE
SIGNAL TO CUT-OFF: 0.01 (ref <1.00)

## 2018-02-02 ENCOUNTER — Encounter: Payer: Self-pay | Admitting: Family Medicine

## 2018-02-02 ENCOUNTER — Ambulatory Visit (INDEPENDENT_AMBULATORY_CARE_PROVIDER_SITE_OTHER): Payer: Medicare Other | Admitting: Family Medicine

## 2018-02-02 VITALS — HR 61 | Temp 98.0°F | Ht 71.0 in | Wt 202.0 lb

## 2018-02-02 DIAGNOSIS — R0683 Snoring: Secondary | ICD-10-CM

## 2018-02-02 DIAGNOSIS — E875 Hyperkalemia: Secondary | ICD-10-CM

## 2018-02-02 DIAGNOSIS — R3912 Poor urinary stream: Secondary | ICD-10-CM

## 2018-02-02 DIAGNOSIS — E119 Type 2 diabetes mellitus without complications: Secondary | ICD-10-CM

## 2018-02-02 DIAGNOSIS — R6884 Jaw pain: Secondary | ICD-10-CM

## 2018-02-02 LAB — BASIC METABOLIC PANEL
BUN: 25 mg/dL — ABNORMAL HIGH (ref 6–23)
CO2: 27 mEq/L (ref 19–32)
Calcium: 10 mg/dL (ref 8.4–10.5)
Chloride: 106 mEq/L (ref 96–112)
Creatinine, Ser: 1.02 mg/dL (ref 0.40–1.50)
GFR: 76.41 mL/min (ref >60.00)
Glucose, Bld: 105 mg/dL — ABNORMAL HIGH (ref 70–99)
Potassium: 4.9 mEq/L (ref 3.5–5.1)
Sodium: 140 mEq/L (ref 135–145)

## 2018-02-02 LAB — PSA: PSA: 2.57 ng/mL (ref 0.10–4.00)

## 2018-02-02 NOTE — Progress Notes (Signed)
Samuel Willis is a 72 y.o. male is here for follow up.  History of Present Illness:   HPI:   1. Diabetes mellitus without complication ([place]). Compliant. No side effects. Cardiovascular ROS: no chest pain or dyspnea on exertion.  Lab Results  Component Value Date   HGBA1C 6.3 01/27/2018    2. Hyperkalemia. On recent BMP. Needs recheck.   3. Weak urine stream. Not new. Wants PSA checked yearly.     4. Snoring. Hx of uvula removal. With Hx of nose surgery.    Health Maintenance Due  Topic Date Due  . OPHTHALMOLOGY EXAM  08/07/1955  . PNA vac Low Risk Adult (2 of 2 - PPSV23) 11/24/2015   Depression screen [facility] 2/9 02/02/2018 09/17/2016 08/26/2015  Decreased Interest 0 0 0  Down, Depressed, Hopeless 0 0 0  PHQ - 2 Score 0 0 0   PMHx, SurgHx, SocialHx, FamHx, Medications, and Allergies were reviewed in the Visit Navigator and updated as appropriate.   Patient Active Problem List   Diagnosis Date Noted  . MCI (mild cognitive impairment) 01/09/2016  . Bicuspid aortic valve   . Aortic insufficiency   . Aortic stenosis   . Ascending aortic aneurysm ([place])   . Paroxysmal atrial fibrillation ([place]) 10/31/2015  . Diabetes mellitus without complication ([place]) 76/73/4193  . Asbestos exposure 06/26/2013  . Contusion of right knee 04/26/2012  . CAD (coronary artery disease) 07/26/2011  . Barrett's esophagus 02/23/2011  . GERD (gastroesophageal reflux disease) 01/06/2011  . Esophageal dysphagia 11/30/2010  . 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 Hyperlipidemia 07/16/2008  . Essential hypertension 07/16/2008   Social History   Tobacco Use  . Smoking status: Former Smoker    Packs/day: 0.50    Years: 10.00    Pack years: 5.00    Types: Cigarettes    Last attempt to quit: 11/03/1993    Years since quitting: 24.2  . Smokeless tobacco: Never Used  Substance Use Topics  . Alcohol use: No  . Drug use: No   Current Medications and Allergies:   .  amLODipine (NORVASC) 10  MG tablet, TAKE 1 TABLET BY MOUTH ONCE DAILY, Disp: 90 tablet, Rfl: 1 .  atorvastatin (LIPITOR) 40 MG tablet, Take 1 tablet (40 mg total) by mouth daily., Disp: 90 tablet, Rfl: 3 .  benazepril (LOTENSIN) 40 MG tablet, Take 1 tablet (40 mg total) by mouth daily., Disp: 30 tablet, Rfl: 3 .  metFORMIN (GLUCOPHAGE-XR) 500 MG 24 hr tablet, TAKE 2 TABLETS BY MOUTH ONCE DAILY WITH BREAKFAST, Disp: 180 tablet, Rfl: 1 .  omeprazole (PRILOSEC) 40 MG capsule, TAKE 1 CAPSULE BY MOUTH ONCE DAILY., Disp: 90 capsule, Rfl: 3 .  aspirin 325 MG tablet, Take 325 mg by mouth daily., Disp: , Rfl:   No Known Allergies   Review of Systems   Pertinent items are noted in the HPI. Otherwise, ROS is negative.  Vitals:   Vitals:   02/02/18 0853  Pulse: 61  Temp: 98 F (36.7 C)  TempSrc: Oral  SpO2: 97%  Weight: 202 lb (91.6 kg)  Height: 5\' 11"  (1.803 m)     Body mass index is 28.17 kg/m.  Physical Exam:   Physical Exam  Constitutional: He is oriented to person, place, and time. He appears well-developed and well-nourished. No distress.  HENT:  Head: Normocephalic and atraumatic.  Right Ear: External ear normal.  Left Ear: External ear normal.  Nose: Nose normal.  Mouth/Throat: Oropharynx is clear and moist.  Eyes: Pupils are equal, round, and reactive to light. Conjunctivae and EOM are normal.  Neck: Normal range of motion. Neck supple.  Cardiovascular: Normal rate, regular rhythm and intact distal pulses.  Murmur heard.  Systolic murmur is present. Pulmonary/Chest: Effort normal and breath sounds normal.  Abdominal: Soft. Bowel sounds are normal.  Musculoskeletal: Normal range of motion.  Neurological: He is alert and oriented to person, place, and time.  Skin: Skin is warm and dry.  Psychiatric: He has a normal mood and affect. His behavior is normal. Judgment and thought content normal.  Nursing note and vitals reviewed.  Results for orders placed or performed in visit on 01/27/18    Hemoglobin A1c  Result Value Ref Range   Hgb A1c MFr Bld 6.3 4.6 - 6.5 %  Lipid panel  Result Value Ref Range   Cholesterol 127 0 - 200 mg/dL   Triglycerides 80.0 0.0 - 149.0 mg/dL   HDL 41.20 >39.00 mg/dL   VLDL 16.0 0.0 - 40.0 mg/dL   LDL Cholesterol 69 0 - 99 mg/dL   Total CHOL/HDL Ratio 3    NonHDL 85.34   Comprehensive metabolic panel  Result Value Ref Range   Sodium 142 135 - 145 mEq/L   Potassium 5.3 (H) 3.5 - 5.1 mEq/L   Chloride 107 96 - 112 mEq/L   CO2 29 19 - 32 mEq/L   Glucose, Bld 118 (H) 70 - 99 mg/dL   BUN 29 (H) 6 - 23 mg/dL   Creatinine, Ser 1.26 0.40 - 1.50 mg/dL   Total Bilirubin 0.5 0.2 - 1.2 mg/dL   Alkaline Phosphatase 65 39 - 117 U/L   AST 15 0 - 37 U/L   ALT 20 0 - 53 U/L   Total Protein 6.4 6.0 - 8.3 g/dL   Albumin 4.3 3.5 - 5.2 g/dL   Calcium 9.7 8.4 - 10.5 mg/dL   GFR 59.88 (L) >60.00 mL/min  Hepatitis C antibody  Result Value Ref Range   Hepatitis C Ab NON-REACTIVE NON-REACTI   SIGNAL TO CUT-OFF 0.01 <1.00   Assessment and Plan:   Samuel Willis was seen today for follow-up.  Diagnoses and all orders for this visit:  Diabetes mellitus without complication ([place])  Hyperkalemia -     Basic metabolic panel  Weak urine stream -     PSA  Snoring -     Ambulatory referral to ENT  Maxilla pain -     Ambulatory referral to ENT   . Reviewed expectations re: course of current medical issues. . Discussed self-management of symptoms. . Outlined signs and symptoms indicating need for more acute intervention. . Patient verbalized understanding and all questions were answered. Marland Kitchen Health Maintenance issues including appropriate healthy diet, exercise, and smoking avoidance were discussed with patient. . See orders for this visit as documented in the electronic medical record. . Patient received an After Visit Summary.   Briscoe Deutscher, DO [place] [place] 02/02/2018

## 2018-02-04 ENCOUNTER — Telehealth: Payer: Self-pay | Admitting: Family Medicine

## 2018-02-04 NOTE — Telephone Encounter (Signed)
Left message on patients phone that Dr. Juleen China said that kidney function was good. I will leave boxes on my desk if patient wants them back.

## 2018-02-04 NOTE — Telephone Encounter (Signed)
New Message  Pt dropped off letter explaining his GFR value being high due to possible kidney finction issues.  Pt wanted MD to look at two supplements: Cardio for life and Orrin Brigham to see what the MD thinks in regards to possibly affecting his kidney functions.  Please f/u with pt

## 2018-02-06 ENCOUNTER — Encounter: Payer: Self-pay | Admitting: Internal Medicine

## 2018-02-07 ENCOUNTER — Telehealth: Payer: Self-pay | Admitting: Family Medicine

## 2018-02-07 NOTE — Telephone Encounter (Signed)
Added to open message.  

## 2018-02-07 NOTE — Telephone Encounter (Signed)
Left message to return call to our office.  

## 2018-02-07 NOTE — Telephone Encounter (Signed)
Follow up  Pt returning nurses call but called nurse and no answer.  Please f/u with pt

## 2018-02-07 NOTE — Telephone Encounter (Signed)
Per phone message received:  Follow up  Pt returning nurses call but called nurse and no answer.  Please f/u with pt

## 2018-02-08 DIAGNOSIS — L570 Actinic keratosis: Secondary | ICD-10-CM | POA: Diagnosis not present

## 2018-02-08 DIAGNOSIS — Z85828 Personal history of other malignant neoplasm of skin: Secondary | ICD-10-CM | POA: Diagnosis not present

## 2018-02-08 DIAGNOSIS — L821 Other seborrheic keratosis: Secondary | ICD-10-CM | POA: Diagnosis not present

## 2018-02-08 NOTE — Telephone Encounter (Signed)
Notified patient of message and he said it was ok to get ride of boxes.

## 2018-02-14 ENCOUNTER — Telehealth: Payer: Self-pay | Admitting: Physician Assistant

## 2018-02-14 NOTE — Telephone Encounter (Signed)
Spoke with patient.  He went into atrial fibrillation on 9/1 at approximately 9 PM.  He is very clear about this, and that the symptoms always start very suddenly.  Since he went into atrial fibrillation, his heart rate has been elevated, 90s-100 tenths.  He is not particularly symptomatic, feels a little weak.  He denies chest pain, shortness of breath, near syncope.  He has metoprolol 25 mg tablets and has taken 2 of them since the atrial fibrillation started.  His systolic blood pressures normally in the 140s, it is currently in the 120s.   He is supposed to be taking aspirin 325 mg daily, but has not been doing so.  Normally, the symptoms only last a few hours, but this 1 has continued more than 12 hours, he is a little concerned.  Requested that he take the metoprolol 25 mg 3 times daily. Requested he take the aspirin 325 mg daily.  I will route this message to Dr. [name] and send a staff message to Chantel at the heart failure clinic to call him and see if he is still in atrial fibrillation tomorrow.  If so, may need rx change.   Rosaria Ferries, PA-C 02/14/2018 2:50 PM [facility] [phone]

## 2018-02-15 ENCOUNTER — Telehealth: Payer: Self-pay | Admitting: Internal Medicine

## 2018-02-15 NOTE — Telephone Encounter (Signed)
Left CVM per DPR.  Notified Pt he has a afib clinic appt 02/16/2018 at 3:30 pm.  Directions given.  Requested call back to confirm.

## 2018-02-15 NOTE — Telephone Encounter (Signed)
New Message: ° ° ° °Patient is requesting  a call back  °

## 2018-02-15 NOTE — Telephone Encounter (Signed)
° °  Patient requesting to speak with nurse   BP 109/70 HR 113

## 2018-02-15 NOTE — Telephone Encounter (Signed)
Patient reports he went into afib over the weekend, reports he will normally flip back out of it in 2 days however he has not as of today.  Reports he did start metoprolol as ordered and reports his b/p maybe lower cause he does have some mild dizziness.   Advised will forward to [facility]-triage/Dr. Allreds nurse

## 2018-02-16 ENCOUNTER — Ambulatory Visit (HOSPITAL_COMMUNITY)
Admission: RE | Admit: 2018-02-16 | Discharge: 2018-02-16 | Disposition: A | Discharge status: Home / Self Care | Payer: Medicare Other | Source: Ambulatory Visit | Attending: Nurse Practitioner | Admitting: Nurse Practitioner

## 2018-02-16 ENCOUNTER — Encounter (HOSPITAL_COMMUNITY): Payer: Self-pay | Admitting: Nurse Practitioner

## 2018-02-16 VITALS — BP 108/64 | HR 129 | Ht 71.0 in | Wt 203.0 lb

## 2018-02-16 DIAGNOSIS — N183 Chronic kidney disease, stage 3 (moderate): Secondary | ICD-10-CM | POA: Insufficient documentation

## 2018-02-16 DIAGNOSIS — I2510 Atherosclerotic heart disease of native coronary artery without angina pectoris: Secondary | ICD-10-CM | POA: Insufficient documentation

## 2018-02-16 DIAGNOSIS — Z8249 Family history of ischemic heart disease and other diseases of the circulatory system: Secondary | ICD-10-CM | POA: Insufficient documentation

## 2018-02-16 DIAGNOSIS — Z823 Family history of stroke: Secondary | ICD-10-CM | POA: Insufficient documentation

## 2018-02-16 DIAGNOSIS — Z87891 Personal history of nicotine dependence: Secondary | ICD-10-CM | POA: Insufficient documentation

## 2018-02-16 DIAGNOSIS — Z7982 Long term (current) use of aspirin: Secondary | ICD-10-CM | POA: Diagnosis not present

## 2018-02-16 DIAGNOSIS — I129 Hypertensive chronic kidney disease with stage 1 through stage 4 chronic kidney disease, or unspecified chronic kidney disease: Secondary | ICD-10-CM | POA: Diagnosis not present

## 2018-02-16 DIAGNOSIS — E1122 Type 2 diabetes mellitus with diabetic chronic kidney disease: Secondary | ICD-10-CM | POA: Diagnosis not present

## 2018-02-16 DIAGNOSIS — K219 Gastro-esophageal reflux disease without esophagitis: Secondary | ICD-10-CM | POA: Diagnosis not present

## 2018-02-16 DIAGNOSIS — I480 Paroxysmal atrial fibrillation: Secondary | ICD-10-CM

## 2018-02-16 DIAGNOSIS — E785 Hyperlipidemia, unspecified: Secondary | ICD-10-CM | POA: Diagnosis not present

## 2018-02-16 DIAGNOSIS — Z7984 Long term (current) use of oral hypoglycemic drugs: Secondary | ICD-10-CM | POA: Insufficient documentation

## 2018-02-16 DIAGNOSIS — Z9889 Other specified postprocedural states: Secondary | ICD-10-CM | POA: Diagnosis not present

## 2018-02-16 LAB — CBC
HCT: 47.8 % (ref 39.0–52.0)
Hemoglobin: 16.1 g/dL (ref 13.0–17.0)
MCH: 31.9 pg (ref 26.0–34.0)
MCHC: 33.7 g/dL (ref 30.0–36.0)
MCV: 94.8 fL (ref 78.0–100.0)
PLATELETS: 218 10*3/uL (ref 150–400)
RBC: 5.04 MIL/uL (ref 4.22–5.81)
RDW: 12.1 % (ref 11.5–15.5)
WBC: 5.7 10*3/uL (ref 4.0–10.5)

## 2018-02-16 LAB — BASIC METABOLIC PANEL
Anion gap: 10 (ref 5–15)
BUN: 19 mg/dL (ref 8–23)
CO2: 25 mmol/L (ref 22–32)
CREATININE: 1.22 mg/dL (ref 0.61–1.24)
Calcium: 9.5 mg/dL (ref 8.9–10.3)
Chloride: 109 mmol/L (ref 98–111)
GFR calc Af Amer: >60 mL/min (ref >60)
GFR calc non Af Amer: 58 mL/min — ABNORMAL LOW (ref >60)
GLUCOSE: 115 mg/dL — AB (ref 70–99)
Potassium: 4.7 mmol/L (ref 3.5–5.1)
SODIUM: 144 mmol/L (ref 135–145)

## 2018-02-16 LAB — TSH: TSH: 2.903 u[IU]/mL (ref 0.350–4.500)

## 2018-02-16 MED ORDER — APIXABAN 5 MG PO TABS: 5.0000 mg | ORAL_TABLET | Freq: Two times a day (BID) | ORAL | 2 refills | Status: DC

## 2018-02-16 NOTE — Patient Instructions (Addendum)
Call in the morning with your blood pressure and heart BEFORE taking morning medications. [phone]  Start eliquis 5mg  twice a day    Cardioversion scheduled for Tuesday, September 10th  - Arrive at the [facility] and go to admitting at 11:30AM  - Do not eat or drink anything after midnight the night prior to your procedure.  - Take all your morning medication with a sip of water prior to arrival.  - Do Not miss any doses of Eliquis.  - You will not be able to drive home after your procedure.

## 2018-02-16 NOTE — Telephone Encounter (Signed)
Left detailed message advising of afib appt today at 3:30 pm.  Afib clinic phone # given-advised to call to confirm with afib clinic.

## 2018-02-16 NOTE — Telephone Encounter (Signed)
Pt confirmed with afib clinic for appt today.  No further action at this time.

## 2018-02-17 ENCOUNTER — Other Ambulatory Visit (HOSPITAL_COMMUNITY): Payer: Self-pay | Admitting: *Deleted

## 2018-02-17 NOTE — Progress Notes (Signed)
What time did you schedule the TEE-cardioversion for? I will see if I can do it.

## 2018-02-17 NOTE — Progress Notes (Signed)
Primary Care Physician: Briscoe Deutscher, DO Referring Physician: Dr. Yates Decamp Samuel Willis is a 72 y.o. male with a h/o CAD, DM, bicuspid aortic valve, paroxysmal  afib, s/p Watchman in 2017 in the afib clinic for evaluation of afib with RVR since Sunday. No know trigger. This is the first afib episode that he has had for years.  Today, he denies symptoms of chest pain, shortness of breath, orthopnea, PND, lower extremity edema, dizziness, presyncope, syncope, or neurologic sequela.+ for palpitations and fatigue. The patient is tolerating medications without difficulties and is otherwise without complaint today.   Past Medical History:  Diagnosis Date  . Aortic aneurysm, thoracic (HCC)    bicuspid aortic valve. aortic stenosis, aortic regurg  . Asbestos exposure CT 06/2013   Noncalcified pleural plaques bilat; no signs of malignancy.  . Bicuspid aortic valve    aortic stenosis, aortic regurgitation; Dr. [name] plans to get an echo and MRA chest 12/2016  . CAD (coronary artery disease)    LAD dz but no corresponding ischemia on myoview  . Chronic renal insufficiency, stage III (moderate) (HCC) 06/2013   CrCl about 50 ml/min  . Confusion summer 2017   Eval by Dr. Erlinda Hong, neurology---suspected dx is impaired concentration/focus, but EEG being done to further r/o seizures.  . Diabetes mellitus without complication ([place])   . Fatty liver 04/2012; 06/2013   Noted on noncontrast abd CT done during trauma w/u when tractor rolled onto his knee.  Also noted on noncontrast chest CT done to screen for asbestos lung damage.  Marland Kitchen GERD (gastroesophageal reflux disease)   . H/O hiatal hernia   . Heart murmur   . Hyperlipidemia   . Hypertension   . Nephrolithiasis   . Paroxysmal atrial fibrillation (HCC)    ASA 325 + plavix as of 01/06/16;  electrophys did placement of Watchman left atrial appendage occlusive device 10/2015.  Marland Kitchen Prediabetes 2015   A1c 6.4%.  A1c 6.1% 08/2015   Past Surgical History:    Procedure Laterality Date  . CARDIAC CATHETERIZATION  06/24/10  . CIRCUMCISION  01/18/2012   Procedure: CIRCUMCISION ADULT;  Surgeon: [name], MD;  Location: [facility];  Service: Urology;  Laterality: N/A;  30 mins requested for this case   . COLONOSCOPY    . EEG  01/2016   NORMAL ([facility])  . ESOPHAGOGASTRODUODENOSCOPY  02/23/2011   Barrett's esophagus, hiatal hernia - dilated for dysphagia  . Implantable loop recorder placement  06/29/14   MDT LINQ implanted by Dr [name] in the office as part of the RIO II protocol. + A fib confirmation.  Marland Kitchen LEFT ATRIAL APPENDAGE OCCLUSION N/A 10/31/2015   Procedure: LEFT ATRIAL APPENDAGE OCCLUSION;  Surgeon: [name], MD;  Location: [facility];  Service: Cardiovascular;  Laterality: N/A;  . LEFT ATRIAL APPENDAGE OCCLUSION    . SHOULDER SURGERY Right    Arthroscopic 02/23/12  . TEE WITHOUT CARDIOVERSION N/A 07/25/2015   Dr. [name]: EF 60-65%, normal LV wall motion, functionally bicuspid aortic valve with mild AI and mild AS, ascending aorta dilated to 4 cm, left atrial appendage measured for Watchman procedure/device  . TEE WITHOUT CARDIOVERSION N/A 12/27/2015   Procedure: TRANSESOPHAGEAL ECHOCARDIOGRAM (TEE);  Surgeon: Dorothy Spark, MD;  Location: [facility];  Service: Cardiovascular;  Laterality: N/A;  . TENDON REPAIR  11/06/2011   Procedure: TENDON REPAIR;  Surgeon: Cammie Sickle., MD;  Location: [facility];  Service: Orthopedics;  Laterality: Left;  explore/repair tendons left hand   . WATCHMAN PLACEMENT  10/31/15   Left atrial appendage occlusive device     Current Outpatient Medications  Medication Sig Dispense Refill  . amLODipine (NORVASC) 10 MG tablet TAKE 1 TABLET BY MOUTH ONCE DAILY 90 tablet 1  . aspirin 325 MG tablet Take 325 mg by mouth daily.    Marland Kitchen atorvastatin (LIPITOR) 40 MG tablet Take 1 tablet (40 mg total) by mouth daily. 90 tablet 3  . benazepril (LOTENSIN) 40 MG  tablet Take 1 tablet (40 mg total) by mouth daily. 30 tablet 3  . metFORMIN (GLUCOPHAGE-XR) 500 MG 24 hr tablet TAKE 2 TABLETS BY MOUTH ONCE DAILY WITH BREAKFAST 180 tablet 1  . omeprazole (PRILOSEC) 40 MG capsule TAKE 1 CAPSULE BY MOUTH ONCE DAILY. 90 capsule 3  . apixaban (ELIQUIS) 5 MG TABS tablet Take 1 tablet (5 mg total) by mouth 2 (two) times daily. 60 tablet 2   No current facility-administered medications for this encounter.     No Known Allergies  Social History   Socioeconomic History  . Marital status: Married    Spouse name: Not on file  . Number of children: 2  . Years of education: Not on file  . Highest education level: Not on file  Occupational History  . Occupation: Self employed    Employer: SELF EMPLOYED  Social Needs  . Financial resource strain: Not on file  . Food insecurity:    Worry: Not on file    Inability: Not on file  . Transportation needs:    Medical: Not on file    Non-medical: Not on file  Tobacco Use  . Smoking status: Former Smoker    Packs/day: 0.50    Years: 10.00    Pack years: 5.00    Types: Cigarettes    Last attempt to quit: 11/03/1993    Years since quitting: 24.3  . Smokeless tobacco: Never Used  Substance and Sexual Activity  . Alcohol use: No  . Drug use: No  . Sexual activity: Yes  Lifestyle  . Physical activity:    Days per week: Not on file    Minutes per session: Not on file  . Stress: Not on file  Relationships  . Social connections:    Talks on phone: Not on file    Gets together: Not on file    Attends religious service: Not on file    Active member of club or organization: Not on file    Attends meetings of clubs or organizations: Not on file    Relationship status: Not on file  . Intimate partner violence:    Fear of current or ex partner: Not on file    Emotionally abused: Not on file    Physically abused: Not on file    Forced sexual activity: Not on file  Other Topics Concern  . Not on file  Social  History Narrative   Married, 2 daughters.   Orig from [place].   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   No T/A/Ds.           Family History  Problem Relation Age of Onset  . Arthritis Mother   . Diabetes Mother   . Stroke Mother        In her 67s  . Heart attack Father 24  . Asthma Father   . Emphysema Father   . Heart disease Father   . Hypertension  Father   . Hypertension Sister   . Hypertension Brother   . Hyperlipidemia Neg Hx   . Stomach cancer Neg Hx   . Rectal cancer Neg Hx   . Colon cancer Neg Hx   . Colon polyps Neg Hx   . Esophageal cancer Neg Hx     ROS- All systems are reviewed and negative except as per the HPI above  Physical Exam: Vitals:   02/16/18 1530  BP: 108/64  Pulse: (!) 129  Weight: 92.1 kg  Height: 5\' 11"  (1.803 m)   Wt Readings from Last 3 Encounters:  02/16/18 92.1 kg  02/02/18 91.6 kg  01/25/18 94.8 kg    Labs: Lab Results  Component Value Date   NA 144 02/16/2018   K 4.7 02/16/2018   CL 109 02/16/2018   CO2 25 02/16/2018   GLUCOSE 115 (H) 02/16/2018   BUN 19 02/16/2018   CREATININE 1.22 02/16/2018   CALCIUM 9.5 02/16/2018   Lab Results  Component Value Date   INR 1.2 (H) 04/18/2014   Lab Results  Component Value Date   CHOL 127 01/27/2018   HDL 41.20 01/27/2018   LDLCALC 69 01/27/2018   TRIG 80.0 01/27/2018     GEN- The patient is well appearing, alert and oriented x 3 today.   Head- normocephalic, atraumatic Eyes-  Sclera clear, conjunctiva pink Ears- hearing intact Oropharynx- clear Neck- supple, no JVP Lymph- no cervical lymphadenopathy Lungs- Clear to ausculation bilaterally, normal work of breathing Heart- Rapid irregular rate and rhythm, no murmurs, rubs or gallops, PMI not laterally displaced GI- soft, NT, ND, + BS Extremities- no clubbing, cyanosis, or edema MS- no significant deformity or atrophy Skin- no rash or lesion Psych- euthymic  mood, full affect Neuro- strength and sensation are intact  EKG-afib at 129 bpm, IRBBB Epic records reviewe    Assessment and Plan: 1. Paroxysmal afib  Present since Sunday with RVR BP on lower side for pt around 100 sys He will hold amlodipine and benazepril and add metoprolol tartrate 25 mg 1/2 tab bid for rate control  2. Chadsvasc score of 3 S/p Watchman in 2017  Discussed with Dr. Lamount Cohen He will start back on Eliquis 5 mg bid in order to pursue cardioversion He does not want to wait the 3 weeks required anticoagulation period He is agreeable to TEE/cardioversion scheduled for next Tuesday  He is aware that he will need to continue 4 weeks after cardioversion We also discussed if this happens again off anticoagulation, he has 48 hours to be cardioverted in the ER   F/u in afib clinic in one week  Butch Penny C. Clare Fennimore, [facility] [address] [place], [place] [address] [phone]

## 2018-02-18 ENCOUNTER — Ambulatory Visit (HOSPITAL_COMMUNITY)
Admission: RE | Admit: 2018-02-18 | Discharge: 2018-02-18 | Disposition: A | Discharge status: Home / Self Care | Payer: Medicare Other | Source: Ambulatory Visit | Attending: Nurse Practitioner | Admitting: Nurse Practitioner

## 2018-02-18 DIAGNOSIS — I4891 Unspecified atrial fibrillation: Secondary | ICD-10-CM | POA: Insufficient documentation

## 2018-02-18 NOTE — Progress Notes (Addendum)
Pt in for repeat EKG.  To be reviewed by Ceasar Lund  Pt asked to return to office for EKG as he felt that he had returned to SR. Ekg showed NSR, normal ekg. Cardioversion cancelled. He will stay on metoprolol 25 mg 1/2 tab bid, hold off on amlodipine and return to full dose of benazepril. He will stay on xarelto for now until sure  he will stay in rhythm. He will f/u with Dr. [name] in 2 weeks. Can probably stop anticoagulation at that point if continues in SR.

## 2018-02-18 NOTE — Patient Instructions (Addendum)
Stop amlodipine (norvasc) Continue metoprolol 1/2 tab twice a day Tomorrow resume full dose of benazepril  Continue eliquis for 2 weeks until you see Dr. [name]

## 2018-02-22 ENCOUNTER — Ambulatory Visit (HOSPITAL_COMMUNITY): Admission: RE | Admit: 2018-02-22 | Payer: Medicare Other | Source: Ambulatory Visit | Admitting: Cardiology

## 2018-02-22 ENCOUNTER — Encounter (HOSPITAL_COMMUNITY): Admission: RE | Payer: Self-pay | Source: Ambulatory Visit

## 2018-02-22 SURGERY — CARDIOVERSION
Anesthesia: General

## 2018-03-01 ENCOUNTER — Ambulatory Visit (HOSPITAL_COMMUNITY): Payer: Medicare Other | Admitting: Nurse Practitioner

## 2018-03-07 DIAGNOSIS — R0683 Snoring: Secondary | ICD-10-CM | POA: Diagnosis not present

## 2018-03-16 ENCOUNTER — Ambulatory Visit (INDEPENDENT_AMBULATORY_CARE_PROVIDER_SITE_OTHER): Payer: Medicare Other | Admitting: Family Medicine

## 2018-03-16 ENCOUNTER — Encounter: Payer: Self-pay | Admitting: Family Medicine

## 2018-03-16 VITALS — BP 112/70 | HR 66 | Temp 98.2°F | Ht 71.0 in | Wt 200.6 lb

## 2018-03-16 DIAGNOSIS — R059 Cough, unspecified: Secondary | ICD-10-CM

## 2018-03-16 DIAGNOSIS — R05 Cough: Secondary | ICD-10-CM

## 2018-03-16 DIAGNOSIS — I480 Paroxysmal atrial fibrillation: Secondary | ICD-10-CM | POA: Diagnosis not present

## 2018-03-16 MED ORDER — AZITHROMYCIN 250 MG PO TABS: ORAL_TABLET | ORAL | 0 refills | Status: DC

## 2018-03-16 MED ORDER — METOPROLOL SUCCINATE ER 25 MG PO TB24: 25.0000 mg | ORAL_TABLET | Freq: Every day | ORAL | 2 refills | Status: DC

## 2018-03-16 MED ORDER — BENZONATATE 200 MG PO CAPS: 200.0000 mg | ORAL_CAPSULE | Freq: Two times a day (BID) | ORAL | 0 refills | Status: DC | PRN

## 2018-03-16 NOTE — Progress Notes (Signed)
Samuel Willis is a 72 y.o. male here for an acute visit.  History of Present Illness:   Cough  This is a new problem. The current episode started 1 to 4 weeks ago. The problem has been waxing and waning. The cough is non-productive. Associated symptoms include nasal congestion and postnasal drip. Pertinent negatives include no chest pain, chills, fever, myalgias, sore throat, shortness of breath or wheezing. The symptoms are aggravated by lying down. He has tried nothing for the symptoms.   Paroxysmal Atrial Fibrillation Converted on his own. Began Eliquis and Metoprolol. Metoprolol making tired.   PMHx, SurgHx, SocialHx, Medications, and Allergies were reviewed in the Visit Navigator and updated as appropriate.  Current Medications:   .  apixaban (ELIQUIS) 5 MG TABS tablet, Take 1 tablet (5 mg total) by mouth 2 (two) times daily., Disp: 60 tablet, Rfl: 2 .  aspirin 325 MG tablet, Take 325 mg by mouth daily., Disp: , Rfl:  .  atorvastatin (LIPITOR) 40 MG tablet, Take 1 tablet (40 mg total) by mouth daily., Disp: 90 tablet, Rfl: 3 .  benazepril (LOTENSIN) 40 MG tablet, Take 1 tablet (40 mg total) by mouth daily., Disp: 30 tablet, Rfl: 3 .  metFORMIN (GLUCOPHAGE-XR) 500 MG 24 hr tablet, TAKE 2 TABLETS BY MOUTH ONCE DAILY WITH BREAKFAST (Patient taking differently: Take 1,000 mg by mouth daily with breakfast. ), Disp: 180 tablet, Rfl: 1 .  metoprolol tartrate (LOPRESSOR) 25 MG tablet, Take 12.5 mg by mouth 2 (two) times daily., Disp: , Rfl:  .  omeprazole (PRILOSEC) 40 MG capsule, TAKE 1 CAPSULE BY MOUTH ONCE DAILY. (Patient taking differently: Take 40 mg by mouth daily. ), Disp: 90 capsule, Rfl: 3   No Known Allergies   Review of Systems:   Pertinent items are noted in the HPI. Otherwise, ROS is negative.  Vitals:   Vitals:   03/16/18 1419  BP: 112/70  Pulse: 66  Temp: 98.2 F (36.8 C)  TempSrc: Oral  SpO2: 97%  Weight: 200 lb 9.6 oz (91 kg)  Height: 5\' 11"  (1.803 m)       Body mass index is 27.98 kg/m.  Physical Exam:   Physical Exam  Constitutional: He is oriented to person, place, and time. He appears well-developed and well-nourished. No distress.  HENT:  Head: Normocephalic and atraumatic.  Right Ear: External ear normal.  Left Ear: External ear normal.  Nose: Nose normal.  Mouth/Throat: Oropharynx is clear and moist.  Eyes: Pupils are equal, round, and reactive to light. Conjunctivae and EOM are normal.  Neck: Normal range of motion. Neck supple.  Cardiovascular: Normal rate, regular rhythm and intact distal pulses.  Murmur heard.  Systolic murmur is present. Pulmonary/Chest: Effort normal. He has no wheezes. He has rhonchi.  Abdominal: Soft. Bowel sounds are normal.  Musculoskeletal: Normal range of motion.  Neurological: He is alert and oriented to person, place, and time.  Skin: Skin is warm and dry.  Psychiatric: He has a normal mood and affect. His behavior is normal. Judgment and thought content normal.  Nursing note and vitals reviewed.  Assessment and Plan:   Diagnoses and all orders for this visit:  Cough -     azithromycin (ZITHROMAX) 250 MG tablet; 2 po on day one, then one po each day until gone -     benzonatate (TESSALON) 200 MG capsule; Take 1 capsule (200 mg total) by mouth 2 (two) times daily as needed for cough.  Paroxysmal atrial fibrillation (HCC) Comments: Change  to XL. Pills are scored. Current dose is making him very tired. Okay to try 1/2 dose and monitor. Orders: -     metoprolol succinate (TOPROL-XL) 25 MG 24 hr tablet; Take 1 tablet (25 mg total) by mouth daily.   . Reviewed expectations re: course of current medical issues. . Discussed self-management of symptoms. . Outlined signs and symptoms indicating need for more acute intervention. . Patient verbalized understanding and all questions were answered. Marland Kitchen Health Maintenance issues including appropriate healthy diet, exercise, and smoking avoidance were  discussed with patient. . See orders for this visit as documented in the electronic medical record. . Patient received an After Visit Summary.  Briscoe Deutscher, DO [place] 03/17/2018

## 2018-03-18 ENCOUNTER — Other Ambulatory Visit (HOSPITAL_COMMUNITY): Payer: Medicare Other

## 2018-03-18 ENCOUNTER — Encounter (HOSPITAL_COMMUNITY): Payer: Medicare Other | Admitting: Cardiology

## 2018-03-20 ENCOUNTER — Other Ambulatory Visit: Payer: Self-pay | Admitting: Family Medicine

## 2018-03-20 DIAGNOSIS — R05 Cough: Secondary | ICD-10-CM

## 2018-03-20 DIAGNOSIS — R059 Cough, unspecified: Secondary | ICD-10-CM

## 2018-03-21 ENCOUNTER — Telehealth: Payer: Self-pay | Admitting: Family Medicine

## 2018-03-21 DIAGNOSIS — R05 Cough: Secondary | ICD-10-CM

## 2018-03-21 DIAGNOSIS — R059 Cough, unspecified: Secondary | ICD-10-CM

## 2018-03-21 MED ORDER — AZITHROMYCIN 250 MG PO TABS: ORAL_TABLET | ORAL | 0 refills | Status: DC

## 2018-03-21 NOTE — Telephone Encounter (Signed)
Spoke with patient and he finished up the Z pac yesterday. He said that he has just started coughing up the stuff from his chest today. He would like a couple more days worth of antibiotic so he does not have a relapse.

## 2018-03-21 NOTE — Addendum Note (Signed)
Addended by: Durwin Glaze on: 03/21/2018 09:46 AM   Modules accepted: Orders

## 2018-03-21 NOTE — Telephone Encounter (Signed)
MEDICATION: last antibotic  PHARMACY:   [place], [place] - [address] [phone] (Phone) [phone] (Fax)    IS THIS A 90 DAY SUPPLY : no  IS PATIENT OUT OF MEDICATION: yes  IF NOT; HOW MUCH IS LEFT:   LAST APPOINTMENT DATE: @10 /11/2017  NEXT APPOINTMENT DATE:@2 /21/2020  OTHER COMMENTS:    **Let patient know to contact pharmacy at the end of the day to make sure medication is ready. **  ** Please notify patient to allow 48-72 hours to process**  **Encourage patient to contact the pharmacy for refills or they can request refills through [facility]**

## 2018-03-21 NOTE — Telephone Encounter (Signed)
Okay refill. 

## 2018-03-21 NOTE — Telephone Encounter (Signed)
Prescription sent in and patient notified. 

## 2018-04-05 DIAGNOSIS — H65191 Other acute nonsuppurative otitis media, right ear: Secondary | ICD-10-CM | POA: Diagnosis not present

## 2018-04-05 DIAGNOSIS — H6983 Other specified disorders of Eustachian tube, bilateral: Secondary | ICD-10-CM | POA: Insufficient documentation

## 2018-04-05 DIAGNOSIS — H6993 Unspecified Eustachian tube disorder, bilateral: Secondary | ICD-10-CM | POA: Insufficient documentation

## 2018-04-06 DIAGNOSIS — H903 Sensorineural hearing loss, bilateral: Secondary | ICD-10-CM | POA: Insufficient documentation

## 2018-04-06 DIAGNOSIS — H6983 Other specified disorders of Eustachian tube, bilateral: Secondary | ICD-10-CM | POA: Diagnosis not present

## 2018-04-06 HISTORY — PX: MYRINGOTOMY: SUR874

## 2018-04-08 ENCOUNTER — Encounter (HOSPITAL_COMMUNITY): Payer: Self-pay | Admitting: *Deleted

## 2018-04-08 ENCOUNTER — Other Ambulatory Visit (HOSPITAL_COMMUNITY): Payer: Self-pay | Admitting: *Deleted

## 2018-04-08 ENCOUNTER — Ambulatory Visit (HOSPITAL_COMMUNITY)
Admission: RE | Admit: 2018-04-08 | Discharge: 2018-04-08 | Disposition: A | Discharge status: Home / Self Care | Payer: Medicare Other | Source: Ambulatory Visit | Attending: Cardiology | Admitting: Cardiology

## 2018-04-08 ENCOUNTER — Ambulatory Visit (HOSPITAL_BASED_OUTPATIENT_CLINIC_OR_DEPARTMENT_OTHER)
Admission: RE | Admit: 2018-04-08 | Discharge: 2018-04-08 | Disposition: A | Discharge status: Home / Self Care | Payer: Medicare Other | Source: Ambulatory Visit | Attending: Cardiology | Admitting: Cardiology

## 2018-04-08 VITALS — BP 137/78 | HR 59 | Wt 201.4 lb

## 2018-04-08 DIAGNOSIS — I480 Paroxysmal atrial fibrillation: Secondary | ICD-10-CM | POA: Insufficient documentation

## 2018-04-08 DIAGNOSIS — Q231 Congenital insufficiency of aortic valve: Secondary | ICD-10-CM | POA: Insufficient documentation

## 2018-04-08 DIAGNOSIS — I350 Nonrheumatic aortic (valve) stenosis: Secondary | ICD-10-CM

## 2018-04-08 DIAGNOSIS — Z7984 Long term (current) use of oral hypoglycemic drugs: Secondary | ICD-10-CM | POA: Diagnosis not present

## 2018-04-08 DIAGNOSIS — I2510 Atherosclerotic heart disease of native coronary artery without angina pectoris: Secondary | ICD-10-CM

## 2018-04-08 DIAGNOSIS — E1122 Type 2 diabetes mellitus with diabetic chronic kidney disease: Secondary | ICD-10-CM | POA: Diagnosis not present

## 2018-04-08 DIAGNOSIS — I129 Hypertensive chronic kidney disease with stage 1 through stage 4 chronic kidney disease, or unspecified chronic kidney disease: Secondary | ICD-10-CM | POA: Diagnosis not present

## 2018-04-08 DIAGNOSIS — Z823 Family history of stroke: Secondary | ICD-10-CM | POA: Diagnosis not present

## 2018-04-08 DIAGNOSIS — R0609 Other forms of dyspnea: Secondary | ICD-10-CM | POA: Diagnosis not present

## 2018-04-08 DIAGNOSIS — I10 Essential (primary) hypertension: Secondary | ICD-10-CM | POA: Diagnosis not present

## 2018-04-08 DIAGNOSIS — N189 Chronic kidney disease, unspecified: Secondary | ICD-10-CM | POA: Insufficient documentation

## 2018-04-08 DIAGNOSIS — Z8249 Family history of ischemic heart disease and other diseases of the circulatory system: Secondary | ICD-10-CM | POA: Diagnosis not present

## 2018-04-08 DIAGNOSIS — I712 Thoracic aortic aneurysm, without rupture: Secondary | ICD-10-CM | POA: Diagnosis not present

## 2018-04-08 DIAGNOSIS — Z79899 Other long term (current) drug therapy: Secondary | ICD-10-CM | POA: Insufficient documentation

## 2018-04-08 DIAGNOSIS — R0789 Other chest pain: Secondary | ICD-10-CM

## 2018-04-08 DIAGNOSIS — R0600 Dyspnea, unspecified: Secondary | ICD-10-CM

## 2018-04-08 DIAGNOSIS — E785 Hyperlipidemia, unspecified: Secondary | ICD-10-CM | POA: Diagnosis not present

## 2018-04-08 DIAGNOSIS — Z7901 Long term (current) use of anticoagulants: Secondary | ICD-10-CM | POA: Diagnosis not present

## 2018-04-08 LAB — CBC
HCT: 46.7 % (ref 39.0–52.0)
Hemoglobin: 15.3 g/dL (ref 13.0–17.0)
MCH: 31.2 pg (ref 26.0–34.0)
MCHC: 32.8 g/dL (ref 30.0–36.0)
MCV: 95.3 fL (ref 80.0–100.0)
NRBC: 0 % (ref 0.0–0.2)
PLATELETS: 256 10*3/uL (ref 150–400)
RBC: 4.9 MIL/uL (ref 4.22–5.81)
RDW: 12 % (ref 11.5–15.5)
WBC: 6.2 10*3/uL (ref 4.0–10.5)

## 2018-04-08 LAB — BASIC METABOLIC PANEL
ANION GAP: 9 (ref 5–15)
BUN: 16 mg/dL (ref 8–23)
CALCIUM: 9.3 mg/dL (ref 8.9–10.3)
CO2: 23 mmol/L (ref 22–32)
Chloride: 109 mmol/L (ref 98–111)
Creatinine, Ser: 1.04 mg/dL (ref 0.61–1.24)
Glucose, Bld: 115 mg/dL — ABNORMAL HIGH (ref 70–99)
Potassium: 4.3 mmol/L (ref 3.5–5.1)
SODIUM: 141 mmol/L (ref 135–145)

## 2018-04-08 LAB — PROTIME-INR
INR: 1.28
Prothrombin Time: 15.9 seconds — ABNORMAL HIGH (ref 11.4–15.2)

## 2018-04-08 MED ORDER — METOPROLOL SUCCINATE ER 25 MG PO TB24: 12.5000 mg | ORAL_TABLET | Freq: Every day | ORAL | 2 refills | Status: DC

## 2018-04-08 NOTE — Patient Instructions (Addendum)
Your physician is scheduling your for a Left Heart Cath.  See instruction sheet.  CHANGE dose of Metoprolol to 12.5mg  (1/2 tab) daily  Your physician recommends that you schedule a follow-up appointment in: 2 weeks with APP for post procedure follow up/

## 2018-04-08 NOTE — Progress Notes (Signed)
  Echocardiogram 2D Echocardiogram has been performed.  Samuel Willis 04/08/2018, 11:56 AM

## 2018-04-10 NOTE — Addendum Note (Signed)
Encounter addended by: [name], MD on: 04/10/2018 11:42 PM  Actions taken: Sign clinical note

## 2018-04-10 NOTE — H&P (View-Only) (Signed)
Patient ID: Samuel Willis, male   DOB: 1945-07-24, 72 y.o.   MRN: [id number] PCP: Dr. Anitra Lauth Cardiology: Dr. [name]  72 y.o. with history of bicuspid aortic valve and mild-moderate AS/mild AI on last echo in 6/18 as well as paroxysmal atrial fibrillation presents for followup. He had a Watchman device placed in 5/17. Cardiolite in 8/18 showed no evidence for ischemia/infarction.   He had recurrent atrial fibrillation in 9/19, went back into NSR spontaneously after 4-5 days.  He started apixaban in case he needed DCCV and has stayed on it (had not been willing to take in the past). Weight is down 9 lbs since last appointment.   Echo (10/19) showed EF 60-65%, mild AS, mild AI with bicuspid aortic valve.   Patient returns for followup of aortic valve disease and atrial fibrillation.  He has not had chest pain, but has had increasing exertional dyspnea over time.  He especially noted dyspnea with slow jogging.  He tries to walk 3 miles at a time.  No lightheadedness.  No recent palpitations, he is in NSR today.   ECG (9/19, personally reviewed): NSR, normal.  Labs (12/10): creatinine 1.4  Labs (6/12): K 4.5, creatinine 1.2, LDL 66, HDL 48 Labs (7/14): K 4.5, creatinine 1.3, LDL 71, HDL 49 Labs (1/15): K 4.8, creatinine 1.5, LDL 58, HDL 36 Labs (2/16): K 4.5, creatinine 1.17, LDL 83, HDL 42, TSH normal Labs (3/17): LDL 72, HDL 44 Labs (5/17): K 4.1, creatinine 1.2 Labs (4/18): LDL 92, HDL 37 Labs (8/18): K 5.1, creatinine 1.27, LDL 55, HDL 42 Labs (8/19): LDL 69, HDL 41 Labs (9/19): TSH normal, K 4.7, creatinine 1.22  Allergies (verified):  No Known Drug Allergies   Past Medical History:  1.  Atrial fibrillation: Paroxysmal. Patient has a brief episode of irregular heart beating every 1-2 years. One was apparently documented as atrial fibrillation years ago in [employer]. His CHADSVASC score is 3 now that CAD has been found. LINQ monitor was placed and has documented paroxysmal atrial  fibrillation. - S/p Watchman placement in 5/17.  2. Hypertension.  3. Hyperlipidemia.  4. CAD: Exercise treadmill Myoview done in January 2010. The patient exercised for 10 minutes and 46 seconds. He stopped due to fatigue. EF was 56%. There was normal perfusion with no evidence for ischemia or infarction. LHC (1/12): 60-70% mLAD stenosis.  ETT-myoview was then done to assess for ischemia in the LAD territory. Patient exercised 10', EF was 69% with no evidence for ischemia or infarction.   - ETT-Cardiolite (8/18): EF 56%, 10 minutes exercise, no evidence for ischemia/infarction (normal).  5. Aortic stenosis and aortic regurgitation (bicuspid aortic valve). The patient did have an echocardiogram done on June 26, 2008. EF was 65%. There were no regional wall motion abnormalities. There was moderate aortic regurgitation. There was mild aortic stenosis by mean gradient which was 12 mmHg. There was mild ascending aorta and aortic root dilation. There was mild mitral regurgitation, mild left atrial enlargement, and right atrial enlargement. TEE was done, confirming a functionally bicuspid aortic valve with fusion of the right and noncoronary cusps. There was mild aortic stenosis and moderate aortic insufficiency. The ascending aorta was mildly dilated at 3.9 cm. TTE (1/10) showed EF 60% with mild LVH, mild AS (mean gradient 12), mild to moderate AR, normal LV size. TTE (1/12) with EF 60%, mild LVH, moderate diastolic dysfunction, mild AS (mean gradient 11 mmHg), mild AI, normal RV. MRA chest (1/12) with 3.7 cm aortic root and ascending  aorta. TTE (2/13): EF 60-65%, mild LVH, mild AS, mild AI, mild MR.  Echo (2/15) with EF 60-65%, bicuspid aortic valve with mild aortic stenosis and mild to moderate AI, mild MR.  MRA chest (2/15) with 4.1 cm aortic root and 4.1 cm ascending aorta.  Echo (4/16) with EF 60-65%, mild AS mean gradient 16 mmHg.   - TEE (7/17) with EF 60-65%, bicuspid aortic valve with mild to moderate  AS, moderate AI.  - Echo (6/18): EF 60-65%, moderate LVH, bicuspid aortic valve with mild to moderate AS (mean gradient 12, AVA 1.75 cm^2), mild AI, ascending aorta 4.1 cm.  - MRA chest (6/18): Ascending aorta 3.8 cm - Echo (10/19): EF 60-65%, mild LVH, mild AS, mild AI, bicuspid aortic valve.  6. Biceps tendon rupture.  7. CKD 8. Ascending aortic aneurysm: TEE (2/17) with 4.0 cm ascending aorta.  9. Type II diabetes  Family History:  The patient's mother has a history of stroke, also has questionable history of atrial fibrillation. The patient's father had an MI in his 68s.   Social History:  The patient is married, lives in [place], is a nonsmoker. He owns several businesses. He is not drinking alcohol. He works out with a Clinical research associate a couple of days a week and gets a significant amount of cardiovascular exercise.   Review of Systems  All systems reviewed and negative except as per HPI.   Current Outpatient Medications  Medication Sig Dispense Refill  . amLODipine (NORVASC) 10 MG tablet Take 10 mg by mouth daily.    Marland Kitchen apixaban (ELIQUIS) 5 MG TABS tablet Take 1 tablet (5 mg total) by mouth 2 (two) times daily. 60 tablet 2  . atorvastatin (LIPITOR) 40 MG tablet Take 1 tablet (40 mg total) by mouth daily. 90 tablet 3  . benazepril (LOTENSIN) 40 MG tablet Take 40 mg by mouth daily.    . metFORMIN (GLUCOPHAGE-XR) 500 MG 24 hr tablet TAKE 2 TABLETS BY MOUTH ONCE DAILY WITH BREAKFAST (Patient taking differently: Take 1,000 mg by mouth daily with breakfast. ) 180 tablet 1  . metoprolol succinate (TOPROL-XL) 25 MG 24 hr tablet Take 0.5 tablets (12.5 mg total) by mouth daily. 15 tablet 2  . pantoprazole (PROTONIX) 40 MG tablet Take 40 mg by mouth 2 (two) times daily.     No current facility-administered medications for this encounter.     BP 137/78   Pulse (!) 59   Wt 91.4 kg (201 lb 6.4 oz)   SpO2 97%   BMI 28.09 kg/m  General: NAD Neck: No JVD, no thyromegaly or thyroid nodule.   Lungs: Clear to auscultation bilaterally with normal respiratory effort. CV: Nondisplaced PMI.  Heart regular S1/S2, no S3/S4, 2/6 early SEM RUSB.  No peripheral edema.  No carotid bruit.  Normal pedal pulses.  Abdomen: Soft, nontender, no hepatosplenomegaly, no distention.  Skin: Intact without lesions or rashes.  Neurologic: Alert and oriented x 3.  Psych: Normal affect. Extremities: No clubbing or cyanosis.  HEENT: Normal.   Assessment/Plan: 1. Bicuspid aortic valve disorder: Mild AS with mild AI on 10/19 echo. Minimal dilation of ascending aorta on 6/18 MRA (3.8 cm).   - Repeat echo in 2 years.   2. CAD: Moderate nonobstructive disease on 2012 cath.  Normal Cardiolite in 8/18.  He has exertional dyspnea but not chest pain.  The dyspnea has been slowly progressive. We discussed further evaluation.  He strongly favors cardiac cath given prior 60-70% LAD stenosis and worsening exertional dyspnea.  I  do not think that this would be unreasonable.  - I will arrange for coronary angiography.  We discussed risks/benefits and he agrees to procedure.  Hold Eliquis 2 days prior to cath.  - Continue statin.  - He is not on ASA as he is taking apixaban.   3. Hyperlipidemia: Good lipids in 8/19.    4. HTN: BP high.   - Continue amlodipine and benazepril.      5. Atrial fibrillation: Paroxysmal atrial fibrillation seen by [facility] monitor.  He is symptomatic when in atrial fibrillation, but episodes are short and infrequent.    - He has a Multimedia programmer.  - He recently started on apixaban.  Think this is reasonable to continue.     Followup post-cath.   [name] 04/10/2018

## 2018-04-10 NOTE — Progress Notes (Addendum)
Patient ID: Samuel Willis, male   DOB: July 31, 1945, 72 y.o.   MRN: [id number] PCP: Dr. Anitra Lauth Cardiology: Dr. [name]  72 y.o. with history of bicuspid aortic valve and mild-moderate AS/mild AI on last echo in 6/18 as well as paroxysmal atrial fibrillation presents for followup. He had a Watchman device placed in 5/17. Cardiolite in 8/18 showed no evidence for ischemia/infarction.   He had recurrent atrial fibrillation in 9/19, went back into NSR spontaneously after 4-5 days.  He started apixaban in case he needed DCCV and has stayed on it (had not been willing to take in the past). Weight is down 9 lbs since last appointment.   Echo (10/19) showed EF 60-65%, mild AS, mild AI with bicuspid aortic valve.   Patient returns for followup of aortic valve disease and atrial fibrillation.  He has not had chest pain, but has had increasing exertional dyspnea over time.  He especially noted dyspnea with slow jogging.  He tries to walk 3 miles at a time.  No lightheadedness.  No recent palpitations, he is in NSR today.   ECG (9/19, personally reviewed): NSR, normal.  Labs (12/10): creatinine 1.4  Labs (6/12): K 4.5, creatinine 1.2, LDL 66, HDL 48 Labs (7/14): K 4.5, creatinine 1.3, LDL 71, HDL 49 Labs (1/15): K 4.8, creatinine 1.5, LDL 58, HDL 36 Labs (2/16): K 4.5, creatinine 1.17, LDL 83, HDL 42, TSH normal Labs (3/17): LDL 72, HDL 44 Labs (5/17): K 4.1, creatinine 1.2 Labs (4/18): LDL 92, HDL 37 Labs (8/18): K 5.1, creatinine 1.27, LDL 55, HDL 42 Labs (8/19): LDL 69, HDL 41 Labs (9/19): TSH normal, K 4.7, creatinine 1.22  Allergies (verified):  No Known Drug Allergies   Past Medical History:  1.  Atrial fibrillation: Paroxysmal. Patient has a brief episode of irregular heart beating every 1-2 years. One was apparently documented as atrial fibrillation years ago in [employer]. His CHADSVASC score is 3 now that CAD has been found. LINQ monitor was placed and has documented paroxysmal atrial  fibrillation. - S/p Watchman placement in 5/17.  2. Hypertension.  3. Hyperlipidemia.  4. CAD: Exercise treadmill Myoview done in January 2010. The patient exercised for 10 minutes and 46 seconds. He stopped due to fatigue. EF was 56%. There was normal perfusion with no evidence for ischemia or infarction. LHC (1/12): 60-70% mLAD stenosis.  ETT-myoview was then done to assess for ischemia in the LAD territory. Patient exercised 10', EF was 69% with no evidence for ischemia or infarction.   - ETT-Cardiolite (8/18): EF 56%, 10 minutes exercise, no evidence for ischemia/infarction (normal).  5. Aortic stenosis and aortic regurgitation (bicuspid aortic valve). The patient did have an echocardiogram done on June 26, 2008. EF was 65%. There were no regional wall motion abnormalities. There was moderate aortic regurgitation. There was mild aortic stenosis by mean gradient which was 12 mmHg. There was mild ascending aorta and aortic root dilation. There was mild mitral regurgitation, mild left atrial enlargement, and right atrial enlargement. TEE was done, confirming a functionally bicuspid aortic valve with fusion of the right and noncoronary cusps. There was mild aortic stenosis and moderate aortic insufficiency. The ascending aorta was mildly dilated at 3.9 cm. TTE (1/10) showed EF 60% with mild LVH, mild AS (mean gradient 12), mild to moderate AR, normal LV size. TTE (1/12) with EF 60%, mild LVH, moderate diastolic dysfunction, mild AS (mean gradient 11 mmHg), mild AI, normal RV. MRA chest (1/12) with 3.7 cm aortic root and ascending  aorta. TTE (2/13): EF 60-65%, mild LVH, mild AS, mild AI, mild MR.  Echo (2/15) with EF 60-65%, bicuspid aortic valve with mild aortic stenosis and mild to moderate AI, mild MR.  MRA chest (2/15) with 4.1 cm aortic root and 4.1 cm ascending aorta.  Echo (4/16) with EF 60-65%, mild AS mean gradient 16 mmHg.   - TEE (7/17) with EF 60-65%, bicuspid aortic valve with mild to moderate  AS, moderate AI.  - Echo (6/18): EF 60-65%, moderate LVH, bicuspid aortic valve with mild to moderate AS (mean gradient 12, AVA 1.75 cm^2), mild AI, ascending aorta 4.1 cm.  - MRA chest (6/18): Ascending aorta 3.8 cm - Echo (10/19): EF 60-65%, mild LVH, mild AS, mild AI, bicuspid aortic valve.  6. Biceps tendon rupture.  7. CKD 8. Ascending aortic aneurysm: TEE (2/17) with 4.0 cm ascending aorta.  9. Type II diabetes  Family History:  The patient's mother has a history of stroke, also has questionable history of atrial fibrillation. The patient's father had an MI in his 22s.   Social History:  The patient is married, lives in [place], is a nonsmoker. He owns several businesses. He is not drinking alcohol. He works out with a Clinical research associate a couple of days a week and gets a significant amount of cardiovascular exercise.   Review of Systems  All systems reviewed and negative except as per HPI.   Current Outpatient Medications  Medication Sig Dispense Refill  . amLODipine (NORVASC) 10 MG tablet Take 10 mg by mouth daily.    Marland Kitchen apixaban (ELIQUIS) 5 MG TABS tablet Take 1 tablet (5 mg total) by mouth 2 (two) times daily. 60 tablet 2  . atorvastatin (LIPITOR) 40 MG tablet Take 1 tablet (40 mg total) by mouth daily. 90 tablet 3  . benazepril (LOTENSIN) 40 MG tablet Take 40 mg by mouth daily.    . metFORMIN (GLUCOPHAGE-XR) 500 MG 24 hr tablet TAKE 2 TABLETS BY MOUTH ONCE DAILY WITH BREAKFAST (Patient taking differently: Take 1,000 mg by mouth daily with breakfast. ) 180 tablet 1  . metoprolol succinate (TOPROL-XL) 25 MG 24 hr tablet Take 0.5 tablets (12.5 mg total) by mouth daily. 15 tablet 2  . pantoprazole (PROTONIX) 40 MG tablet Take 40 mg by mouth 2 (two) times daily.     No current facility-administered medications for this encounter.     BP 137/78   Pulse (!) 59   Wt 91.4 kg (201 lb 6.4 oz)   SpO2 97%   BMI 28.09 kg/m  General: NAD Neck: No JVD, no thyromegaly or thyroid nodule.   Lungs: Clear to auscultation bilaterally with normal respiratory effort. CV: Nondisplaced PMI.  Heart regular S1/S2, no S3/S4, 2/6 early SEM RUSB.  No peripheral edema.  No carotid bruit.  Normal pedal pulses.  Abdomen: Soft, nontender, no hepatosplenomegaly, no distention.  Skin: Intact without lesions or rashes.  Neurologic: Alert and oriented x 3.  Psych: Normal affect. Extremities: No clubbing or cyanosis.  HEENT: Normal.   Assessment/Plan: 1. Bicuspid aortic valve disorder: Mild AS with mild AI on 10/19 echo. Minimal dilation of ascending aorta on 6/18 MRA (3.8 cm).   - Repeat echo in 2 years.   2. CAD: Moderate nonobstructive disease on 2012 cath.  Normal Cardiolite in 8/18.  He has exertional dyspnea but not chest pain.  The dyspnea has been slowly progressive. We discussed further evaluation.  He strongly favors cardiac cath given prior 60-70% LAD stenosis and worsening exertional dyspnea.  I  do not think that this would be unreasonable.  - I will arrange for coronary angiography.  We discussed risks/benefits and he agrees to procedure.  Hold Eliquis 2 days prior to cath.  - Continue statin.  - He is not on ASA as he is taking apixaban.   3. Hyperlipidemia: Good lipids in 8/19.    4. HTN: BP high.   - Continue amlodipine and benazepril.      5. Atrial fibrillation: Paroxysmal atrial fibrillation seen by [facility] monitor.  He is symptomatic when in atrial fibrillation, but episodes are short and infrequent.    - He has a Multimedia programmer.  - He recently started on apixaban.  Think this is reasonable to continue.     Followup post-cath.   [name] 04/10/2018

## 2018-04-13 ENCOUNTER — Encounter (HOSPITAL_COMMUNITY): Payer: Self-pay | Admitting: Cardiology

## 2018-04-13 ENCOUNTER — Ambulatory Visit (HOSPITAL_COMMUNITY)
Admission: RE | Admit: 2018-04-13 | Discharge: 2018-04-13 | Disposition: A | Discharge status: Home / Self Care | Payer: Medicare Other | Source: Ambulatory Visit | Attending: Cardiology | Admitting: Cardiology

## 2018-04-13 ENCOUNTER — Encounter (HOSPITAL_COMMUNITY)
Admission: RE | Disposition: A | Discharge status: Home / Self Care | Payer: Self-pay | Source: Ambulatory Visit | Attending: Cardiology

## 2018-04-13 ENCOUNTER — Other Ambulatory Visit: Payer: Self-pay

## 2018-04-13 DIAGNOSIS — E1122 Type 2 diabetes mellitus with diabetic chronic kidney disease: Secondary | ICD-10-CM | POA: Diagnosis not present

## 2018-04-13 DIAGNOSIS — I712 Thoracic aortic aneurysm, without rupture: Secondary | ICD-10-CM | POA: Diagnosis not present

## 2018-04-13 DIAGNOSIS — Z7984 Long term (current) use of oral hypoglycemic drugs: Secondary | ICD-10-CM | POA: Insufficient documentation

## 2018-04-13 DIAGNOSIS — Z823 Family history of stroke: Secondary | ICD-10-CM | POA: Insufficient documentation

## 2018-04-13 DIAGNOSIS — Q231 Congenital insufficiency of aortic valve: Secondary | ICD-10-CM | POA: Diagnosis not present

## 2018-04-13 DIAGNOSIS — R0789 Other chest pain: Secondary | ICD-10-CM

## 2018-04-13 DIAGNOSIS — I480 Paroxysmal atrial fibrillation: Secondary | ICD-10-CM | POA: Diagnosis not present

## 2018-04-13 DIAGNOSIS — Z7901 Long term (current) use of anticoagulants: Secondary | ICD-10-CM | POA: Diagnosis not present

## 2018-04-13 DIAGNOSIS — Z8249 Family history of ischemic heart disease and other diseases of the circulatory system: Secondary | ICD-10-CM | POA: Insufficient documentation

## 2018-04-13 DIAGNOSIS — R0609 Other forms of dyspnea: Secondary | ICD-10-CM | POA: Diagnosis not present

## 2018-04-13 DIAGNOSIS — N189 Chronic kidney disease, unspecified: Secondary | ICD-10-CM | POA: Diagnosis not present

## 2018-04-13 DIAGNOSIS — Z79899 Other long term (current) drug therapy: Secondary | ICD-10-CM | POA: Insufficient documentation

## 2018-04-13 DIAGNOSIS — Z9889 Other specified postprocedural states: Secondary | ICD-10-CM | POA: Insufficient documentation

## 2018-04-13 DIAGNOSIS — I2510 Atherosclerotic heart disease of native coronary artery without angina pectoris: Secondary | ICD-10-CM | POA: Diagnosis not present

## 2018-04-13 DIAGNOSIS — E785 Hyperlipidemia, unspecified: Secondary | ICD-10-CM | POA: Diagnosis not present

## 2018-04-13 DIAGNOSIS — I129 Hypertensive chronic kidney disease with stage 1 through stage 4 chronic kidney disease, or unspecified chronic kidney disease: Secondary | ICD-10-CM | POA: Insufficient documentation

## 2018-04-13 HISTORY — PX: CORONARY ANGIOGRAPHY: CATH118303

## 2018-04-13 LAB — GLUCOSE, CAPILLARY: Glucose-Capillary: 130 mg/dL — ABNORMAL HIGH (ref 70–99)

## 2018-04-13 SURGERY — CORONARY ANGIOGRAPHY (CATH LAB)
Anesthesia: LOCAL

## 2018-04-13 MED ORDER — LIDOCAINE HCL (PF) 1 % IJ SOLN
INTRAMUSCULAR | Status: AC
  Filled 2018-04-13: qty 30

## 2018-04-13 MED ORDER — METFORMIN HCL ER 500 MG PO TB24: ORAL_TABLET | ORAL | 1 refills | Status: DC

## 2018-04-13 MED ORDER — SODIUM CHLORIDE 0.9 % WEIGHT BASED INFUSION: 1.0000 mL/kg/h | INTRAVENOUS | Status: DC

## 2018-04-13 MED ORDER — FENTANYL CITRATE (PF) 100 MCG/2ML IJ SOLN
INTRAMUSCULAR | Status: DC | PRN
  Administered 2018-04-13 (×2): 25 ug via INTRAVENOUS

## 2018-04-13 MED ORDER — SODIUM CHLORIDE 0.9% FLUSH: 3.0000 mL | Freq: Two times a day (BID) | INTRAVENOUS | Status: DC

## 2018-04-13 MED ORDER — HEPARIN SODIUM (PORCINE) 1000 UNIT/ML IJ SOLN
INTRAMUSCULAR | Status: DC | PRN
  Administered 2018-04-13: 4500 [IU] via INTRAVENOUS

## 2018-04-13 MED ORDER — IOHEXOL 350 MG/ML SOLN
INTRAVENOUS | Status: DC | PRN
  Administered 2018-04-13: 60 mL via INTRAVENOUS

## 2018-04-13 MED ORDER — FENTANYL CITRATE (PF) 100 MCG/2ML IJ SOLN
INTRAMUSCULAR | Status: AC
  Filled 2018-04-13: qty 2

## 2018-04-13 MED ORDER — SODIUM CHLORIDE 0.9% FLUSH: 3.0000 mL | INTRAVENOUS | Status: DC | PRN

## 2018-04-13 MED ORDER — MIDAZOLAM HCL 2 MG/2ML IJ SOLN
INTRAMUSCULAR | Status: DC | PRN
  Administered 2018-04-13: 1 mg via INTRAVENOUS
  Administered 2018-04-13: 2 mg via INTRAVENOUS

## 2018-04-13 MED ORDER — APIXABAN 5 MG PO TABS
5.0000 mg | ORAL_TABLET | Freq: Two times a day (BID) | ORAL | Status: DC
  Filled 2018-04-13: qty 1

## 2018-04-13 MED ORDER — HEPARIN (PORCINE) IN NACL 1000-0.9 UT/500ML-% IV SOLN
INTRAVENOUS | Status: DC | PRN
  Administered 2018-04-13: 500 mL

## 2018-04-13 MED ORDER — SODIUM CHLORIDE 0.9 % IV SOLN: 250.0000 mL | INTRAVENOUS | Status: DC | PRN

## 2018-04-13 MED ORDER — MIDAZOLAM HCL 2 MG/2ML IJ SOLN
INTRAMUSCULAR | Status: AC
  Filled 2018-04-13: qty 2

## 2018-04-13 MED ORDER — VERAPAMIL HCL 2.5 MG/ML IV SOLN
INTRAVENOUS | Status: AC
  Filled 2018-04-13: qty 2

## 2018-04-13 MED ORDER — HEPARIN (PORCINE) IN NACL 1000-0.9 UT/500ML-% IV SOLN
INTRAVENOUS | Status: AC
  Filled 2018-04-13: qty 1000

## 2018-04-13 MED ORDER — ACETAMINOPHEN 325 MG PO TABS: 650.0000 mg | ORAL_TABLET | ORAL | Status: DC | PRN

## 2018-04-13 MED ORDER — ONDANSETRON HCL 4 MG/2ML IJ SOLN: 4.0000 mg | Freq: Four times a day (QID) | INTRAMUSCULAR | Status: DC | PRN

## 2018-04-13 MED ORDER — ASPIRIN 81 MG PO CHEW: 81.0000 mg | CHEWABLE_TABLET | Freq: Once | ORAL | Status: DC

## 2018-04-13 MED ORDER — SODIUM CHLORIDE 0.9 % IV SOLN
INTRAVENOUS | Status: DC
  Administered 2018-04-13: 08:00:00 via INTRAVENOUS

## 2018-04-13 MED ORDER — VERAPAMIL HCL 2.5 MG/ML IV SOLN
INTRAVENOUS | Status: DC | PRN
  Administered 2018-04-13: 10 mL via INTRA_ARTERIAL

## 2018-04-13 MED ORDER — LIDOCAINE HCL (PF) 1 % IJ SOLN
INTRAMUSCULAR | Status: DC | PRN
  Administered 2018-04-13: 2 mL

## 2018-04-13 SURGICAL SUPPLY — 9 items
CATH 5FR JL3.5 JR4 ANG PIG MP (CATHETERS) ×2 IMPLANT
DEVICE RAD COMP TR BAND LRG (VASCULAR PRODUCTS) ×2 IMPLANT
GLIDESHEATH SLEND SS 6F .021 (SHEATH) ×2 IMPLANT
GUIDEWIRE INQWIRE 1.5J.035X260 (WIRE) ×1 IMPLANT
INQWIRE 1.5J .035X260CM (WIRE) ×2
KIT HEART LEFT (KITS) ×2 IMPLANT
PACK CARDIAC CATHETERIZATION (CUSTOM PROCEDURE TRAY) ×2 IMPLANT
TRANSDUCER W/STOPCOCK (MISCELLANEOUS) ×2 IMPLANT
TUBING CIL FLEX 10 FLL-RA (TUBING) ×2 IMPLANT

## 2018-04-13 NOTE — Discharge Instructions (Signed)
Radial Site Care Refer to this sheet in the next few weeks. These instructions provide you with information about caring for yourself after your procedure. Your health care provider may also give you more specific instructions. Your treatment has been planned according to current medical practices, but problems sometimes occur. Call your health care provider if you have any problems or questions after your procedure. What can I expect after the procedure? After your procedure, it is typical to have the following:  Bruising at the radial site that usually fades within 1-2 weeks.  Blood collecting in the tissue (hematoma) that may be painful to the touch. It should usually decrease in size and tenderness within 1-2 weeks.  Follow these instructions at home:  Take medicines only as directed by your health care provider.  You may shower 24-48 hours after the procedure or as directed by your health care provider. Remove the bandage (dressing) and gently wash the site with plain soap and water. Pat the area dry with a clean towel. Do not rub the site, because this may cause bleeding.  Do not take baths, swim, or use a hot tub until your health care provider approves.  Check your insertion site every day for redness, swelling, or drainage.  Do not apply powder or lotion to the site.  Do not flex or bend the affected arm for 24 hours or as directed by your health care provider.  Do not push or pull heavy objects with the affected arm for 24 hours or as directed by your health care provider.  Do not lift over 10 lb (4.5 kg) for 5 days after your procedure or as directed by your health care provider.  Ask your health care provider when it is okay to: ? Return to work or school. ? Resume usual physical activities or sports. ? Resume sexual activity.  Do not drive home if you are discharged the same day as the procedure. Have someone else drive you.  You may drive 24 hours after the procedure  unless otherwise instructed by your health care provider.  Do not operate machinery or power tools for 24 hours after the procedure.  If your procedure was done as an outpatient procedure, which means that you went home the same day as your procedure, a responsible adult should be with you for the first 24 hours after you arrive home.  Keep all follow-up visits as directed by your health care provider. This is important. Contact a health care provider if:  You have a fever.  You have chills.  You have increased bleeding from the radial site. Hold pressure on the site. Get help right away if:  You have unusual pain at the radial site.  You have redness, warmth, or swelling at the radial site.  You have drainage (other than a small amount of blood on the dressing) from the radial site.  The radial site is bleeding, and the bleeding does not stop after 30 minutes of holding steady pressure on the site.  Your arm or hand becomes pale, cool, tingly, or numb. This information is not intended to replace advice given to you by your health care provider. Make sure you discuss any questions you have with your health care provider. Document Released: 07/04/2010 Document Revised: 11/07/2015 Document Reviewed: 12/18/2013 Elsevier Interactive Patient Education  2018 [place] A FULL 48 HOURS AFTER DISCHARGE.

## 2018-04-13 NOTE — Interval H&P Note (Signed)
History and Physical Interval Note:  04/13/2018 8:42 AM  Samuel Willis  has presented today for surgery, with the diagnosis of exertional dypsnea  The various methods of treatment have been discussed with the patient and family. After consideration of risks, benefits and other options for treatment, the patient has consented to  Procedure(s): LEFT HEART CATH AND CORONARY ANGIOGRAPHY (N/A) as a surgical intervention .  The patient's history has been reviewed, patient examined, no change in status, stable for surgery.  I have reviewed the patient's chart and labs.  Questions were answered to the patient's satisfaction.     Norman Bier [employer]

## 2018-04-29 ENCOUNTER — Encounter (HOSPITAL_COMMUNITY): Payer: Medicare Other

## 2018-05-16 ENCOUNTER — Other Ambulatory Visit: Payer: Self-pay

## 2018-05-16 ENCOUNTER — Ambulatory Visit (HOSPITAL_COMMUNITY)
Admission: RE | Admit: 2018-05-16 | Discharge: 2018-05-16 | Disposition: A | Discharge status: Home / Self Care | Payer: Medicare Other | Source: Ambulatory Visit | Attending: Internal Medicine | Admitting: Internal Medicine

## 2018-05-16 ENCOUNTER — Encounter (HOSPITAL_COMMUNITY): Payer: Self-pay

## 2018-05-16 VITALS — BP 128/76 | HR 61 | Wt 207.8 lb

## 2018-05-16 DIAGNOSIS — E1122 Type 2 diabetes mellitus with diabetic chronic kidney disease: Secondary | ICD-10-CM | POA: Insufficient documentation

## 2018-05-16 DIAGNOSIS — I480 Paroxysmal atrial fibrillation: Secondary | ICD-10-CM | POA: Diagnosis not present

## 2018-05-16 DIAGNOSIS — I712 Thoracic aortic aneurysm, without rupture: Secondary | ICD-10-CM | POA: Diagnosis not present

## 2018-05-16 DIAGNOSIS — E785 Hyperlipidemia, unspecified: Secondary | ICD-10-CM | POA: Diagnosis not present

## 2018-05-16 DIAGNOSIS — I350 Nonrheumatic aortic (valve) stenosis: Secondary | ICD-10-CM

## 2018-05-16 DIAGNOSIS — Q231 Congenital insufficiency of aortic valve: Secondary | ICD-10-CM | POA: Insufficient documentation

## 2018-05-16 DIAGNOSIS — Z7901 Long term (current) use of anticoagulants: Secondary | ICD-10-CM | POA: Diagnosis not present

## 2018-05-16 DIAGNOSIS — I129 Hypertensive chronic kidney disease with stage 1 through stage 4 chronic kidney disease, or unspecified chronic kidney disease: Secondary | ICD-10-CM | POA: Diagnosis not present

## 2018-05-16 DIAGNOSIS — I2510 Atherosclerotic heart disease of native coronary artery without angina pectoris: Secondary | ICD-10-CM | POA: Insufficient documentation

## 2018-05-16 DIAGNOSIS — Z79899 Other long term (current) drug therapy: Secondary | ICD-10-CM | POA: Diagnosis not present

## 2018-05-16 DIAGNOSIS — I10 Essential (primary) hypertension: Secondary | ICD-10-CM

## 2018-05-16 DIAGNOSIS — N189 Chronic kidney disease, unspecified: Secondary | ICD-10-CM | POA: Insufficient documentation

## 2018-05-16 DIAGNOSIS — Z7984 Long term (current) use of oral hypoglycemic drugs: Secondary | ICD-10-CM | POA: Diagnosis not present

## 2018-05-16 NOTE — Progress Notes (Signed)
Patient ID: Samuel Willis, male   DOB: 1946-02-13, 72 y.o.   MRN: [id number] PCP: Dr. Anitra Lauth Cardiology: Dr. [name]  72 y.o. with history of bicuspid aortic valve and mild-moderate AS/mild AI on last echo in 6/18 as well as paroxysmal atrial fibrillation presents for followup. He had a Watchman device placed in 5/17. Cardiolite in 8/18 showed no evidence for ischemia/infarction.   He had recurrent atrial fibrillation in 9/19, went back into NSR spontaneously after 4-5 days.  He started apixaban in case he needed DCCV and has stayed on it (had not been willing to take in the past). Weight is down 9 lbs since last appointment.   Echo (10/19) showed EF 60-65%, mild AS, mild AI with bicuspid aortic valve.   Today he returns for HF follow up. Had [place] in October. Overall feeling fine. Does have some SOB with exertion. Denies PND/Orthopnea. No chest pain. No bleeding problems.  Appetite ok. No fever or chills. Taking all medications. He has several businesses and continues to work.   ECG (9/19, personally reviewed): NSR, normal.  Labs (12/10): creatinine 1.4  Labs (6/12): K 4.5, creatinine 1.2, LDL 66, HDL 48 Labs (7/14): K 4.5, creatinine 1.3, LDL 71, HDL 49 Labs (1/15): K 4.8, creatinine 1.5, LDL 58, HDL 36 Labs (2/16): K 4.5, creatinine 1.17, LDL 83, HDL 42, TSH normal Labs (3/17): LDL 72, HDL 44 Labs (5/17): K 4.1, creatinine 1.2 Labs (4/18): LDL 92, HDL 37 Labs (8/18): K 5.1, creatinine 1.27, LDL 55, HDL 42 Labs (8/19): LDL 69, HDL 41 Labs (9/19): TSH normal, K 4.7, creatinine 1.22  Allergies (verified):  No Known Drug Allergies   Past Medical History:  1.  Atrial fibrillation: Paroxysmal. Patient has a brief episode of irregular heart beating every 1-2 years. One was apparently documented as atrial fibrillation years ago in [employer]. His CHADSVASC score is 3 now that CAD has been found. LINQ monitor was placed and has documented paroxysmal atrial fibrillation. - S/p Watchman  placement in 5/17.  2. Hypertension.  3. Hyperlipidemia.  4. CAD: Exercise treadmill Myoview done in January 2010. The patient exercised for 10 minutes and 46 seconds. He stopped due to fatigue. EF was 56%. There was normal perfusion with no evidence for ischemia or infarction. LHC (1/12): 60-70% mLAD stenosis.  ETT-myoview was then done to assess for ischemia in the LAD territory. Patient exercised 10', EF was 69% with no evidence for ischemia or infarction.   - ETT-Cardiolite (8/18): EF 56%, 10 minutes exercise, no evidence for ischemia/infarction (normal).  5. Aortic stenosis and aortic regurgitation (bicuspid aortic valve). The patient did have an echocardiogram done on June 26, 2008. EF was 65%. There were no regional wall motion abnormalities. There was moderate aortic regurgitation. There was mild aortic stenosis by mean gradient which was 12 mmHg. There was mild ascending aorta and aortic root dilation. There was mild mitral regurgitation, mild left atrial enlargement, and right atrial enlargement. TEE was done, confirming a functionally bicuspid aortic valve with fusion of the right and noncoronary cusps. There was mild aortic stenosis and moderate aortic insufficiency. The ascending aorta was mildly dilated at 3.9 cm. TTE (1/10) showed EF 60% with mild LVH, mild AS (mean gradient 12), mild to moderate AR, normal LV size. TTE (1/12) with EF 60%, mild LVH, moderate diastolic dysfunction, mild AS (mean gradient 11 mmHg), mild AI, normal RV. MRA chest (1/12) with 3.7 cm aortic root and ascending aorta. TTE (2/13): EF 60-65%, mild LVH, mild AS, mild  AI, mild MR.  Echo (2/15) with EF 60-65%, bicuspid aortic valve with mild aortic stenosis and mild to moderate AI, mild MR.  MRA chest (2/15) with 4.1 cm aortic root and 4.1 cm ascending aorta.  Echo (4/16) with EF 60-65%, mild AS mean gradient 16 mmHg.   - TEE (7/17) with EF 60-65%, bicuspid aortic valve with mild to moderate AS, moderate AI.  - Echo  (6/18): EF 60-65%, moderate LVH, bicuspid aortic valve with mild to moderate AS (mean gradient 12, AVA 1.75 cm^2), mild AI, ascending aorta 4.1 cm.  - MRA chest (6/18): Ascending aorta 3.8 cm - Echo (10/19): EF 60-65%, mild LVH, mild AS, mild AI, bicuspid aortic valve.  6. Biceps tendon rupture.  7. CKD 8. Ascending aortic aneurysm: TEE (2/17) with 4.0 cm ascending aorta.  9. Type II diabetes  Family History:  The patient's mother has a history of stroke, also has questionable history of atrial fibrillation. The patient's father had an MI in his 80s.   Social History:  The patient is married, lives in [place], is a nonsmoker. He owns several businesses. He is not drinking alcohol. He works out with a Clinical research associate a couple of days a week and gets a significant amount of cardiovascular exercise.   Review of Systems  All systems reviewed and negative except as per HPI.   Current Outpatient Medications  Medication Sig Dispense Refill  . amLODipine (NORVASC) 10 MG tablet Take 10 mg by mouth daily.    Marland Kitchen apixaban (ELIQUIS) 5 MG TABS tablet Take 1 tablet (5 mg total) by mouth 2 (two) times daily. 60 tablet 2  . atorvastatin (LIPITOR) 40 MG tablet Take 1 tablet (40 mg total) by mouth daily. 90 tablet 3  . benazepril (LOTENSIN) 20 MG tablet Take 20 mg by mouth daily.     . metoprolol succinate (TOPROL-XL) 25 MG 24 hr tablet Take 0.5 tablets (12.5 mg total) by mouth daily. 15 tablet 2  . metFORMIN (GLUCOPHAGE-XR) 500 MG 24 hr tablet TAKE 2 TABLETS BY MOUTH ONCE DAILY WITH BREAKFAST  Start back on metformin on Friday. 180 tablet 1  . pantoprazole (PROTONIX) 40 MG tablet Take 40 mg by mouth 2 (two) times daily.     No current facility-administered medications for this encounter.     BP 128/76   Pulse 61   Wt 94.3 kg (207 lb 12.8 oz)   SpO2 95%   BMI 28.98 kg/m   Wt Readings from Last 3 Encounters:  05/16/18 94.3 kg (207 lb 12.8 oz)  04/13/18 88.5 kg (195 lb)  04/08/18 91.4 kg (201 lb 6.4  oz)   General:  Well appearing. No resp difficulty HEENT: normal Neck: supple. no JVD. Carotids 2+ bilat; no bruits. No lymphadenopathy or thryomegaly appreciated. Cor: PMI nondisplaced. Regular rate & rhythm. No rubs, gallops. RUSB 2/6 murmurs. Lungs: clear Abdomen: soft, nontender, nondistended. No hepatosplenomegaly. No bruits or masses. Good bowel sounds. Extremities: no cyanosis, clubbing, rash, edema Neuro: alert & orientedx3, cranial nerves grossly intact. moves all 4 extremities w/o difficulty. Affect pleasant    Assessment/Plan: 1. Bicuspid aortic valve disorder: Mild AS with mild AI on 10/19 echo. Minimal dilation of ascending aorta on 6/18 MRA (3.8 cm).   - Repeat echo in 2 years-->2021 2. CAD: Moderate nonobstructive disease on 2012 cath.  Normal Cardiolite in 8/18.   -He had LHC in October of this year and this showed no significant coronary disease.  - No s/s ischemia  - Continue statin.  -  He is not on ASA as he is taking apixaban.   3. Hyperlipidemia: Good lipids in 8/19.  Continue statin.   4. HTN: Stable. .   - Continue amlodipine and benazepril.      5. Atrial fibrillation: Paroxysmal atrial fibrillation seen by [facility] monitor.  LINQ battery end of life 08/2017.  - He has a Watchman device.  - Continue apixaban.   Follow up in 6 months with Dr [name].    Amy Clegg 05/16/2018

## 2018-05-16 NOTE — Patient Instructions (Signed)
Follow up with Dr.McLean in 6 months 

## 2018-05-18 DIAGNOSIS — Z9622 Myringotomy tube(s) status: Secondary | ICD-10-CM | POA: Diagnosis not present

## 2018-05-18 DIAGNOSIS — H6983 Other specified disorders of Eustachian tube, bilateral: Secondary | ICD-10-CM | POA: Diagnosis not present

## 2018-05-23 DIAGNOSIS — H2513 Age-related nuclear cataract, bilateral: Secondary | ICD-10-CM | POA: Diagnosis not present

## 2018-05-27 ENCOUNTER — Other Ambulatory Visit (HOSPITAL_COMMUNITY): Payer: Self-pay | Admitting: Nurse Practitioner

## 2018-06-03 ENCOUNTER — Other Ambulatory Visit: Payer: Self-pay | Admitting: Family Medicine

## 2018-07-01 ENCOUNTER — Other Ambulatory Visit: Payer: Self-pay | Admitting: Family Medicine

## 2018-07-05 ENCOUNTER — Other Ambulatory Visit: Payer: Self-pay

## 2018-07-05 ENCOUNTER — Telehealth: Payer: Self-pay | Admitting: Family Medicine

## 2018-07-05 MED ORDER — METFORMIN HCL ER 500 MG PO TB24: ORAL_TABLET | ORAL | 0 refills | Status: DC

## 2018-07-05 NOTE — Telephone Encounter (Signed)
Left message Rx was sent to pharmacy as requested.

## 2018-07-05 NOTE — Addendum Note (Signed)
Addended by: Marian Sorrow on: 07/05/2018 04:15 PM   Modules accepted: Orders

## 2018-07-05 NOTE — Telephone Encounter (Signed)
Pt stated his Metformin is due for refill. Pt stated he will be out 07/07/2018. Please advise.

## 2018-08-05 ENCOUNTER — Ambulatory Visit: Payer: Medicare Other | Admitting: Family Medicine

## 2018-08-15 DIAGNOSIS — L570 Actinic keratosis: Secondary | ICD-10-CM | POA: Diagnosis not present

## 2018-08-15 DIAGNOSIS — D225 Melanocytic nevi of trunk: Secondary | ICD-10-CM | POA: Diagnosis not present

## 2018-08-15 DIAGNOSIS — Z85828 Personal history of other malignant neoplasm of skin: Secondary | ICD-10-CM | POA: Diagnosis not present

## 2018-08-15 DIAGNOSIS — D224 Melanocytic nevi of scalp and neck: Secondary | ICD-10-CM | POA: Diagnosis not present

## 2018-08-15 DIAGNOSIS — D1801 Hemangioma of skin and subcutaneous tissue: Secondary | ICD-10-CM | POA: Diagnosis not present

## 2018-08-15 DIAGNOSIS — D2262 Melanocytic nevi of left upper limb, including shoulder: Secondary | ICD-10-CM | POA: Diagnosis not present

## 2018-08-15 DIAGNOSIS — L821 Other seborrheic keratosis: Secondary | ICD-10-CM | POA: Diagnosis not present

## 2018-08-24 ENCOUNTER — Other Ambulatory Visit: Payer: Self-pay

## 2018-08-24 ENCOUNTER — Encounter: Payer: Self-pay | Admitting: Family Medicine

## 2018-08-24 ENCOUNTER — Ambulatory Visit (INDEPENDENT_AMBULATORY_CARE_PROVIDER_SITE_OTHER): Payer: Medicare Other | Admitting: Family Medicine

## 2018-08-24 VITALS — BP 120/80 | HR 66 | Ht 71.0 in | Wt 196.2 lb

## 2018-08-24 DIAGNOSIS — E1159 Type 2 diabetes mellitus with other circulatory complications: Secondary | ICD-10-CM

## 2018-08-24 DIAGNOSIS — I480 Paroxysmal atrial fibrillation: Secondary | ICD-10-CM | POA: Diagnosis not present

## 2018-08-24 DIAGNOSIS — E1169 Type 2 diabetes mellitus with other specified complication: Secondary | ICD-10-CM | POA: Diagnosis not present

## 2018-08-24 DIAGNOSIS — E785 Hyperlipidemia, unspecified: Secondary | ICD-10-CM | POA: Diagnosis not present

## 2018-08-24 DIAGNOSIS — I10 Essential (primary) hypertension: Secondary | ICD-10-CM

## 2018-08-24 DIAGNOSIS — I152 Hypertension secondary to endocrine disorders: Secondary | ICD-10-CM

## 2018-08-24 LAB — CBC WITH DIFFERENTIAL/PLATELET
Basophils Absolute: 0 10*3/uL (ref 0.0–0.1)
Basophils Relative: 0.6 % (ref 0.0–3.0)
Eosinophils Absolute: 0.1 10*3/uL (ref 0.0–0.7)
Eosinophils Relative: 2.3 % (ref 0.0–5.0)
HCT: 43.2 % (ref 39.0–52.0)
Hemoglobin: 15.1 g/dL (ref 13.0–17.0)
Lymphocytes Relative: 25.4 % (ref 12.0–46.0)
Lymphs Abs: 1 10*3/uL (ref 0.7–4.0)
MCHC: 34.9 g/dL (ref 30.0–36.0)
MCV: 93.4 fl (ref 78.0–100.0)
Monocytes Absolute: 0.3 10*3/uL (ref 0.1–1.0)
Monocytes Relative: 8.1 % (ref 3.0–12.0)
Neutro Abs: 2.4 10*3/uL (ref 1.4–7.7)
Neutrophils Relative %: 63.6 % (ref 43.0–77.0)
Platelets: 190 10*3/uL (ref 150.0–400.0)
RBC: 4.63 Mil/uL (ref 4.22–5.81)
RDW: 12.6 % (ref 11.5–15.5)
WBC: 3.8 10*3/uL — ABNORMAL LOW (ref 4.0–10.5)

## 2018-08-24 LAB — COMPREHENSIVE METABOLIC PANEL
ALT: 21 U/L (ref 0–53)
AST: 21 U/L (ref 0–37)
Albumin: 4.7 g/dL (ref 3.5–5.2)
Alkaline Phosphatase: 61 U/L (ref 39–117)
BUN: 23 mg/dL (ref 6–23)
CO2: 25 mEq/L (ref 19–32)
Calcium: 9.6 mg/dL (ref 8.4–10.5)
Chloride: 105 mEq/L (ref 96–112)
Creatinine, Ser: 1.23 mg/dL (ref 0.40–1.50)
GFR: 57.84 mL/min — ABNORMAL LOW (ref >60.00)
Glucose, Bld: 113 mg/dL — ABNORMAL HIGH (ref 70–99)
Potassium: 4.8 mEq/L (ref 3.5–5.1)
Sodium: 138 mEq/L (ref 135–145)
Total Bilirubin: 0.7 mg/dL (ref 0.2–1.2)
Total Protein: 6.7 g/dL (ref 6.0–8.3)

## 2018-08-24 LAB — LIPID PANEL
Cholesterol: 143 mg/dL (ref 0–200)
HDL: 49.4 mg/dL (ref >39.00)
LDL Cholesterol: 79 mg/dL (ref 0–99)
NonHDL: 93.55
Total CHOL/HDL Ratio: 3
Triglycerides: 74 mg/dL (ref 0.0–149.0)
VLDL: 14.8 mg/dL (ref 0.0–40.0)

## 2018-08-24 LAB — HEMOGLOBIN A1C: Hgb A1c MFr Bld: 6.2 % (ref 4.6–6.5)

## 2018-08-24 NOTE — Progress Notes (Signed)
Samuel Willis is a 73 y.o. male is here for follow up.  Assessment and Plan:   Type 2 diabetes mellitus with other specified complication ([place]), controlled with Metformin Lab Results  Component Value Date   HGBA1C 6.2 08/24/2018   Well controlled.  No signs of complications, medication side effects, or red flags.  Continue current regimen.    Hyperlipidemia, controlled with Lipitor, LDL goal < 70 Lab Results  Component Value Date   CHOL 143 08/24/2018   HDL 49.40 08/24/2018   LDLCALC 79 08/24/2018   TRIG 74.0 08/24/2018   CHOLHDL 3 08/24/2018   Lab Results  Component Value Date   ALT 21 08/24/2018   AST 21 08/24/2018   ALKPHOS 61 08/24/2018   BILITOT 0.7 08/24/2018   Well controlled.  No signs of complications, medication side effects, or red flags.  Continue current regimen.    Hypertension associated with diabetes ([place]), controlled with Amlodipine, Benazepril, Metoprolol BP Readings from Last 3 Encounters:  08/24/18 120/80  05/16/18 128/76  04/13/18 124/76   Lab Results  Component Value Date   CREATININE 1.23 08/24/2018   Well controlled.  No signs of complications, medication side effects, or red flags.  Continue current regimen.    Orders Placed This Encounter  Procedures  . CBC with Differential/Platelet  . Comprehensive metabolic panel  . Lipid panel  . Hemoglobin A1c   Subjective:   HPI: See Assessment and Plan section for Problem Based Charting of issues discussed today.   Health Maintenance:   Health Maintenance Due  Topic Date Due  . OPHTHALMOLOGY EXAM  08/07/1955  . PNA vac Low Risk Adult (2 of 2 - PPSV23) 11/24/2015  . FOOT EXAM  08/05/2018   Depression screen [facility] 2/9 02/02/2018 09/17/2016 08/26/2015  Decreased Interest 0 0 0  Down, Depressed, Hopeless 0 0 0  PHQ - 2 Score 0 0 0   PMHx, SurgHx, SocialHx, FamHx, Medications, and Allergies were reviewed in the Visit Navigator and updated as appropriate.   Patient Active Problem List   Diagnosis Date Noted  . Sensorineural hearing loss (SNHL), bilateral 04/06/2018  . Eustachian tube dysfunction, bilateral 04/05/2018  . MCI (mild cognitive impairment) 01/09/2016  . Bicuspid aortic valve   . Aortic insufficiency   . Aortic stenosis   . Ascending aortic aneurysm ([place])   . Paroxysmal atrial fibrillation ([place]) 10/31/2015  . Type 2 diabetes mellitus with other specified complication ([place]), controlled with Metformin 06/26/2013  . Asbestos exposure 06/26/2013  . Contusion of right knee 04/26/2012  . CAD (coronary artery disease) 07/26/2011  . Barrett's esophagus 02/23/2011  . GERD (gastroesophageal reflux disease) 01/06/2011  . Esophageal dysphagia 11/30/2010  . Aneurysm of thoracic aorta ([place]) 03/20/2010  . Hyperlipidemia, controlled with Lipitor, LDL goal < 70 07/16/2008  . Hypertension associated with diabetes ([place]), controlled with Amlodipine, Benazepril, Metoprolol 07/16/2008   Social History   Tobacco Use  . Smoking status: Former Smoker    Packs/day: 0.50    Years: 10.00    Pack years: 5.00    Types: Cigarettes    Last attempt to quit: 11/03/1993    Years since quitting: 24.8  . Smokeless tobacco: Never Used  Substance Use Topics  . Alcohol use: No  . Drug use: No   Current Medications and Allergies:   Current Outpatient Medications:  .  amLODipine (NORVASC) 10 MG tablet, TAKE 1 TABLET BY MOUTH ONCE DAILY, Disp: 90 tablet, Rfl: 0 .  atorvastatin (LIPITOR) 40 MG tablet, Take 1  tablet (40 mg total) by mouth daily., Disp: 90 tablet, Rfl: 3 .  benazepril (LOTENSIN) 20 MG tablet, Take 20 mg by mouth daily. , Disp: , Rfl:  .  ELIQUIS 5 MG TABS tablet, TAKE 1 TABLET BY MOUTH TWICE DAILY, Disp: 60 tablet, Rfl: 3 .  metFORMIN (GLUCOPHAGE-XR) 500 MG 24 hr tablet, TAKE 2 TABLETS BY MOUTH ONCE DAILY WITH BREAKFAST, Disp: 180 tablet, Rfl: 0 .  metoprolol succinate (TOPROL-XL) 25 MG 24 hr tablet, Take 0.5 tablets (12.5 mg total) by mouth daily., Disp: 15 tablet, Rfl:  2 .  pantoprazole (PROTONIX) 40 MG tablet, Take 40 mg by mouth 2 (two) times daily., Disp: , Rfl:   No Known Allergies   Review of Systems   Pertinent items are noted in the HPI. Otherwise, ROS is negative.  Vitals:   Vitals:   08/24/18 1106  BP: 120/80  Pulse: 66  SpO2: 97%  Weight: 196 lb 3.2 oz (89 kg)  Height: 5\' 11"  (1.803 m)     Body mass index is 27.36 kg/m. Physical Exam:   General: Cooperative, alert and oriented, well developed, well nourished, in no acute distress. HEENT: Pupils equal round reactive light and extraocular movements intact. Conjunctivae and lids unremarkable. No pallor or cyanosis, dentition good. Neck: No thyromegaly.  Cardiovascular: Regular rhythm. No murmurs appreciated.  Lungs: Normal work of breathing. Clear bilaterally without rales, rhonchi, or wheezing.  Abdomen: Soft, nontender, no masses. Normal bowel sounds. Extremities: No clubbing, cyanosis, erythema. No edema.  Skin: Warm and dry. Neurologic: No focal deficits.  Psychiatric: Normal affect and thought content.   . Reviewed expectations re: course of current medical issues. . Discussed self-management of symptoms. . Outlined signs and symptoms indicating need for more acute intervention. . Patient verbalized understanding and all questions were answered. Marland Kitchen Health Maintenance issues including appropriate healthy diet, exercise, and smoking avoidance were discussed with patient. . See orders for this visit as documented in the electronic medical record. . Patient received an After Visit Summary.  Briscoe Deutscher, DO [place], [facility] 08/27/2018

## 2018-08-26 ENCOUNTER — Other Ambulatory Visit: Payer: Self-pay | Admitting: Family Medicine

## 2018-08-27 ENCOUNTER — Encounter: Payer: Self-pay | Admitting: Family Medicine

## 2018-08-27 NOTE — Assessment & Plan Note (Signed)
BP Readings from Last 3 Encounters:  08/24/18 120/80  05/16/18 128/76  04/13/18 124/76   Lab Results  Component Value Date   CREATININE 1.23 08/24/2018   Well controlled.  No signs of complications, medication side effects, or red flags.  Continue current regimen.

## 2018-08-27 NOTE — Assessment & Plan Note (Signed)
Lab Results  Component Value Date   CHOL 143 08/24/2018   HDL 49.40 08/24/2018   LDLCALC 79 08/24/2018   TRIG 74.0 08/24/2018   CHOLHDL 3 08/24/2018   Lab Results  Component Value Date   ALT 21 08/24/2018   AST 21 08/24/2018   ALKPHOS 61 08/24/2018   BILITOT 0.7 08/24/2018   Well controlled.  No signs of complications, medication side effects, or red flags.  Continue current regimen.

## 2018-08-27 NOTE — Assessment & Plan Note (Addendum)
Lab Results  Component Value Date   HGBA1C 6.2 08/24/2018   Well controlled.  No signs of complications, medication side effects, or red flags.  Continue current regimen.

## 2018-09-27 ENCOUNTER — Other Ambulatory Visit (HOSPITAL_COMMUNITY): Payer: Self-pay | Admitting: Cardiology

## 2018-09-27 ENCOUNTER — Other Ambulatory Visit: Payer: Self-pay | Admitting: Family Medicine

## 2018-09-27 DIAGNOSIS — I480 Paroxysmal atrial fibrillation: Secondary | ICD-10-CM

## 2018-09-27 DIAGNOSIS — E785 Hyperlipidemia, unspecified: Secondary | ICD-10-CM

## 2018-09-27 NOTE — Telephone Encounter (Signed)
Last OV 08/24/2018 Last refill Amlodipine 10 mg 06/03/2018 #90/0                 Atorvastatin 40 mg 08/05/2017 #90/3 Next OV 02/27/2019

## 2018-10-21 DIAGNOSIS — H66001 Acute suppurative otitis media without spontaneous rupture of ear drum, right ear: Secondary | ICD-10-CM | POA: Diagnosis not present

## 2018-10-21 DIAGNOSIS — H6983 Other specified disorders of Eustachian tube, bilateral: Secondary | ICD-10-CM | POA: Diagnosis not present

## 2018-10-21 DIAGNOSIS — Z9622 Myringotomy tube(s) status: Secondary | ICD-10-CM | POA: Diagnosis not present

## 2018-10-31 ENCOUNTER — Telehealth (HOSPITAL_COMMUNITY): Payer: Self-pay | Admitting: *Deleted

## 2018-10-31 NOTE — Telephone Encounter (Signed)
Patient called in stating he converted into afib at 8am this morning - wondering what he should do - pt unsure of heart rate/blood pressure - he will call when he gets back home and update me with this information. He did take a full tablet of metoprolol this morning. He stated he only takes his eliquis once a day - instructed pt he should be taking eliquis twice a day to protect from stroke and this will put his count to today - he will have 48 hours for dccv otherwise will have to wait either 5 doses with TEE or 3 weeks - pt stated he usually converts on his own and would prefer to wait and see if this happens again. He will call with update on HRs this afternoon to determine if further titration of medications are warranted.

## 2018-10-31 NOTE — Telephone Encounter (Signed)
Patient called with update that after taking extra 1/2 of metoprolol he has gone back into rhythm. Hr is back to normal. He had questions on his BP - has lost about 30lbs and having some lower blood pressure which make him symptomatic. He has been holding his amlodpine for the last week - instructed pt to follow up with his PCP for bp medication adjustments. Pt verbalized understanding.

## 2018-11-02 ENCOUNTER — Ambulatory Visit: Payer: Self-pay | Admitting: *Deleted

## 2018-11-02 DIAGNOSIS — I10 Essential (primary) hypertension: Secondary | ICD-10-CM | POA: Diagnosis not present

## 2018-11-02 DIAGNOSIS — I482 Chronic atrial fibrillation, unspecified: Secondary | ICD-10-CM | POA: Diagnosis not present

## 2018-11-02 NOTE — Telephone Encounter (Signed)
Pt reports H/O a-fib. States "Went in to A-Fib Monday but it self corrected." States "Just hasn't  felt right since."  States "I guess I'm back in A-fib."  Reports BP 154/85, HR 51, "Goes higher then." Reports mild chest tightness. States he called A-Fib Clinic Monday but "They really didn't tell me anything." Pt requesting to come in for EKG. After hours call.  Pt directed to ED. States he will go to [facility] for an EKG. Pt made aware he may be sent to ED, verbalizes understanding. Care advise given, states family will drive.  Reason for Disposition . Chest pain lasts > 5 minutes (Exceptions: chest pain occurring > 3 days ago and now asymptomatic; same as previously diagnosed heartburn and has accompanying sour taste in mouth)  Answer Assessment - Initial Assessment Questions 1. LOCATION: "Where does it hurt?"       tightness 2. RADIATION: "Does the pain go anywhere else?" (e.g., into neck, jaw, arms, back)      3. ONSET: "When did the chest pain begin?" (Minutes, hours or days)     Monday 4. PATTERN "Does the pain come and go, or has it been constant since it started?"  "Does it get worse with exertion?"      *No Answer* 5. DURATION: "How long does it last" (e.g., seconds, minutes, hours)     *No Answer* 6. SEVERITY: "How bad is the pain?"  (e.g., Scale 1-10; mild, moderate, or severe)    - MILD (1-3): doesn't interfere with normal activities     - MODERATE (4-7): interferes with normal activities or awakens from sleep    - SEVERE (8-10): excruciating pain, unable to do any normal activities       mild 7. CARDIAC RISK FACTORS: "Do you have any history of heart problems or risk factors for heart disease?" (e.g., prior heart attack, angina; high blood pressure, diabetes, being overweight, high cholesterol, smoking, or strong family history of heart disease)     A-Fib 8. PULMONARY RISK FACTORS: "Do you have any history of lung disease?"  (e.g., blood clots in lung, asthma, emphysema, birth control  pills)      9. CAUSE: "What do you think is causing the chest pain?"    A-Fib 10. OTHER SYMPTOMS: "Do you have any other symptoms?" (e.g., dizziness, nausea, vomiting, sweating, fever, difficulty breathing, cough)       "Just don't feel right."  Protocols used: CHEST PAIN-A-AH

## 2018-11-03 NOTE — Telephone Encounter (Signed)
See note

## 2018-11-03 NOTE — Telephone Encounter (Signed)
Called patient was seen at [facility] and had EKG and it was normal. He is feeling better will call and let us know if needs app or any changes in symptoms.

## 2018-12-05 ENCOUNTER — Other Ambulatory Visit: Payer: Self-pay | Admitting: Family Medicine

## 2018-12-05 ENCOUNTER — Other Ambulatory Visit: Payer: Self-pay

## 2018-12-05 NOTE — Telephone Encounter (Signed)
See note

## 2018-12-05 NOTE — Telephone Encounter (Signed)
Pt called and stated that he is going out town tomorrow and would like this medication sent in ASAP. Please advise

## 2018-12-21 DIAGNOSIS — H6123 Impacted cerumen, bilateral: Secondary | ICD-10-CM | POA: Insufficient documentation

## 2018-12-30 ENCOUNTER — Other Ambulatory Visit (HOSPITAL_COMMUNITY): Payer: Self-pay | Admitting: Cardiology

## 2018-12-30 ENCOUNTER — Other Ambulatory Visit (HOSPITAL_COMMUNITY): Payer: Self-pay | Admitting: Nurse Practitioner

## 2018-12-30 ENCOUNTER — Other Ambulatory Visit: Payer: Self-pay | Admitting: Family Medicine

## 2018-12-30 DIAGNOSIS — I480 Paroxysmal atrial fibrillation: Secondary | ICD-10-CM

## 2019-01-06 ENCOUNTER — Other Ambulatory Visit (HOSPITAL_COMMUNITY): Payer: Self-pay | Admitting: *Deleted

## 2019-01-06 MED ORDER — APIXABAN 5 MG PO TABS: 5.0000 mg | ORAL_TABLET | Freq: Two times a day (BID) | ORAL | 6 refills | Status: DC

## 2019-01-12 ENCOUNTER — Telehealth (HOSPITAL_COMMUNITY): Payer: Self-pay | Admitting: Cardiology

## 2019-01-12 NOTE — Telephone Encounter (Signed)
Patient called to report he is back in A-fib  Reports he felt himself 'flip back in" at 1500 01/11/19 Since then he has increased his half dose of metoprolol to a whole tab (25mg ) twice daily, he has held eliquis in the event a DCCV is needed.  Reports his b/p is stable and HR is irregular in the 100-110's   Advised will forward to afib clinic for further management

## 2019-01-12 NOTE — Telephone Encounter (Signed)
Discussed with patient - instructed pt to resume eliquis he should not miss any doses of eliquis (he missed his dose last night and this PM). BP 106/80 HR 70-113. Feels overall ok just tired. Instructed pt to continue metoprolol at 25mg  twice a day while in AF and he can hold amlodipine/lisinopril if he should have hypotension while on increased metoprolol. He would like to wait until Monday to be seen incase he should convert on his own. Appt made for Monday he will call and cancel should he convert on his own. Pt verbalized understanding to restart his eliquis without missed doses.

## 2019-01-12 NOTE — Telephone Encounter (Signed)
Left message for the patient to return my call.

## 2019-01-16 ENCOUNTER — Other Ambulatory Visit: Payer: Self-pay

## 2019-01-16 ENCOUNTER — Ambulatory Visit (HOSPITAL_COMMUNITY)
Admission: RE | Admit: 2019-01-16 | Discharge: 2019-01-16 | Disposition: A | Discharge status: Home / Self Care | Payer: Medicare Other | Source: Ambulatory Visit | Attending: Physician Assistant | Admitting: Physician Assistant

## 2019-01-16 ENCOUNTER — Encounter (HOSPITAL_COMMUNITY): Payer: Self-pay | Admitting: Physician Assistant

## 2019-01-16 DIAGNOSIS — I719 Aortic aneurysm of unspecified site, without rupture: Secondary | ICD-10-CM | POA: Insufficient documentation

## 2019-01-16 DIAGNOSIS — Z833 Family history of diabetes mellitus: Secondary | ICD-10-CM | POA: Insufficient documentation

## 2019-01-16 DIAGNOSIS — I129 Hypertensive chronic kidney disease with stage 1 through stage 4 chronic kidney disease, or unspecified chronic kidney disease: Secondary | ICD-10-CM | POA: Diagnosis not present

## 2019-01-16 DIAGNOSIS — R9431 Abnormal electrocardiogram [ECG] [EKG]: Secondary | ICD-10-CM | POA: Diagnosis not present

## 2019-01-16 DIAGNOSIS — K219 Gastro-esophageal reflux disease without esophagitis: Secondary | ICD-10-CM | POA: Insufficient documentation

## 2019-01-16 DIAGNOSIS — Z8249 Family history of ischemic heart disease and other diseases of the circulatory system: Secondary | ICD-10-CM | POA: Diagnosis not present

## 2019-01-16 DIAGNOSIS — N183 Chronic kidney disease, stage 3 (moderate): Secondary | ICD-10-CM | POA: Insufficient documentation

## 2019-01-16 DIAGNOSIS — I480 Paroxysmal atrial fibrillation: Secondary | ICD-10-CM | POA: Diagnosis not present

## 2019-01-16 DIAGNOSIS — Z79899 Other long term (current) drug therapy: Secondary | ICD-10-CM | POA: Insufficient documentation

## 2019-01-16 DIAGNOSIS — Z7984 Long term (current) use of oral hypoglycemic drugs: Secondary | ICD-10-CM | POA: Diagnosis not present

## 2019-01-16 DIAGNOSIS — I2510 Atherosclerotic heart disease of native coronary artery without angina pectoris: Secondary | ICD-10-CM | POA: Insufficient documentation

## 2019-01-16 DIAGNOSIS — Q231 Congenital insufficiency of aortic valve: Secondary | ICD-10-CM | POA: Insufficient documentation

## 2019-01-16 DIAGNOSIS — R7303 Prediabetes: Secondary | ICD-10-CM | POA: Insufficient documentation

## 2019-01-16 DIAGNOSIS — E785 Hyperlipidemia, unspecified: Secondary | ICD-10-CM | POA: Insufficient documentation

## 2019-01-16 DIAGNOSIS — Z87891 Personal history of nicotine dependence: Secondary | ICD-10-CM | POA: Insufficient documentation

## 2019-01-16 DIAGNOSIS — Z7901 Long term (current) use of anticoagulants: Secondary | ICD-10-CM | POA: Diagnosis not present

## 2019-01-16 MED ORDER — METOPROLOL SUCCINATE ER 25 MG PO TB24: ORAL_TABLET | ORAL | 0 refills | Status: DC

## 2019-01-16 NOTE — Patient Instructions (Addendum)
Increase metoprolol to 25mg  in the AM and 37.5mg  in the PM  Cardioversion scheduled for Tuesday, August 25th  - Come to the office at 11:30am for lab work  - Arrive at the [facility] and go to admitting at 12PM  -Do not eat or drink anything after midnight the night prior to your procedure.  - Take all your medication with a sip of water prior to arrival.  - You will not be able to drive home after your procedure.

## 2019-01-16 NOTE — Progress Notes (Signed)
Primary Care Physician: Briscoe Deutscher, DO Referring Physician: Dr. [name] Primary Cardiologist: Dr Riley Churches Samuel Willis is a 73 y.o. male with a h/o CAD, DM, bicuspid aortic valve, paroxysmal  afib, s/p Watchman in 2017 in the afib clinic for follow up. Patient reports that on 01/11/19, he woke up from a nap and felt that he was in afib with symptoms of palpitations and fatigue. He checked his heart rate with his BP machine and it has shown heart rates ranging from 80s-150. He has increased his BB which has helped with rate control. He did miss his Eliquis on 01/11/19. He denies any specific triggers that he can identify. He had a similar episode in 10/2018 but spontaneously converted.   Today, he denies symptoms of chest pain, shortness of breath, orthopnea, PND, lower extremity edema, dizziness, presyncope, syncope, or neurologic sequela.+ for palpitations and fatigue. The patient is tolerating medications without difficulties and is otherwise without complaint today.   Past Medical History:  Diagnosis Date  . Aortic aneurysm, thoracic (HCC)    bicuspid aortic valve. aortic stenosis, aortic regurg  . Asbestos exposure CT 06/2013   Noncalcified pleural plaques bilat; no signs of malignancy.  . Bicuspid aortic valve    aortic stenosis, aortic regurgitation; Dr. [name] plans to get an echo and MRA chest 12/2016  . CAD (coronary artery disease)    LAD dz but no corresponding ischemia on myoview  . Chronic renal insufficiency, stage III (moderate) (HCC) 06/2013   CrCl about 50 ml/min  . Confusion summer 2017   Eval by Dr. Erlinda Hong, neurology---suspected dx is impaired concentration/focus, but EEG being done to further r/o seizures.  . Diabetes mellitus without complication ([place])   . Fatty liver 04/2012; 06/2013   Noted on noncontrast abd CT done during trauma w/u when tractor rolled onto his knee.  Also noted on noncontrast chest CT done to screen for asbestos lung damage.  Marland Kitchen GERD  (gastroesophageal reflux disease)   . H/O hiatal hernia   . Heart murmur   . Hyperlipidemia   . Hypertension   . Nephrolithiasis   . Paroxysmal atrial fibrillation (HCC)    ASA 325 + plavix as of 01/06/16;  electrophys did placement of Watchman left atrial appendage occlusive device 10/2015.  Marland Kitchen Prediabetes 2015   A1c 6.4%.  A1c 6.1% 08/2015   Past Surgical History:  Procedure Laterality Date  . CARDIAC CATHETERIZATION  06/24/10  . CIRCUMCISION  01/18/2012   Procedure: CIRCUMCISION ADULT;  Surgeon: [name], MD;  Location: [facility];  Service: Urology;  Laterality: N/A;  30 mins requested for this case   . COLONOSCOPY    . CORONARY ANGIOGRAPHY N/A 04/13/2018   Procedure: CORONARY ANGIOGRAPHY (CATH LAB);  Surgeon: [name], MD;  Location: [place] [facility];  Service: Cardiovascular;  Laterality: N/A;  . EEG  01/2016   NORMAL ([place] neurologic)  . ESOPHAGOGASTRODUODENOSCOPY  02/23/2011   Barrett's esophagus, hiatal hernia - dilated for dysphagia  . Implantable loop recorder placement  06/29/14   MDT LINQ implanted by Dr [name] in the office as part of the RIO II protocol. + A fib confirmation.  Marland Kitchen LEFT ATRIAL APPENDAGE OCCLUSION N/A 10/31/2015   Procedure: LEFT ATRIAL APPENDAGE OCCLUSION;  Surgeon: [name], MD;  Location: [place] [facility];  Service: Cardiovascular;  Laterality: N/A;  . LEFT ATRIAL APPENDAGE OCCLUSION    . SHOULDER SURGERY Right    Arthroscopic 02/23/12  . TEE WITHOUT CARDIOVERSION N/A  07/25/2015   Dr. [name]: EF 60-65%, normal LV wall motion, functionally bicuspid aortic valve with mild AI and mild AS, ascending aorta dilated to 4 cm, left atrial appendage measured for Watchman procedure/device  . TEE WITHOUT CARDIOVERSION N/A 12/27/2015   Procedure: TRANSESOPHAGEAL ECHOCARDIOGRAM (TEE);  Surgeon: Dorothy Spark, MD;  Location: [facility];  Service: Cardiovascular;  Laterality: N/A;  . TENDON REPAIR  11/06/2011   Procedure: TENDON  REPAIR;  Surgeon: Cammie Sickle., MD;  Location: [place];  Service: Orthopedics;  Laterality: Left;  explore/repair tendons left hand   . WATCHMAN PLACEMENT  10/31/15   Left atrial appendage occlusive device     Current Outpatient Medications  Medication Sig Dispense Refill  . amLODipine (NORVASC) 10 MG tablet Take 1 tablet by mouth once daily 90 tablet 1  . apixaban (ELIQUIS) 5 MG TABS tablet Take 1 tablet (5 mg total) by mouth 2 (two) times daily. 60 tablet 6  . atorvastatin (LIPITOR) 40 MG tablet Take 1 tablet by mouth once daily 90 tablet 1  . benazepril (LOTENSIN) 20 MG tablet Take 1 tablet by mouth once daily 90 tablet 0  . metFORMIN (GLUCOPHAGE-XR) 500 MG 24 hr tablet TAKE 2 TABLETS BY MOUTH ONCE DAILY WITH BREAKFAST 180 tablet 0  . metoprolol succinate (TOPROL-XL) 25 MG 24 hr tablet Take 1/2 (one-half) tablet by mouth once daily 45 tablet 0  . pantoprazole (PROTONIX) 40 MG tablet Take 40 mg by mouth daily.      No current facility-administered medications for this visit.     No Known Allergies  Social History   Socioeconomic History  . Marital status: Married    Spouse name: Not on file  . Number of children: 2  . Years of education: Not on file  . Highest education level: Not on file  Occupational History  . Occupation: Self employed    Employer: SELF EMPLOYED  Social Needs  . Financial resource strain: Not on file  . Food insecurity    Worry: Not on file    Inability: Not on file  . Transportation needs    Medical: Not on file    Non-medical: Not on file  Tobacco Use  . Smoking status: Former Smoker    Packs/day: 0.50    Years: 10.00    Pack years: 5.00    Types: Cigarettes    Quit date: 11/03/1993    Years since quitting: 25.2  . Smokeless tobacco: Never Used  Substance and Sexual Activity  . Alcohol use: No  . Drug use: No  . Sexual activity: Yes  Lifestyle  . Physical activity    Days per week: Not on file    Minutes per  session: Not on file  . Stress: Not on file  Relationships  . Social Herbalist on phone: Not on file    Gets together: Not on file    Attends religious service: Not on file    Active member of club or organization: Not on file    Attends meetings of clubs or organizations: Not on file    Relationship status: Not on file  . Intimate partner violence    Fear of current or ex partner: Not on file    Emotionally abused: Not on file    Physically abused: Not on file    Forced sexual activity: Not on file  Other Topics Concern  . Not on file  Social History Narrative   Married, 2 daughters.  Orig from [place].   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   No T/A/Ds.           Family History  Problem Relation Age of Onset  . Arthritis Mother   . Diabetes Mother   . Stroke Mother        In her 4s  . Heart attack Father 25  . Asthma Father   . Emphysema Father   . Heart disease Father   . Hypertension Father   . Hypertension Sister   . Hypertension Brother   . Hyperlipidemia Neg Hx   . Stomach cancer Neg Hx   . Rectal cancer Neg Hx   . Colon cancer Neg Hx   . Colon polyps Neg Hx   . Esophageal cancer Neg Hx     ROS- All systems are reviewed and negative except as per the HPI above  Physical Exam: There were no vitals filed for this visit. Wt Readings from Last 3 Encounters:  08/24/18 196 lb 3.2 oz (89 kg)  05/16/18 207 lb 12.8 oz (94.3 kg)  04/13/18 195 lb (88.5 kg)    Labs: Lab Results  Component Value Date   NA 138 08/24/2018   K 4.8 08/24/2018   CL 105 08/24/2018   CO2 25 08/24/2018   GLUCOSE 113 (H) 08/24/2018   BUN 23 08/24/2018   CREATININE 1.23 08/24/2018   CALCIUM 9.6 08/24/2018   Lab Results  Component Value Date   INR 1.28 04/08/2018   Lab Results  Component Value Date   CHOL 143 08/24/2018   HDL 49.40 08/24/2018   LDLCALC 79 08/24/2018   TRIG 74.0 08/24/2018    GEN- The  patient is well appearing, alert and oriented x 3 today.   HEENT-head normocephalic, atraumatic, sclera clear, conjunctiva pink, hearing intact, trachea midline. Lungs- Clear to ausculation bilaterally, normal work of breathing Heart- irregular rate and rhythm, no murmurs, rubs or gallops  GI- soft, NT, ND, + BS Extremities- no clubbing, cyanosis, or edema MS- no significant deformity or atrophy Skin- no rash or lesion Psych- euthymic mood, full affect Neuro- strength and sensation are intact   EKG- atrial fibrillation HR 116, incRBBB, QRS 94, QTc 458  Epic records reviewed  Echo 04/08/18 - Left ventricle: The cavity size was normal. Wall thickness was   increased in a pattern of mild LVH. Systolic function was normal.   The estimated ejection fraction was in the range of 60% to 65%.   Wall motion was normal; there were no regional wall motion   abnormalities. Features are consistent with a pseudonormal left   ventricular filling pattern, with concomitant abnormal relaxation   and increased filling pressure (grade 2 diastolic dysfunction). - Aortic valve: Possibly bicuspid; severely calcified leaflets.   There was mild regurgitation. Mean gradient (S): 11 mm Hg. Valve   area (VTI): 1.54 cm^2. - Aorta: Mildly dilated ascending aorta. Ascending aortic diameter:   40 mm (S). - Mitral valve: There was trivial regurgitation. - Left atrium: The atrium was mildly to moderately dilated. - Right ventricle: The cavity size was normal. Systolic function   was normal. - Tricuspid valve: Peak RV-RA gradient (S): 16 mm Hg. - Pulmonary arteries: PA peak pressure: 19 mm Hg (S). - Inferior vena cava: The vessel was normal in size. The   respirophasic diameter changes were in the normal range (>= 50%),   consistent with normal central venous pressure.  Impressions:  -  Normal LV size with mild LV hypertrophy. EF 60-65%. Normal RV   size and systolic function. Bicuspid aortic valve with mild    stenosis by mean gradient and calculated valve area, visually   looks like at least moderate stenosis. Mild AI.   Assessment and Plan: 1. Paroxysmal atrial fibrillation S/p Watchman device 2017 He will hold amlodipine and benazepril and increase  metoprolol tartrate to 25 mg AM and 37.5 mg PM for rate control. Will arrange for DCCV after 3 weeks of uninterrupted anticoagulation.  Check Bmet/CBC prior to DCCV. We discussed possibility of AAD therapy. Patient would like to avoid for now given infrequency of episodes.  Continue Eliquis 5 mg BID  This patients CHA2DS2-VASc Score and unadjusted Ischemic Stroke Rate (% per year) is equal to 4.8 % stroke rate/year from a score of 4  Above score calculated as 1 point each if present [CHF, HTN, DM, Vascular=MI/PAD/Aortic Plaque, Age if 65-74, or Male] Above score calculated as 2 points each if present [Age > 75, or Stroke/TIA/TE]   2. HTN Stable, med changes as above.  3 CAD No anginal symptoms. LHC (1/12): 60-70% mLAD stenosis. Continue present therapy and risk factor modification.   4. Aortic valve disease/bicuspid valve Followed by Dr [name]. Plan for repeat echo 2021.   F/u in AF clinic for labs prior to DCCV. Follow up for office visit one week after.    [facility] [address] [place], [place] [address] [phone]

## 2019-01-17 ENCOUNTER — Encounter (HOSPITAL_COMMUNITY): Payer: Self-pay

## 2019-01-20 ENCOUNTER — Telehealth (HOSPITAL_COMMUNITY): Payer: Self-pay | Admitting: *Deleted

## 2019-01-20 ENCOUNTER — Other Ambulatory Visit (HOSPITAL_COMMUNITY): Payer: Self-pay | Admitting: *Deleted

## 2019-01-20 DIAGNOSIS — I480 Paroxysmal atrial fibrillation: Secondary | ICD-10-CM

## 2019-01-20 MED ORDER — METOPROLOL SUCCINATE ER 25 MG PO TB24: 12.5000 mg | ORAL_TABLET | Freq: Two times a day (BID) | ORAL | 0 refills | Status: DC

## 2019-01-20 NOTE — Telephone Encounter (Signed)
Patient called in stating he went back into normal rhythm after increasing dose of metoprolol - Hr in the 59-64 range. Patient self decreased metoprolol to 12.5mg  BID - his bp has been elevated in the 150/80s - he will monitor this and let us know next week if continues to be elevated. DCCV canceled. Will make sure he has follow up in next few months with Dr. [name] as recall is in for June of this year.

## 2019-01-27 ENCOUNTER — Other Ambulatory Visit (HOSPITAL_COMMUNITY): Payer: Self-pay | Admitting: Cardiology

## 2019-01-27 ENCOUNTER — Other Ambulatory Visit (HOSPITAL_COMMUNITY): Payer: Self-pay

## 2019-01-27 DIAGNOSIS — I480 Paroxysmal atrial fibrillation: Secondary | ICD-10-CM

## 2019-01-27 MED ORDER — METOPROLOL SUCCINATE ER 25 MG PO TB24: 25.0000 mg | ORAL_TABLET | Freq: Every day | ORAL | 1 refills | Status: DC

## 2019-02-03 ENCOUNTER — Other Ambulatory Visit (HOSPITAL_COMMUNITY): Payer: Medicare Other

## 2019-02-06 DIAGNOSIS — D225 Melanocytic nevi of trunk: Secondary | ICD-10-CM | POA: Diagnosis not present

## 2019-02-06 DIAGNOSIS — Z85828 Personal history of other malignant neoplasm of skin: Secondary | ICD-10-CM | POA: Diagnosis not present

## 2019-02-06 DIAGNOSIS — L570 Actinic keratosis: Secondary | ICD-10-CM | POA: Diagnosis not present

## 2019-02-06 DIAGNOSIS — L821 Other seborrheic keratosis: Secondary | ICD-10-CM | POA: Diagnosis not present

## 2019-02-06 DIAGNOSIS — D1801 Hemangioma of skin and subcutaneous tissue: Secondary | ICD-10-CM | POA: Diagnosis not present

## 2019-02-06 DIAGNOSIS — L820 Inflamed seborrheic keratosis: Secondary | ICD-10-CM | POA: Diagnosis not present

## 2019-02-07 ENCOUNTER — Encounter (HOSPITAL_COMMUNITY): Admission: RE | Payer: Self-pay | Source: Home / Self Care

## 2019-02-07 ENCOUNTER — Ambulatory Visit (HOSPITAL_COMMUNITY): Admission: RE | Admit: 2019-02-07 | Payer: Medicare Other | Source: Home / Self Care | Admitting: Cardiology

## 2019-02-07 ENCOUNTER — Other Ambulatory Visit (HOSPITAL_COMMUNITY): Payer: Medicare Other | Admitting: Physician Assistant

## 2019-02-07 SURGERY — CARDIOVERSION
Anesthesia: General

## 2019-02-24 NOTE — Progress Notes (Signed)
Samuel Willis is a 73 y.o. male is here for follow up.  History of Present Illness:   Samuel Willis, CMA acting as scribe for Dr. Briscoe Deutscher.   HPI: Patient has had bilateral wax build up. He dose have tube in right ear. Has tried to get an appointment with ENT but has been cancelled twice by provider would like referral to new ENT.   Health Maintenance Due  Topic Date Due  . OPHTHALMOLOGY EXAM  08/07/1955  . PNA vac Low Risk Adult (2 of 2 - PPSV23) 11/24/2015  . FOOT EXAM  08/05/2018  . HEMOGLOBIN A1C  02/24/2019   Depression screen [facility] 2/9 02/27/2019 02/02/2018 09/17/2016  Decreased Interest 0 0 0  Down, Depressed, Hopeless 0 0 0  PHQ - 2 Score 0 0 0  Altered sleeping 0 - -  Tired, decreased energy 0 - -  Change in appetite 0 - -  Feeling bad or failure about yourself  0 - -  Trouble concentrating 0 - -  Moving slowly or fidgety/restless 0 - -  Suicidal thoughts 0 - -  PHQ-9 Score 0 - -  Difficult doing work/chores Not difficult at all - -   PMHx, SurgHx, SocialHx, FamHx, Medications, and Allergies were reviewed in the Visit Navigator and updated as appropriate.   Patient Active Problem List   Diagnosis Date Noted  . Bilateral impacted cerumen 12/21/2018  . Sensorineural hearing loss (SNHL), bilateral, followed by Dr. Janace Hoard 04/06/2018  . Eustachian tube dysfunction, bilateral -- R TM with tympanostamy tube 04/05/2018  . MCI (mild cognitive impairment) 01/09/2016  . Bicuspid aortic valve   . Aortic insufficiency   . Aortic stenosis   . Ascending aortic aneurysm ([place])   . Paroxysmal atrial fibrillation ([place]) 10/31/2015  . Type 2 diabetes mellitus with other specified complication ([place]), controlled with Metformin 06/26/2013  . Asbestos exposure 06/26/2013  . CAD (coronary artery disease) 07/26/2011  . Barrett's esophagus, last EGD 01/2018, 3 cm, no dysplasia, repeat 3-5 years 02/23/2011  . GERD (gastroesophageal reflux disease) 01/06/2011  . Esophageal dysphagia  11/30/2010  . Aneurysm of thoracic aorta ([place]) 03/20/2010  . Hyperlipidemia, controlled with Lipitor, LDL goal < 70 07/16/2008  . Hypertension associated with diabetes ([place]) 07/16/2008   Social History   Tobacco Use  . Smoking status: Former Smoker    Packs/day: 0.50    Years: 10.00    Pack years: 5.00    Types: Cigarettes    Quit date: 11/03/1993    Years since quitting: 25.3  . Smokeless tobacco: Never Used  Substance Use Topics  . Alcohol use: No  . Drug use: No   Current Medications and Allergies   .  amLODipine (NORVASC) 10 MG tablet, Take 1 tablet by mouth once daily, Disp: 90 tablet, Rfl: 1 .  apixaban (ELIQUIS) 5 MG TABS tablet, Take 1 tablet (5 mg total) by mouth 2 (two) times daily., Disp: 60 tablet, Rfl: 6 .  atorvastatin (LIPITOR) 40 MG tablet, Take 1 tablet by mouth once daily, Disp: 90 tablet, Rfl: 1 .  benazepril (LOTENSIN) 20 MG tablet, Take 1 tablet by mouth once daily, Disp: 90 tablet, Rfl: 0 .  metFORMIN (GLUCOPHAGE-XR) 500 MG 24 hr tablet, TAKE 2 TABLETS BY MOUTH ONCE DAILY WITH BREAKFAST, Disp: 180 tablet, Rfl: 0 .  metoprolol succinate (TOPROL-XL) 25 MG 24 hr tablet, Take 1 tablet (25 mg total) by mouth daily., Disp: 30 tablet, Rfl: 1 .  pantoprazole (PROTONIX) 40 MG tablet, Take 40  mg by mouth daily. , Disp: , Rfl:   No Known Allergies   Review of Systems   Pertinent items are noted in the HPI. Otherwise, a complete ROS is negative.  Vitals   Vitals:   02/27/19 0747  BP: 140/64  Pulse: (!) 55  Temp: 97.8 F (36.6 C)  TempSrc: Temporal  SpO2: 97%  Weight: 203 lb (92.1 kg)  Height: 5\' 11"  (1.803 m)     Body mass index is 28.31 kg/m.  Physical Exam   Physical Exam Vitals signs and nursing note reviewed.  Constitutional:      General: He is not in acute distress.    Appearance: He is well-developed.  HENT:     Head: Normocephalic and atraumatic.     Right Ear: External ear normal. Decreased hearing noted.     Left Ear: External ear  normal. Decreased hearing noted.     Ears:     Comments: Unable to visualize the right TM. Cannot see TM tube.     Nose: Nose normal.  Eyes:     Conjunctiva/sclera: Conjunctivae normal.     Pupils: Pupils are equal, round, and reactive to light.  Neck:     Musculoskeletal: Neck supple.  Cardiovascular:     Rate and Rhythm: Normal rate and regular rhythm.  Pulmonary:     Effort: Pulmonary effort is normal.  Abdominal:     General: Bowel sounds are normal.     Palpations: Abdomen is soft.  Musculoskeletal: Normal range of motion.  Skin:    General: Skin is warm.  Neurological:     Mental Status: He is alert.  Psychiatric:        Behavior: Behavior normal.    Assessment and Plan   Samuel Willis was seen today for follow-up.  Diagnoses and all orders for this visit:  Type 2 diabetes mellitus with other specified complication, without long-term current use of insulin (HCC) -     Comprehensive metabolic panel -     Hemoglobin A1c  Bilateral impacted cerumen  Hypertension associated with diabetes (HCC)  Medication management -     Vitamin B12  Leukopenia, unspecified type -     CBC with Differential/Platelet  Hyperchylomicronemia -     Lipid panel  Gastroesophageal reflux disease, esophagitis presence not specified  Paroxysmal atrial fibrillation (HCC)  Weak urine stream -     PSA  Eustachian tube dysfunction, bilateral -- R TM with tympanostamy tube -     Ambulatory referral to ENT  Barrett's esophagus without dysplasia  Otalgia, right ear -     Ambulatory referral to ENT    . Orders and follow up as documented in [place], reviewed diet, exercise and weight control, cardiovascular risk and specific lipid/LDL goals reviewed, reviewed medications and side effects in detail.  . Reviewed expectations re: course of current medical issues. . Outlined signs and symptoms indicating need for more acute intervention. . Patient verbalized understanding and all questions  were answered. . Patient received an After Visit Summary.  CMA served as Education administrator during this visit. History, Physical, and Plan performed by medical provider. The above documentation has been reviewed and is accurate and complete. Briscoe Deutscher, D.O.  Briscoe Deutscher, DO [place], [place] 02/27/2019   .

## 2019-02-27 ENCOUNTER — Encounter: Payer: Self-pay | Admitting: Family Medicine

## 2019-02-27 ENCOUNTER — Ambulatory Visit: Payer: Medicare Other | Admitting: Family Medicine

## 2019-02-27 ENCOUNTER — Ambulatory Visit (INDEPENDENT_AMBULATORY_CARE_PROVIDER_SITE_OTHER): Payer: Medicare Other | Admitting: Family Medicine

## 2019-02-27 ENCOUNTER — Other Ambulatory Visit: Payer: Self-pay

## 2019-02-27 VITALS — BP 140/64 | HR 55 | Temp 97.8°F | Ht 71.0 in | Wt 203.0 lb

## 2019-02-27 DIAGNOSIS — Z79899 Other long term (current) drug therapy: Secondary | ICD-10-CM | POA: Diagnosis not present

## 2019-02-27 DIAGNOSIS — D72819 Decreased white blood cell count, unspecified: Secondary | ICD-10-CM

## 2019-02-27 DIAGNOSIS — R3912 Poor urinary stream: Secondary | ICD-10-CM | POA: Diagnosis not present

## 2019-02-27 DIAGNOSIS — E783 Hyperchylomicronemia: Secondary | ICD-10-CM

## 2019-02-27 DIAGNOSIS — E1159 Type 2 diabetes mellitus with other circulatory complications: Secondary | ICD-10-CM

## 2019-02-27 DIAGNOSIS — H6123 Impacted cerumen, bilateral: Secondary | ICD-10-CM

## 2019-02-27 DIAGNOSIS — H6983 Other specified disorders of Eustachian tube, bilateral: Secondary | ICD-10-CM | POA: Diagnosis not present

## 2019-02-27 DIAGNOSIS — K2270 Barrett's esophagus without dysplasia: Secondary | ICD-10-CM

## 2019-02-27 DIAGNOSIS — E1169 Type 2 diabetes mellitus with other specified complication: Secondary | ICD-10-CM | POA: Diagnosis not present

## 2019-02-27 DIAGNOSIS — I152 Hypertension secondary to endocrine disorders: Secondary | ICD-10-CM

## 2019-02-27 DIAGNOSIS — K219 Gastro-esophageal reflux disease without esophagitis: Secondary | ICD-10-CM

## 2019-02-27 DIAGNOSIS — I10 Essential (primary) hypertension: Secondary | ICD-10-CM

## 2019-02-27 DIAGNOSIS — H9201 Otalgia, right ear: Secondary | ICD-10-CM | POA: Diagnosis not present

## 2019-02-27 DIAGNOSIS — I480 Paroxysmal atrial fibrillation: Secondary | ICD-10-CM | POA: Diagnosis not present

## 2019-02-27 DIAGNOSIS — H6993 Unspecified Eustachian tube disorder, bilateral: Secondary | ICD-10-CM

## 2019-02-27 LAB — COMPREHENSIVE METABOLIC PANEL
ALT: 22 U/L (ref 0–53)
AST: 17 U/L (ref 0–37)
Albumin: 4.4 g/dL (ref 3.5–5.2)
Alkaline Phosphatase: 52 U/L (ref 39–117)
BUN: 20 mg/dL (ref 6–23)
CO2: 27 mEq/L (ref 19–32)
Calcium: 9.5 mg/dL (ref 8.4–10.5)
Chloride: 108 mEq/L (ref 96–112)
Creatinine, Ser: 1.08 mg/dL (ref 0.40–1.50)
GFR: 67.1 mL/min (ref >60.00)
Glucose, Bld: 114 mg/dL — ABNORMAL HIGH (ref 70–99)
Potassium: 4.5 mEq/L (ref 3.5–5.1)
Sodium: 141 mEq/L (ref 135–145)
Total Bilirubin: 0.5 mg/dL (ref 0.2–1.2)
Total Protein: 6.5 g/dL (ref 6.0–8.3)

## 2019-02-27 LAB — CBC WITH DIFFERENTIAL/PLATELET
Basophils Absolute: 0 10*3/uL (ref 0.0–0.1)
Basophils Relative: 0.8 % (ref 0.0–3.0)
Eosinophils Absolute: 0.2 10*3/uL (ref 0.0–0.7)
Eosinophils Relative: 4 % (ref 0.0–5.0)
HCT: 42.7 % (ref 39.0–52.0)
Hemoglobin: 14.4 g/dL (ref 13.0–17.0)
Lymphocytes Relative: 23.9 % (ref 12.0–46.0)
Lymphs Abs: 1 10*3/uL (ref 0.7–4.0)
MCHC: 33.7 g/dL (ref 30.0–36.0)
MCV: 95 fl (ref 78.0–100.0)
Monocytes Absolute: 0.4 10*3/uL (ref 0.1–1.0)
Monocytes Relative: 8.4 % (ref 3.0–12.0)
Neutro Abs: 2.7 10*3/uL (ref 1.4–7.7)
Neutrophils Relative %: 62.9 % (ref 43.0–77.0)
Platelets: 185 10*3/uL (ref 150.0–400.0)
RBC: 4.49 Mil/uL (ref 4.22–5.81)
RDW: 13 % (ref 11.5–15.5)
WBC: 4.3 10*3/uL (ref 4.0–10.5)

## 2019-02-27 LAB — LIPID PANEL
Cholesterol: 142 mg/dL (ref 0–200)
HDL: 42.7 mg/dL (ref >39.00)
LDL Cholesterol: 84 mg/dL (ref 0–99)
NonHDL: 99.23
Total CHOL/HDL Ratio: 3
Triglycerides: 75 mg/dL (ref 0.0–149.0)
VLDL: 15 mg/dL (ref 0.0–40.0)

## 2019-02-27 LAB — HEMOGLOBIN A1C: Hgb A1c MFr Bld: 6.4 % (ref 4.6–6.5)

## 2019-02-27 LAB — PSA: PSA: 2.62 ng/mL (ref 0.10–4.00)

## 2019-02-27 LAB — VITAMIN B12: Vitamin B-12: 353 pg/mL (ref 211–911)

## 2019-02-27 IMAGING — MR MR MRA CHEST W/ OR W/O CM
11 series · 16 of 16 positions shown · IV contrast (20 MH)
Comparison: CT 09/09/2015, MR 07/17/2013 and previous

CLINICAL DATA: Bicuspid aortic valve with aortic stenosis, and
dilated aortic root

EXAM:
MRA CHEST WITH OR WITHOUT CONTRAST
TECHNIQUE: Angiographic images of the chest were obtained using MRA technique
without and with intravenous contrast.
CONTRAST:  20mL MULTIHANCE GADOBENATE DIMEGLUMINE 529 MG/ML IV SOLN

[Series 3: bSSFP · axial · 8.0mm · 0.86mm/px · 1 of 50 slices shown (1 of 4)]
[im 1/50]
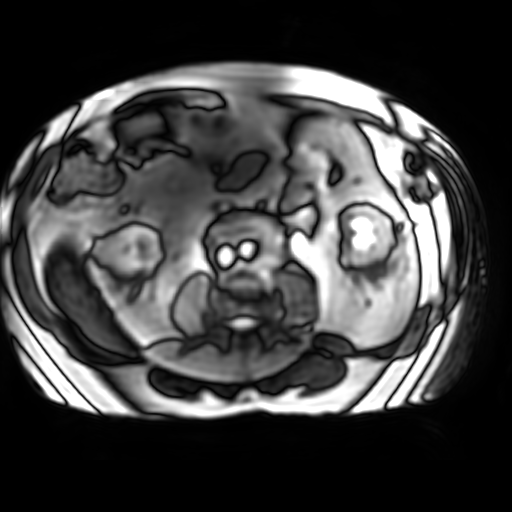

[Series 4: bSSFP · coronal · 8.0mm · 0.86mm/px · 1 of 19 slices shown (2 of 4)]
[im 1/19]
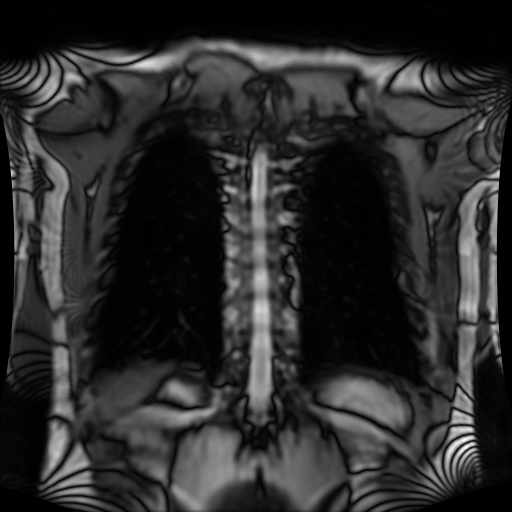

[Series 6: T1 · axial · 8.0mm · 0.82mm/px · 1 of 5 slices shown]
[im 1/5]
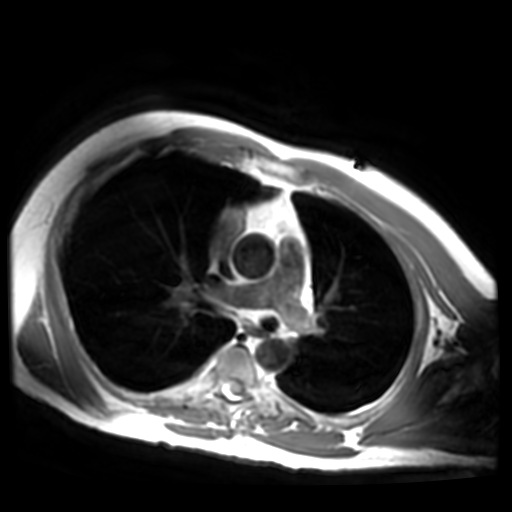

[Series 8: bSSFP · oblique · 6.0mm · 0.78mm/px · 3 of 180 slices shown (3 of 4)]
[im 1/180]
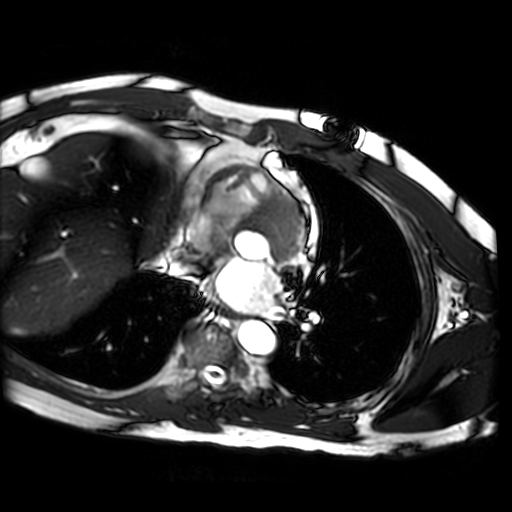
[im 90/180]
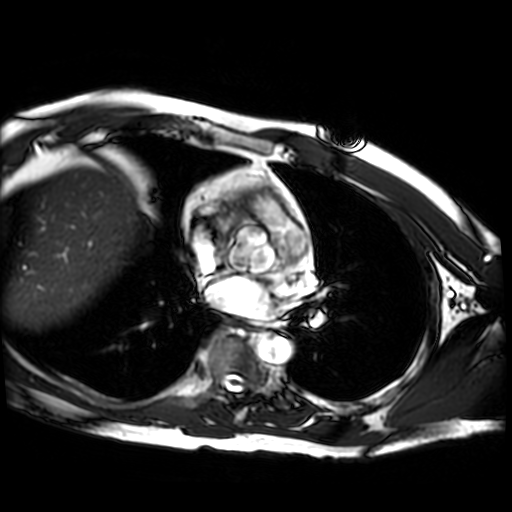
[im 180/180]
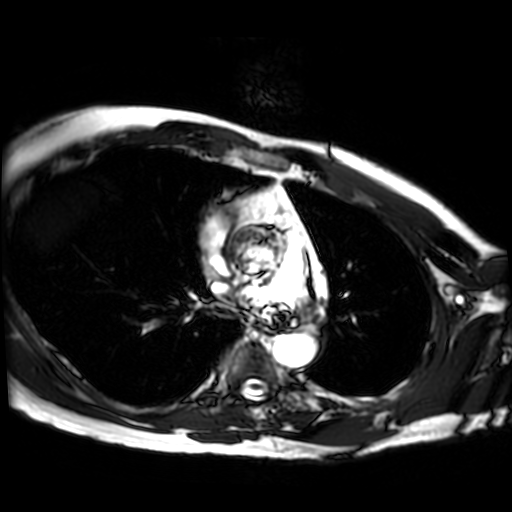

[Series 9: bSSFP · sagittal · 8.0mm · 0.78mm/px · 4 of 180 slices shown (4 of 4)]
[im 1/180]
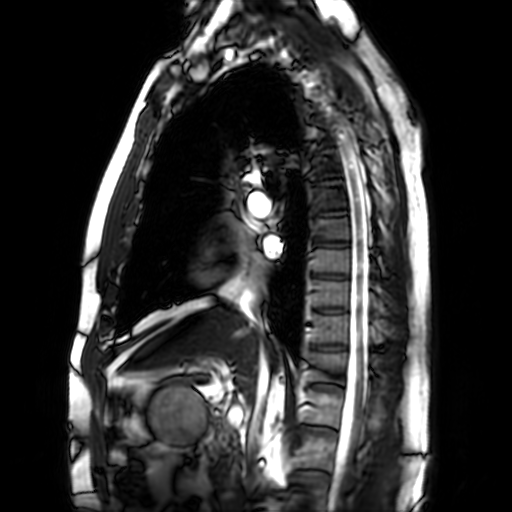
[im 60/180]
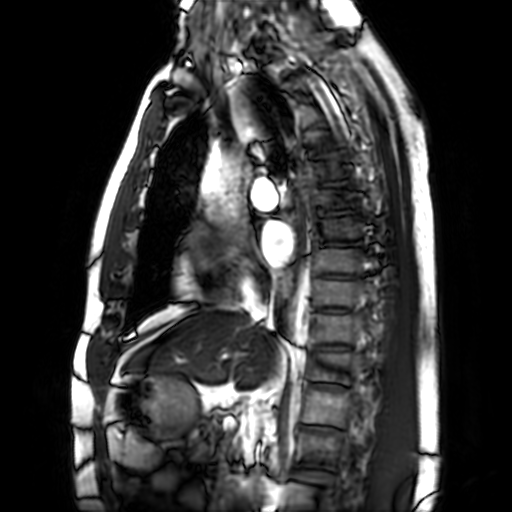
[im 120/180]
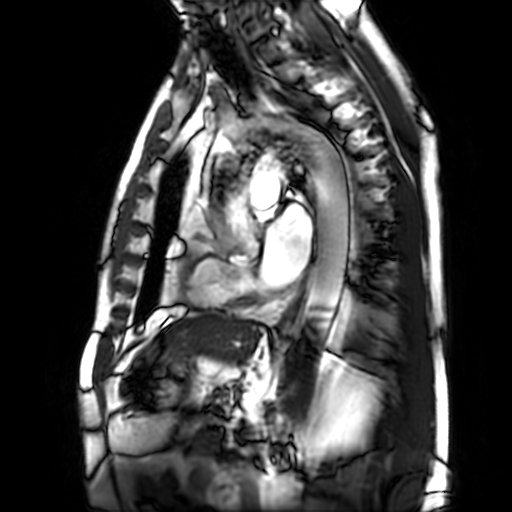
[im 180/180]
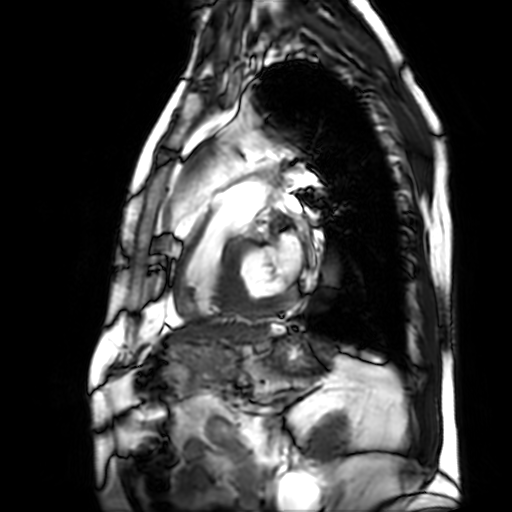

[Series 11: T1 dynamic · axial · 5.0mm · 0.86mm/px · 1 of 52 slices shown (1 of 2)]
[im 1/52]
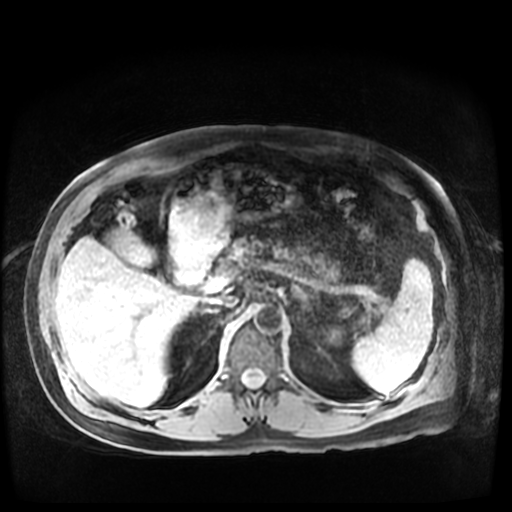

[Series 12: T1 dynamic · coronal · 4.0mm · 0.78mm/px · 1 of 44 slices shown (2 of 2)]
[im 1/44]
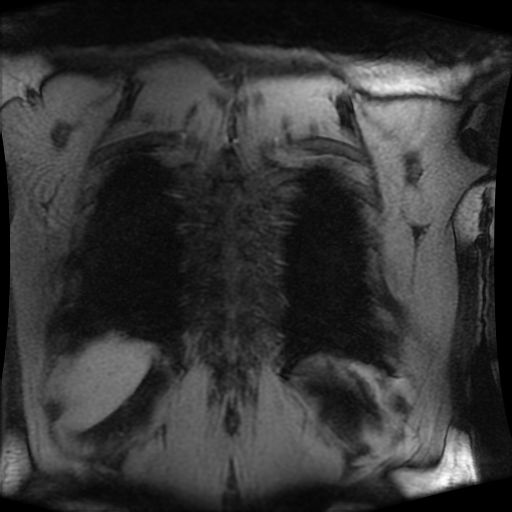

[Series 14: T1 dynamic post-contrast · axial · 5.0mm · 0.86mm/px · 1 of 52 slices shown (1 of 2)]
[im 1/52]
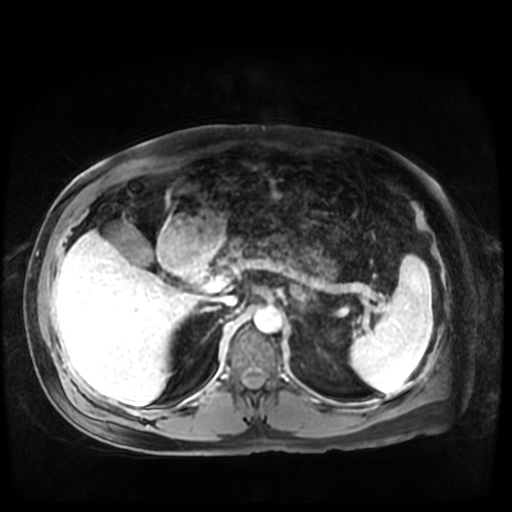

[Series 15: T1 dynamic post-contrast · coronal · 4.0mm · 0.78mm/px · 1 of 44 slices shown (2 of 2)]
[im 1/44]
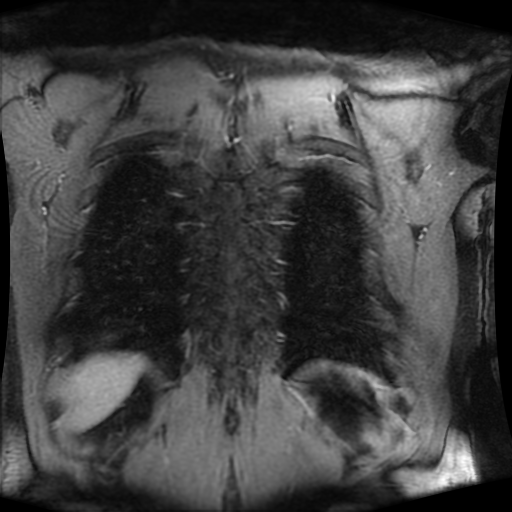

[((id)/(id)/1)-((id)/(id)/1) · sagittal · 3.0mm · 0.78mm/px · 1 of 60 slices shown]
[im 1/60]
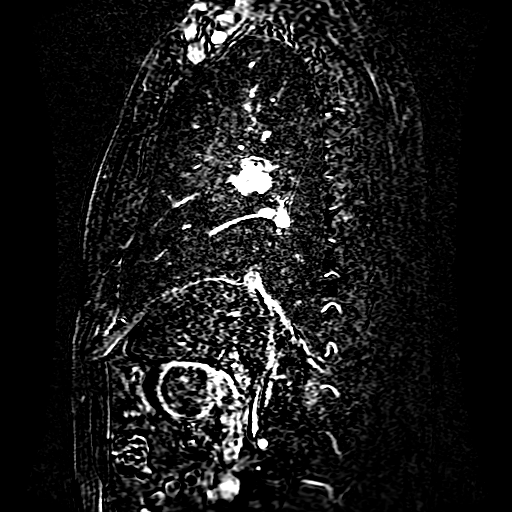

[processed images · sagittal · 3.0mm · 0.88mm/px · 1 of 6 slices shown]
[im 1/6]
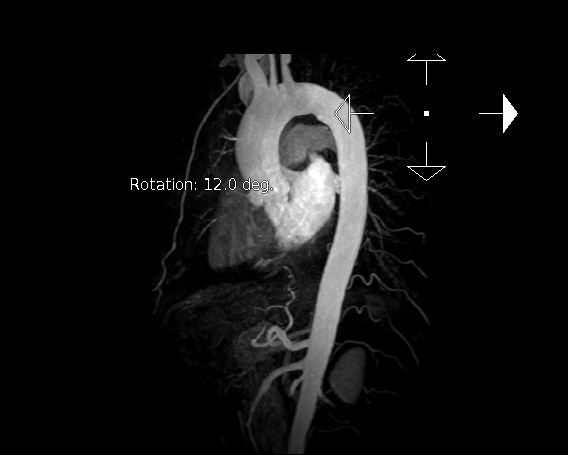

[16 of 16 positions shown; findings below may reference images not displayed]

FINDINGS: VASCULAR

Aorta: No aneurysm. 3.8 cm maximum diameter, ascending segment.
Classic 3 vessel brachiocephalic arterial origin anatomy without
proximal stenosis. No significant atheromatous irregularity or
dissection.

Heart: Normal in caliber. Bicuspid aortic valve with stenosis
resulting in post stenotic turbulence .

Pulmonary Arteries:  Unremarkable

Other: No pleural or pericardial effusion.

NON-VASCULAR

Spinal cord: Negative limited evaluation.

Brachial plexus: Unremarkable

Muscles and tendons: No abnormality identified

Bones: Unremarkable

Joints: Unremarkable
IMPRESSION: VASCULAR

1. Ectatic ascending aorta measured 3.8 cm diameter, stable.
2. Bicuspid aortic valve.

NON-VASCULAR

1. No acute findings.

## 2019-03-05 ENCOUNTER — Other Ambulatory Visit: Payer: Self-pay | Admitting: Family Medicine

## 2019-03-20 DIAGNOSIS — H903 Sensorineural hearing loss, bilateral: Secondary | ICD-10-CM | POA: Diagnosis not present

## 2019-03-20 DIAGNOSIS — H6123 Impacted cerumen, bilateral: Secondary | ICD-10-CM | POA: Diagnosis not present

## 2019-03-20 DIAGNOSIS — J310 Chronic rhinitis: Secondary | ICD-10-CM | POA: Diagnosis not present

## 2019-03-24 ENCOUNTER — Other Ambulatory Visit (HOSPITAL_COMMUNITY): Payer: Self-pay | Admitting: Cardiology

## 2019-03-24 ENCOUNTER — Other Ambulatory Visit: Payer: Self-pay | Admitting: Family Medicine

## 2019-03-24 DIAGNOSIS — E785 Hyperlipidemia, unspecified: Secondary | ICD-10-CM

## 2019-03-24 DIAGNOSIS — I480 Paroxysmal atrial fibrillation: Secondary | ICD-10-CM

## 2019-03-30 ENCOUNTER — Telehealth: Payer: Self-pay | Admitting: Family Medicine

## 2019-03-30 ENCOUNTER — Other Ambulatory Visit: Payer: Self-pay

## 2019-03-30 DIAGNOSIS — E785 Hyperlipidemia, unspecified: Secondary | ICD-10-CM

## 2019-03-30 MED ORDER — ATORVASTATIN CALCIUM 40 MG PO TABS: 40.0000 mg | ORAL_TABLET | Freq: Every day | ORAL | 1 refills | Status: DC

## 2019-03-30 MED ORDER — METFORMIN HCL ER 500 MG PO TB24: ORAL_TABLET | ORAL | 0 refills | Status: DC

## 2019-03-30 NOTE — Telephone Encounter (Signed)
Rx sent 

## 2019-03-30 NOTE — Telephone Encounter (Signed)
°  LAST APPOINTMENT DATE: 02/27/19   NEXT APPOINTMENT DATE:@3 /02/2020  MEDICATION:metFORMIN (GLUCOPHAGE-XR) 500 MG 24 hr tablet  atorvastatin (LIPITOR) 40 MG tablet   [place], [place] [id number]  Patient is requesting refills of these medications, he will be out by this coming Sunday.    Let patient know to contact pharmacy at the end of the day to make sure medication is ready.    Please notify patient to allow 48-72 hours to process  Encourage patient to contact the pharmacy for refills or they can request refills through [facility]:   LAST REFILL:  QTY:  REFILL DATE:    OTHER COMMENTS:    Okay for refill?  Please advise

## 2019-04-12 ENCOUNTER — Telehealth (HOSPITAL_COMMUNITY): Payer: Self-pay

## 2019-04-12 DIAGNOSIS — H35373 Puckering of macula, bilateral: Secondary | ICD-10-CM | POA: Diagnosis not present

## 2019-04-12 DIAGNOSIS — I10 Essential (primary) hypertension: Secondary | ICD-10-CM

## 2019-04-12 NOTE — Telephone Encounter (Signed)
BP elevated to 178/80's 3 weeks ago .  Pt reports that he doubled up on his bp medication benazpril for about a week to help manage bp.  In last few days bp is 150/80s'.  No symptoms with elevated BP.  Pt only reports being more fatigued than usual.  Pt says he watches diet, mild stress. Per pt he is due for an annual in December with an (not scheduled yet).  Pt said if he cannot be seen in a reasonable time frame to refer him elsewhere. Please advise.

## 2019-04-12 NOTE — Telephone Encounter (Signed)
Make sure he is still taking metoprolol and amlodipine 10 mg daily. If he has been taking benazepril 20 mg daily, have him increase to 40 mg daily. Will need bmet 10 days. Tell him to check BP daily & work him in with me in the next couple weeks.

## 2019-04-13 ENCOUNTER — Encounter (HOSPITAL_COMMUNITY): Payer: Self-pay

## 2019-04-13 MED ORDER — BENAZEPRIL HCL 40 MG PO TABS: 40.0000 mg | ORAL_TABLET | Freq: Every day | ORAL | 5 refills | Status: DC

## 2019-04-13 NOTE — Telephone Encounter (Signed)
Per MD pt to increase benazapril to 40mg  daily and repeat blood work on November 9th with MD appt on 11/12.  Pt given details and amenable to plan. Verbalized understanding. Message sent in [place] with appt dates and garage codes

## 2019-04-17 DIAGNOSIS — H25812 Combined forms of age-related cataract, left eye: Secondary | ICD-10-CM | POA: Diagnosis not present

## 2019-04-17 DIAGNOSIS — H43812 Vitreous degeneration, left eye: Secondary | ICD-10-CM | POA: Diagnosis not present

## 2019-04-19 ENCOUNTER — Telehealth: Payer: Self-pay | Admitting: Family Medicine

## 2019-04-19 ENCOUNTER — Other Ambulatory Visit: Payer: Self-pay

## 2019-04-19 MED ORDER — AMLODIPINE BESYLATE 10 MG PO TABS: 10.0000 mg | ORAL_TABLET | Freq: Every day | ORAL | 0 refills | Status: DC

## 2019-04-19 NOTE — Telephone Encounter (Signed)
Patient is completely is out, patient does have a TOC appt with Dr. Jerline Pain for 08/22/19.  amLODipine (NORVASC) 10 MG tablet   [place], [place] [id number] [phone] (Phone) [phone] (Fax)

## 2019-04-24 ENCOUNTER — Ambulatory Visit (HOSPITAL_BASED_OUTPATIENT_CLINIC_OR_DEPARTMENT_OTHER)
Admission: RE | Admit: 2019-04-24 | Discharge: 2019-04-24 | Disposition: A | Discharge status: Home / Self Care | Payer: Medicare Other | Source: Ambulatory Visit | Attending: Cardiology | Admitting: Cardiology

## 2019-04-24 ENCOUNTER — Other Ambulatory Visit: Payer: Self-pay

## 2019-04-24 ENCOUNTER — Other Ambulatory Visit (HOSPITAL_COMMUNITY): Payer: Self-pay | Admitting: *Deleted

## 2019-04-24 ENCOUNTER — Ambulatory Visit (HOSPITAL_COMMUNITY)
Admission: RE | Admit: 2019-04-24 | Discharge: 2019-04-24 | Disposition: A | Discharge status: Home / Self Care | Payer: Medicare Other | Source: Ambulatory Visit | Attending: Cardiology | Admitting: Cardiology

## 2019-04-24 DIAGNOSIS — I25119 Atherosclerotic heart disease of native coronary artery with unspecified angina pectoris: Secondary | ICD-10-CM

## 2019-04-24 DIAGNOSIS — I10 Essential (primary) hypertension: Secondary | ICD-10-CM | POA: Insufficient documentation

## 2019-04-24 LAB — BASIC METABOLIC PANEL
Anion gap: 10 (ref 5–15)
BUN: 16 mg/dL (ref 8–23)
CO2: 22 mmol/L (ref 22–32)
Calcium: 8.9 mg/dL (ref 8.9–10.3)
Chloride: 109 mmol/L (ref 98–111)
Creatinine, Ser: 1.07 mg/dL (ref 0.61–1.24)
GFR calc Af Amer: >60 mL/min (ref >60)
GFR calc non Af Amer: >60 mL/min (ref >60)
Glucose, Bld: 117 mg/dL — ABNORMAL HIGH (ref 70–99)
Potassium: 4.3 mmol/L (ref 3.5–5.1)
Sodium: 141 mmol/L (ref 135–145)

## 2019-04-24 NOTE — Progress Notes (Signed)
  Echocardiogram 2D Echocardiogram has been performed.  Darlina Sicilian M 04/24/2019, 12:17 PM

## 2019-04-25 ENCOUNTER — Telehealth (HOSPITAL_COMMUNITY): Payer: Self-pay

## 2019-04-25 NOTE — Telephone Encounter (Signed)
-----   Message from [name], MD sent at 04/24/2019  5:47 PM EST ----- Normal EF, mild aortic stenosis.  Stable.

## 2019-04-25 NOTE — Telephone Encounter (Signed)
Pt aware of results. Pt will have an appt on Thursday, will discuss more at visit.

## 2019-04-26 ENCOUNTER — Other Ambulatory Visit (HOSPITAL_COMMUNITY): Payer: Self-pay | Admitting: Cardiology

## 2019-04-26 DIAGNOSIS — I480 Paroxysmal atrial fibrillation: Secondary | ICD-10-CM

## 2019-04-27 ENCOUNTER — Ambulatory Visit (HOSPITAL_COMMUNITY)
Admission: RE | Admit: 2019-04-27 | Discharge: 2019-04-27 | Disposition: A | Discharge status: Home / Self Care | Payer: Medicare Other | Source: Ambulatory Visit | Attending: Cardiology | Admitting: Cardiology

## 2019-04-27 ENCOUNTER — Encounter (HOSPITAL_COMMUNITY): Payer: Self-pay | Admitting: Cardiology

## 2019-04-27 ENCOUNTER — Other Ambulatory Visit: Payer: Self-pay

## 2019-04-27 VITALS — BP 132/78 | HR 64 | Wt 204.2 lb

## 2019-04-27 DIAGNOSIS — I2510 Atherosclerotic heart disease of native coronary artery without angina pectoris: Secondary | ICD-10-CM | POA: Insufficient documentation

## 2019-04-27 DIAGNOSIS — Z7901 Long term (current) use of anticoagulants: Secondary | ICD-10-CM | POA: Insufficient documentation

## 2019-04-27 DIAGNOSIS — Z7984 Long term (current) use of oral hypoglycemic drugs: Secondary | ICD-10-CM | POA: Insufficient documentation

## 2019-04-27 DIAGNOSIS — I350 Nonrheumatic aortic (valve) stenosis: Secondary | ICD-10-CM

## 2019-04-27 DIAGNOSIS — Z79899 Other long term (current) drug therapy: Secondary | ICD-10-CM | POA: Insufficient documentation

## 2019-04-27 DIAGNOSIS — I129 Hypertensive chronic kidney disease with stage 1 through stage 4 chronic kidney disease, or unspecified chronic kidney disease: Secondary | ICD-10-CM | POA: Insufficient documentation

## 2019-04-27 DIAGNOSIS — Q231 Congenital insufficiency of aortic valve: Secondary | ICD-10-CM | POA: Diagnosis not present

## 2019-04-27 DIAGNOSIS — N189 Chronic kidney disease, unspecified: Secondary | ICD-10-CM | POA: Insufficient documentation

## 2019-04-27 DIAGNOSIS — R0683 Snoring: Secondary | ICD-10-CM | POA: Diagnosis not present

## 2019-04-27 DIAGNOSIS — E1122 Type 2 diabetes mellitus with diabetic chronic kidney disease: Secondary | ICD-10-CM | POA: Diagnosis not present

## 2019-04-27 DIAGNOSIS — I480 Paroxysmal atrial fibrillation: Secondary | ICD-10-CM

## 2019-04-27 DIAGNOSIS — E785 Hyperlipidemia, unspecified: Secondary | ICD-10-CM | POA: Diagnosis not present

## 2019-04-27 DIAGNOSIS — Z8249 Family history of ischemic heart disease and other diseases of the circulatory system: Secondary | ICD-10-CM | POA: Diagnosis not present

## 2019-04-27 MED ORDER — METOPROLOL SUCCINATE ER 25 MG PO TB24: ORAL_TABLET | ORAL | 0 refills | Status: AC

## 2019-04-27 NOTE — Progress Notes (Signed)
Patient ID: Samuel Willis, male   DOB: [birth year], 73 y.o.   MRN: [id number] PCP: Dr. Anitra Lauth Cardiology: Dr. [name]  73 y.o. with history of bicuspid aortic valve and mild-moderate AS/mild AI on last echo in 6/18 as well as paroxysmal atrial fibrillation presents for followup. He had a Watchman device placed in 5/17. Cardiolite in 8/18 showed no evidence for ischemia/infarction.   He had recurrent atrial fibrillation in 9/19, went back into NSR spontaneously after 4-5 days.  He started apixaban in case he needed DCCV and has stayed on it (had not been willing to take in the past). Weight is down 9 lbs since last appointment.   Echo (10/19) showed EF 60-65%, mild AS, mild AI with bicuspid aortic valve.   LHC was done in 10/19 showing nonobstructive mild coronary disease.   Echo in 11/20 showed EF 60-65%, moderate LVH, mild AS with bicuspid aortic valve.   Patient returns for followup of aortic valve disease and atrial fibrillation.  He had an episode of atrial fibrillation this summer that resolved on its own after about 5 days.  He feels atrial fibrillation when he has it, generally occurs about twice a year. He is very active, no significant exertional dyspnea.  SBP running high, generally in the 140s.  Weight is down 3 lbs. He snores at night and has daytime sleepiness.  He reports increased fatigue generally, which he attributes to Toprol XL.   ECG (personally reviewed): NSR, normal  Labs (12/10): creatinine 1.4  Labs (6/12): K 4.5, creatinine 1.2, LDL 66, HDL 48 Labs (7/14): K 4.5, creatinine 1.3, LDL 71, HDL 49 Labs (1/15): K 4.8, creatinine 1.5, LDL 58, HDL 36 Labs (2/16): K 4.5, creatinine 1.17, LDL 83, HDL 42, TSH normal Labs (3/17): LDL 72, HDL 44 Labs (5/17): K 4.1, creatinine 1.2 Labs (4/18): LDL 92, HDL 37 Labs (8/18): K 5.1, creatinine 1.27, LDL 55, HDL 42 Labs (8/19): LDL 69, HDL 41 Labs (9/19): TSH normal, K 4.7, creatinine 1.22 Labs (9/20): LDL 84 Labs (11/20): K 4.3,  creatinine 1.07  Allergies (verified):  No Known Drug Allergies   Past Medical History:  1.  Atrial fibrillation: Paroxysmal. Patient has a brief episode of irregular heart beating every 1-2 years. One was apparently documented as atrial fibrillation years ago in [employer]. His CHADSVASC score is 3 now that CAD has been found. LINQ monitor was placed and has documented paroxysmal atrial fibrillation. - S/p Watchman placement in 5/17.  2. Hypertension.  3. Hyperlipidemia.  4. CAD: Exercise treadmill Myoview done in January 2010. The patient exercised for 10 minutes and 46 seconds. He stopped due to fatigue. EF was 56%. There was normal perfusion with no evidence for ischemia or infarction. LHC (1/12): 60-70% mLAD stenosis.  ETT-myoview was then done to assess for ischemia in the LAD territory. Patient exercised 10', EF was 69% with no evidence for ischemia or infarction.   - ETT-Cardiolite (8/18): EF 56%, 10 minutes exercise, no evidence for ischemia/infarction (normal).  - LHC (10/19): Nonobstructive mild CAD.  5. Aortic stenosis and aortic regurgitation (bicuspid aortic valve). The patient did have an echocardiogram done on June 26, 2008. EF was 65%. There were no regional wall motion abnormalities. There was moderate aortic regurgitation. There was mild aortic stenosis by mean gradient which was 12 mmHg. There was mild ascending aorta and aortic root dilation. There was mild mitral regurgitation, mild left atrial enlargement, and right atrial enlargement. TEE was done, confirming a functionally bicuspid aortic valve with  fusion of the right and noncoronary cusps. There was mild aortic stenosis and moderate aortic insufficiency. The ascending aorta was mildly dilated at 3.9 cm. TTE (1/10) showed EF 60% with mild LVH, mild AS (mean gradient 12), mild to moderate AR, normal LV size. TTE (1/12) with EF 60%, mild LVH, moderate diastolic dysfunction, mild AS (mean gradient 11 mmHg), mild AI, normal RV.  MRA chest (1/12) with 3.7 cm aortic root and ascending aorta. TTE (2/13): EF 60-65%, mild LVH, mild AS, mild AI, mild MR.  Echo (2/15) with EF 60-65%, bicuspid aortic valve with mild aortic stenosis and mild to moderate AI, mild MR.  MRA chest (2/15) with 4.1 cm aortic root and 4.1 cm ascending aorta.  Echo (4/16) with EF 60-65%, mild AS mean gradient 16 mmHg.   - TEE (7/17) with EF 60-65%, bicuspid aortic valve with mild to moderate AS, moderate AI.  - Echo (6/18): EF 60-65%, moderate LVH, bicuspid aortic valve with mild to moderate AS (mean gradient 12, AVA 1.75 cm^2), mild AI, ascending aorta 4.1 cm.  - MRA chest (6/18): Ascending aorta 3.8 cm - Echo (10/19): EF 60-65%, mild LVH, mild AS, mild AI, bicuspid aortic valve.  - Echo (11/20): EF 60-65%, moderate LVH, normal RV size and systolic function, bicuspid aortic valve with mean gradient 14 mmHg and AVA 2 cm^2.  6. Biceps tendon rupture.  7. CKD 8. Ascending aortic aneurysm: TEE (2/17) with 4.0 cm ascending aorta.  9. Type II diabetes  Family History:  The patient's mother has a history of stroke, also has questionable history of atrial fibrillation. The patient's father had an MI in his 72s.   Social History:  The patient is married, lives in [place], is a nonsmoker. He owns several businesses. He is not drinking alcohol. He works out with a Clinical research associate a couple of days a week and gets a significant amount of cardiovascular exercise.   Review of Systems  All systems reviewed and negative except as per HPI.   Current Outpatient Medications  Medication Sig Dispense Refill  . amLODipine (NORVASC) 10 MG tablet Take 1 tablet (10 mg total) by mouth daily. 90 tablet 0  . apixaban (ELIQUIS) 5 MG TABS tablet Take 1 tablet (5 mg total) by mouth 2 (two) times daily. 60 tablet 6  . atorvastatin (LIPITOR) 40 MG tablet Take 1 tablet by mouth once daily 90 tablet 0  . benazepril (LOTENSIN) 40 MG tablet Take 1 tablet (40 mg total) by mouth daily. 30  tablet 5  . metFORMIN (GLUCOPHAGE-XR) 500 MG 24 hr tablet TAKE 2 TABLETS BY MOUTH ONCE DAILY WITH BREAKFAST 180 tablet 0  . metoprolol succinate (TOPROL-XL) 25 MG 24 hr tablet Take as needed for afib. 30 tablet 0  . pantoprazole (PROTONIX) 40 MG tablet Take 40 mg by mouth daily.      No current facility-administered medications for this encounter.     BP 132/78   Pulse 64   Wt 92.6 kg (204 lb 3 oz)   SpO2 97%   BMI 28.48 kg/m  General: NAD Neck: No JVD, no thyromegaly or thyroid nodule.  Lungs: Clear to auscultation bilaterally with normal respiratory effort. CV: Nondisplaced PMI.  Heart regular S1/S2, no S3/S4, 2/6 early SEM RUSB.  No peripheral edema.  No carotid bruit.  Normal pedal pulses.  Abdomen: Soft, nontender, no hepatosplenomegaly, no distention.  Skin: Intact without lesions or rashes.  Neurologic: Alert and oriented x 3.  Psych: Normal affect. Extremities: No clubbing or cyanosis.  HEENT: Normal.   Assessment/Plan: 1. Bicuspid aortic valve disorder: Mild AS on 11/20 echo. Minimal dilation of ascending aorta on 6/18 MRA (3.8 cm).   - Repeat echo in 2 years.   2. CAD: Moderate nonobstructive disease on 2012 cath and again on cath in 10/19.   - Continue statin, lipids in 9/20 were ok.  - He is not on ASA as he is taking apixaban.   3. Hyperlipidemia: Good lipids in 9/20    4. HTN: Remains high.   - Continue amlodipine and benazepril.    - He did not tolerate HCTZ.  - He wants to try weight loss before increasing BP meds, he is going to aggressively work on diet/exercise.  - I suspect he has sleep apnea, treating with CPAP may significantly help his BP (see below).    5. Atrial fibrillation: Paroxysmal atrial fibrillation seen by [facility] monitor.  He is symptomatic when in atrial fibrillation, but episodes are short and infrequent.    - He has a Multimedia programmer.  - Continue apixaban.  - Atrial fibrillation is not frequent enough to take a rhythm control path at this  point.  - He wants to stop Toprol XL, thinks it makes him fatigued. I told him that he could keep the Toprol XL just for prn use (if he feels atrial fibrillation).  6. OSA: Strongly suspect OSA.  Treatment could help lower BP and also decrease risk for recurrent atrial fibrillation.  - I will arrange for home sleep study.   Followup 6 months.    [name] 04/27/2019

## 2019-04-27 NOTE — Progress Notes (Signed)
Patient Name: Samuel Willis          DOB: 1945-11-22      Height:  27ft 11in    Weight:204lbs  Office Name: [facility] failure clinic          Referring Provider: Dr.Dalton Richardson Dopp Date: 04/27/2019  Date:   STOP BANG RISK ASSESSMENT S (snore) Have you been told that you snore?     YES   T (tired) Are you often tired, fatigued, or sleepy during the day?   YES  O (obstruction) Do you stop breathing, choke, or gasp during sleep? NO   P (pressure) Do you have or are you being treated for high blood pressure? YES   B (BMI) Is your body index greater than 35 kg/m? NO   A (age) Are you 41 years old or older? YES   N (neck) Do you have a neck circumference greater than 16 inches?   NO   G (gender) Are you a male? YES   TOTAL STOP/BANG "YES" ANSWERS                                                                        For Office Use Only              Procedure Order Form    YES to 3+ Stop Bang questions OR two clinical symptoms - patient qualifies for [facility] (CPT 95800)     Submit: This Form + Patient Face Sheet + Clinical Note via [facility] or Fax: [phone]         Clinical Notes: Will consult Sleep Specialist and refer for management of therapy due to patient increased risk of Sleep Apnea. Ordering a sleep study due to the following two clinical symptoms: Excessive daytime sleepiness G47.10 / Gastroesophageal reflux K21.9 / Nocturia R35.1 / Morning Headaches G44.221 / Difficulty concentrating R41.840 / Memory problems or poor judgment G31.84 / Personality changes or irritability R45.4 / Loud snoring R06.83 / Depression F32.9 / Unrefreshed by sleep G47.8 / Impotence N52.9 / History of high blood pressure R03.0 / Insomnia G47.00    I understand that I am proceeding with a home sleep apnea test as ordered by my treating physician. I understand that untreated sleep apnea is a serious cardiovascular risk factor and it is my responsibility to perform the test and seek  management for sleep apnea. I will be contacted with the results and be managed for sleep apnea by a local sleep physician. I will be receiving equipment and further instructions from [facility]. I shall promptly ship back the equipment via the included mailing label. I understand my insurance will be billed for the test and as the patient I am responsible for any insurance related out-of-pocket costs incurred. I have been provided with written instructions and can call for additional video or telephonic instruction, with 24-hour availability of qualified personnel to answer any questions: Patient Help Desk [phone].  Patient Signature ______________________________________________________   Date______________________ Patient Telemedicine Verbal Consent

## 2019-04-27 NOTE — Patient Instructions (Signed)
Take metoprolol only as needed for afib.  Follow up in 6 months.  Your provider has recommended that you have a home sleep study.  [employer] is the company that does these test.  They will contact you by phone and must speak with you before they can ship the equipment.  Once they have spoken with you they will send the equipment right to your home with instructions on how to set it up.  Once you have completed the test simply box all the equipment back up and mail back to the company.  IF you have any questions or issues with the equipment please call the company directly at [phone].  If your test is positive for sleep apnea and you need a home CPAP machine you will be contacted by Dr Theodosia Blender office [place]) to set this up.

## 2019-04-28 ENCOUNTER — Telehealth (HOSPITAL_COMMUNITY): Payer: Self-pay | Admitting: *Deleted

## 2019-04-28 NOTE — Telephone Encounter (Signed)
;  office note, demographics, stop bang, and order faxed to [facility]. Confirmation received.

## 2019-05-01 ENCOUNTER — Telehealth (HOSPITAL_COMMUNITY): Payer: Self-pay

## 2019-05-01 NOTE — Telephone Encounter (Signed)
Faxed order and consult notes to [facility] for a sleep study. Faxed to [phone].

## 2019-05-03 ENCOUNTER — Telehealth (HOSPITAL_COMMUNITY): Payer: Self-pay

## 2019-05-03 NOTE — Telephone Encounter (Signed)
ReFaxed ov notes, stop bang, demographics and order to [facility] @ [phone].

## 2019-05-15 ENCOUNTER — Encounter (INDEPENDENT_AMBULATORY_CARE_PROVIDER_SITE_OTHER): Payer: Medicare Other | Admitting: Cardiology

## 2019-05-15 DIAGNOSIS — G4733 Obstructive sleep apnea (adult) (pediatric): Secondary | ICD-10-CM

## 2019-05-17 DIAGNOSIS — H43813 Vitreous degeneration, bilateral: Secondary | ICD-10-CM | POA: Diagnosis not present

## 2019-05-17 DIAGNOSIS — H2513 Age-related nuclear cataract, bilateral: Secondary | ICD-10-CM | POA: Diagnosis not present

## 2019-05-17 DIAGNOSIS — E119 Type 2 diabetes mellitus without complications: Secondary | ICD-10-CM | POA: Diagnosis not present

## 2019-05-17 DIAGNOSIS — H35373 Puckering of macula, bilateral: Secondary | ICD-10-CM | POA: Diagnosis not present

## 2019-06-19 NOTE — Procedures (Signed)
   Sleep Study Report  Name: Samuel Willis  I: [id number] Birth Date: [birth year]  Age: 74  Gender: Male Insurer: BMI: 28.7 (W=205 lb, H=5' 11'') Study Date: 05/15/2019 Referring Physician: [name], MD   Summary & Diagnosis TEST DESCRIPTION: Home sleep apnea testing was completed using the WatchPat, a Type 1 device, utilizing peripheral arterial tonometry (PAT), chest movement, actigraphy, pulse oximetry, pulse rate, body position and snore. AHI was calculated with apnea and hypopnea using valid sleep time as the denominator. RDI includes apneas, hypopneas, and RERAs. The data acquired and the scoring of sleep and all associated events were performed in accordance with the recommended standards and specifications as outlined in the AASM Manual for the Scoring of Sleep and Associated Events 2.2.0 (2015).  FINDINGS: 1. Severe Obstructive Sleep Apnea Syndrome with AHI 36.8/hr. 2. Minimal Central Sleep Apnea with pAHIc 2.9/hr. 3. Minimum O2 saturation was 83% with no criteria met for nocturnal hypoxemia. 4. Moderate snoring was noted. 5. Average heart rate was 54bpm. 6. Total sleep time was 8 hours and 49mn. 7. There were 15 awakenings. 8. Mildly increased sleep latency onset at 36 minutes. 9. Shortened REM sleep latency at 15 minutes.  DIAGNOSIS: Severe Obstructive Sleep Apnea (G47.33)  RECOMMENDATIONS: 1. Clinical correlation of these findings is necessary. The decision to treat obstructive sleep apnea (OSA) is usually based on the presence of apnea symptoms or the presence of associated medical conditions such as Hypertension, Congestive Heart Failure, Atrial Fibrillation or Obesity. The most common symptoms of OSA are snoring, gasping for breath while sleeping, daytime sleepiness and fatigue.  2. Initiating apnea therapy is recommended given the presence of symptoms and/or associated conditions. Recommend proceeding with one of the following:   a. Auto-CPAP therapy  with a pressure range of 5-20cm H2O.    b. An oral appliance (OA) that can be obtained from certain dentists with expertise in sleep medicine. These are primarily of use in non-obese patients with mild and moderate disease.   c. An ENT consultation which may be useful to look for specific causes of obstruction and possible treatment options.    d. If patient is intolerant to PAP therapy, consider referral to ENT for evaluation for hypoglossal nerve stimulator.   3. Close follow-up is necessary to ensure success with CPAP or oral appliance therapy for maximum benefit .   4. A follow-up oximetry study on CPAP is recommended to assess the adequacy of therapy and determine the need for supplemental oxygen or the potential need for Bi-level therapy. An arterial blood gas to determine the adequacy of baseline ventilation and oxygenation should also be considered.   5. Healthy sleep recommendations include: adequate nightly sleep (normal 7-9 hrs/night), avoidance of caffeine after noon and alcohol near bedtime, and maintaining a sleep environment that is cool, dark and quiet.   6. Weight loss for overweight patients is recommended. Even modest amounts of weight loss can significantly improve the severity of sleep apnea.  7. Snoring recommendations include: weight loss where appropriate, side sleeping, and avoidance of alcohol before Bed.   8. Operation of motor vehicle or dangerous equipment must be avoided when feeling drowsy, excessively sleepy, or mentally fatigued.   Report prepared by: Signature: TFransico Him MD ABSM Electronically Signed: Jun 19, 2019

## 2019-06-20 ENCOUNTER — Telehealth: Payer: Self-pay | Admitting: *Deleted

## 2019-06-20 NOTE — Telephone Encounter (Signed)
-----   Message from Sueanne Margarita, MD sent at 06/19/2019  9:01 PM EST ----- Please let patient know that they have sleep apnea and recommend auto CPAP titration through [facility].  Orders have been placed in Epic. Please set 10 week OV with me.

## 2019-06-20 NOTE — Telephone Encounter (Addendum)
Order placed to [facility] via electronic fax.

## 2019-06-22 ENCOUNTER — Telehealth (HOSPITAL_COMMUNITY): Payer: Self-pay

## 2019-06-22 DIAGNOSIS — H2512 Age-related nuclear cataract, left eye: Secondary | ICD-10-CM | POA: Diagnosis not present

## 2019-06-22 HISTORY — PX: OTHER SURGICAL HISTORY: SHX169

## 2019-06-22 NOTE — Telephone Encounter (Signed)
Received a fax from [employer] stating that they received the referral for [facility] and are currently processing the patient.

## 2019-06-23 ENCOUNTER — Telehealth: Payer: Self-pay | Admitting: *Deleted

## 2019-06-23 NOTE — Telephone Encounter (Signed)
Called voicemail full can't leave message.

## 2019-06-23 NOTE — Telephone Encounter (Signed)
-----   Message from Sueanne Margarita, MD sent at 06/19/2019  9:01 PM EST ----- Please let patient know that they have sleep apnea and recommend auto CPAP titration through [facility].  Orders have been placed in Epic. Please set 10 week OV with me.

## 2019-06-29 ENCOUNTER — Other Ambulatory Visit: Payer: Self-pay

## 2019-06-30 ENCOUNTER — Encounter: Payer: Self-pay | Admitting: Family Medicine

## 2019-06-30 ENCOUNTER — Ambulatory Visit (INDEPENDENT_AMBULATORY_CARE_PROVIDER_SITE_OTHER): Payer: Medicare Other | Admitting: Family Medicine

## 2019-06-30 VITALS — BP 122/74 | HR 74 | Temp 97.6°F | Ht 71.0 in | Wt 200.0 lb

## 2019-06-30 DIAGNOSIS — R310 Gross hematuria: Secondary | ICD-10-CM

## 2019-06-30 DIAGNOSIS — N486 Induration penis plastica: Secondary | ICD-10-CM | POA: Diagnosis not present

## 2019-06-30 DIAGNOSIS — Z7901 Long term (current) use of anticoagulants: Secondary | ICD-10-CM | POA: Diagnosis not present

## 2019-06-30 DIAGNOSIS — R319 Hematuria, unspecified: Secondary | ICD-10-CM | POA: Diagnosis not present

## 2019-06-30 LAB — BASIC METABOLIC PANEL
BUN: 19 mg/dL (ref 6–23)
CO2: 26 mEq/L (ref 19–32)
Calcium: 9.6 mg/dL (ref 8.4–10.5)
Chloride: 105 mEq/L (ref 96–112)
Creatinine, Ser: 1.1 mg/dL (ref 0.40–1.50)
GFR: 65.64 mL/min (ref >60.00)
Glucose, Bld: 112 mg/dL — ABNORMAL HIGH (ref 70–99)
Potassium: 4.7 mEq/L (ref 3.5–5.1)
Sodium: 140 mEq/L (ref 135–145)

## 2019-06-30 LAB — CBC WITH DIFFERENTIAL/PLATELET
Basophils Absolute: 0 10*3/uL (ref 0.0–0.1)
Basophils Relative: 0.5 % (ref 0.0–3.0)
Eosinophils Absolute: 0.1 10*3/uL (ref 0.0–0.7)
Eosinophils Relative: 3 % (ref 0.0–5.0)
HCT: 43.9 % (ref 39.0–52.0)
Hemoglobin: 14.7 g/dL (ref 13.0–17.0)
Lymphocytes Relative: 27.8 % (ref 12.0–46.0)
Lymphs Abs: 1.1 10*3/uL (ref 0.7–4.0)
MCHC: 33.5 g/dL (ref 30.0–36.0)
MCV: 94.5 fl (ref 78.0–100.0)
Monocytes Absolute: 0.4 10*3/uL (ref 0.1–1.0)
Monocytes Relative: 9.4 % (ref 3.0–12.0)
Neutro Abs: 2.3 10*3/uL (ref 1.4–7.7)
Neutrophils Relative %: 59.3 % (ref 43.0–77.0)
Platelets: 171 10*3/uL (ref 150.0–400.0)
RBC: 4.64 Mil/uL (ref 4.22–5.81)
RDW: 13.3 % (ref 11.5–15.5)
WBC: 3.8 10*3/uL — ABNORMAL LOW (ref 4.0–10.5)

## 2019-06-30 LAB — POCT URINALYSIS DIPSTICK
Bilirubin, UA: NEGATIVE
Blood, UA: POSITIVE
Glucose, UA: NEGATIVE
Ketones, UA: NEGATIVE
Leukocytes, UA: NEGATIVE
Nitrite, UA: NEGATIVE
Protein, UA: NEGATIVE
Spec Grav, UA: 1.015 (ref 1.010–1.025)
Urobilinogen, UA: 0.2 E.U./dL
pH, UA: 6 (ref 5.0–8.0)

## 2019-06-30 LAB — URINALYSIS, ROUTINE W REFLEX MICROSCOPIC
Bilirubin Urine: NEGATIVE
Ketones, ur: NEGATIVE
Leukocytes,Ua: NEGATIVE
Nitrite: NEGATIVE
Specific Gravity, Urine: 1.015 (ref 1.000–1.030)
Total Protein, Urine: NEGATIVE
Urine Glucose: NEGATIVE
Urobilinogen, UA: 0.2 (ref 0.0–1.0)
WBC, UA: NONE SEEN (ref 0–?)
pH: 6.5 (ref 5.0–8.0)

## 2019-06-30 NOTE — Progress Notes (Signed)
Subjective  CC:  Chief Complaint  Patient presents with  . Hematuria   Same day acute visit; PCP not available. New pt to me. Chart reviewed.   HPI: Samuel Willis is a 74 y.o. male who presents to the office today to address the problems listed above in the chief complaint.  Yesterday am had pink gross hematuria in am and then again while golfing had red gross hematuria with "thick sediment". Nonpainful.  This am his urine appears clear. He had intermittent chronic low back pain that "resolved" yesterday after hitting a few balls. He is not sure if this is related. He denies recent increase in activity or trauma. No dysuria, f/c/s, malaise or low back pain. No h/o uti or prostatitis; he has bph. He is on chronic eloquis. No prior h/o hematuria  Peyronie's syndrome: would like to see urologist to discuss possible treatment options.   Assessment  1. Gross hematuria   2. Current use of long term anticoagulation   3. Peyronie's disease      Plan   hematuria:  Improving ; now with microscopic hematuria by urinalysis. Possibly related to eloquis. Rec holding x 3 days and monitor. Check blood work and urine culture. If persists or recurs will need urological eval.   Peyronie's: refer to urology per pt request.   Follow up: No follow-ups on file.  08/22/2019  Orders Placed This Encounter  Procedures  . Urine Culture  . UA, micro  . CBC w/Diff  . BMP  . Ambulatory referral to Urology  . POCT Urinalysis Dipstick   No orders of the defined types were placed in this encounter.     I reviewed the patients updated PMH, FH, and SocHx.    Patient Active Problem List   Diagnosis Date Noted  . Peyronie's disease 06/30/2019  . Current use of long term anticoagulation 06/30/2019  . Bilateral impacted cerumen 12/21/2018  . Sensorineural hearing loss (SNHL), bilateral, followed by Dr. Janace Hoard 04/06/2018  . Eustachian tube dysfunction, bilateral -- R TM with tympanostamy tube 04/05/2018    . MCI (mild cognitive impairment) 01/09/2016  . Bicuspid aortic valve   . Aortic insufficiency   . Aortic stenosis   . Ascending aortic aneurysm ([place])   . Paroxysmal atrial fibrillation ([place]) 10/31/2015  . Type 2 diabetes mellitus with other specified complication ([place]), controlled with Metformin 06/26/2013  . Asbestos exposure 06/26/2013  . CAD (coronary artery disease) 07/26/2011  . Barrett's esophagus, last EGD 01/2018, 3 cm, no dysplasia, repeat 3-5 years 02/23/2011  . GERD (gastroesophageal reflux disease) 01/06/2011  . Esophageal dysphagia 11/30/2010  . 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 Hyperlipidemia, controlled with Lipitor, LDL goal < 70 07/16/2008  . Hypertension associated with diabetes ([place]) 07/16/2008   Current Meds  Medication Sig  . amLODipine (NORVASC) 10 MG tablet Take 1 tablet (10 mg total) by mouth daily.  Marland Kitchen apixaban (ELIQUIS) 5 MG TABS tablet Take 1 tablet (5 mg total) by mouth 2 (two) times daily.  Marland Kitchen atorvastatin (LIPITOR) 40 MG tablet Take 1 tablet by mouth once daily  . benazepril (LOTENSIN) 40 MG tablet Take 1 tablet (40 mg total) by mouth daily.  . pantoprazole (PROTONIX) 40 MG tablet Take 40 mg by mouth daily.     Allergies: Patient has No Known Allergies. Family History: Patient family history includes Arthritis in his mother; Asthma in his father; Diabetes in his mother; Emphysema in his father; Heart attack (age of onset: 44) in his father;  Heart disease in his father; Hypertension in his brother, father, and sister; Stroke in his mother. Social History:  Patient  reports that he quit smoking about 25 years ago. His smoking use included cigarettes. He has a 5.00 pack-year smoking history. He has never used smokeless tobacco. He reports that he does not drink alcohol or use drugs.  Review of Systems: Constitutional: Negative for fever malaise or anorexia Cardiovascular: negative for chest pain Respiratory: negative for SOB or persistent  cough Gastrointestinal: negative for abdominal pain  Objective  Vitals: BP 122/74 (BP Location: Left Arm, Patient Position: Sitting, Cuff Size: Large)   Pulse 74   Temp 97.6 F (36.4 C) (Temporal)   Ht 5\' 11"  (1.803 m)   Wt 200 lb (90.7 kg)   SpO2 95%   BMI 27.89 kg/m  General: no acute distress , A&Ox3, appears well.  HEENT: PEERL, conjunctiva normal, Cardiovascular:  RRR without murmur or gallop.  Respiratory:  Good breath sounds bilaterally, CTAB with normal respiratory effort Abdomen:no suprapubic ttp, soft, no cvat.  Skin:  Warm, no rashes  Office Visit on 06/30/2019  Component Date Value Ref Range Status  . Color, UA 06/30/2019 Yellow   Final  . Clarity, UA 06/30/2019 Cloudy   Final  . Glucose, UA 06/30/2019 Negative  Negative Final  . Bilirubin, UA 06/30/2019 Negative   Final  . Ketones, UA 06/30/2019 Negative   Final  . Spec Grav, UA 06/30/2019 1.015  1.010 - 1.025 Final  . Blood, UA 06/30/2019 Positive   Final  . pH, UA 06/30/2019 6.0  5.0 - 8.0 Final  . Protein, UA 06/30/2019 Negative  Negative Final  . Urobilinogen, UA 06/30/2019 0.2  0.2 or 1.0 E.U./dL Final  . Nitrite, UA 06/30/2019 Negative   Final  . Leukocytes, UA 06/30/2019 Negative  Negative Final  . Color, Urine 06/30/2019 YELLOW  Yellow;Lt. Yellow;Straw;Dark Yellow;Amber;Green;Red;Brown Final  . APPearance 06/30/2019 CLEAR  Clear;Turbid;Slightly Cloudy;Cloudy Final  . Specific Gravity, Urine 06/30/2019 1.015  1.000 - 1.030 Final  . pH 06/30/2019 6.5  5.0 - 8.0 Final  . Total Protein, Urine 06/30/2019 NEGATIVE  Negative Final  . Urine Glucose 06/30/2019 NEGATIVE  Negative Final  . Ketones, ur 06/30/2019 NEGATIVE  Negative Final  . Bilirubin Urine 06/30/2019 NEGATIVE  Negative Final  . Hgb urine dipstick 06/30/2019 LARGE* Negative Final  . Urobilinogen, UA 06/30/2019 0.2  0.0 - 1.0 Final  . Leukocytes,Ua 06/30/2019 NEGATIVE  Negative Final  . Nitrite 06/30/2019 NEGATIVE  Negative Final  . WBC, UA  06/30/2019 none seen  0-2/hpf Final  . RBC / HPF 06/30/2019 11-20/hpf* 0-2/hpf Final  . Squamous Epithelial / LPF 06/30/2019 Rare(0-4/hpf)  Rare(0-4/hpf) Final  . WBC 06/30/2019 3.8* 4.0 - 10.5 K/uL Final  . RBC 06/30/2019 4.64  4.22 - 5.81 Mil/uL Final  . Hemoglobin 06/30/2019 14.7  13.0 - 17.0 g/dL Final  . HCT 06/30/2019 43.9  39.0 - 52.0 % Final  . MCV 06/30/2019 94.5  78.0 - 100.0 fl Final  . MCHC 06/30/2019 33.5  30.0 - 36.0 g/dL Final  . RDW 06/30/2019 13.3  11.5 - 15.5 % Final  . Platelets 06/30/2019 171.0  150.0 - 400.0 K/uL Final  . Neutrophils Relative % 06/30/2019 59.3  43.0 - 77.0 % Final  . Lymphocytes Relative 06/30/2019 27.8  12.0 - 46.0 % Final  . Monocytes Relative 06/30/2019 9.4  3.0 - 12.0 % Final  . Eosinophils Relative 06/30/2019 3.0  0.0 - 5.0 % Final  . Basophils Relative 06/30/2019 0.5  0.0 - 3.0 % Final  . Neutro Abs 06/30/2019 2.3  1.4 - 7.7 K/uL Final  . Lymphs Abs 06/30/2019 1.1  0.7 - 4.0 K/uL Final  . Monocytes Absolute 06/30/2019 0.4  0.1 - 1.0 K/uL Final  . Eosinophils Absolute 06/30/2019 0.1  0.0 - 0.7 K/uL Final  . Basophils Absolute 06/30/2019 0.0  0.0 - 0.1 K/uL Final  . Sodium 06/30/2019 140  135 - 145 mEq/L Final  . Potassium 06/30/2019 4.7  3.5 - 5.1 mEq/L Final  . Chloride 06/30/2019 105  96 - 112 mEq/L Final  . CO2 06/30/2019 26  19 - 32 mEq/L Final  . Glucose, Bld 06/30/2019 112* 70 - 99 mg/dL Final  . BUN 06/30/2019 19  6 - 23 mg/dL Final  . Creatinine, Ser 06/30/2019 1.10  0.40 - 1.50 mg/dL Final  . GFR 06/30/2019 65.64  >60.00 mL/min Final  . Calcium 06/30/2019 9.6  8.4 - 10.5 mg/dL Final       Commons side effects, risks, benefits, and alternatives for medications and treatment plan prescribed today were discussed, and the patient expressed understanding of the given instructions. Patient is instructed to call or message via MyChart if he/she has any questions or concerns regarding our treatment plan. No barriers to understanding were  identified. We discussed Red Flag symptoms and signs in detail. Patient expressed understanding regarding what to do in case of urgent or emergency type symptoms.   Medication list was reconciled, printed and provided to the patient in AVS. Patient instructions and summary information was reviewed with the patient as documented in the AVS. This note was prepared with assistance of Dragon voice recognition software. Occasional wrong-word or sound-a-like substitutions may have occurred due to the inherent limitations of voice recognition software  This visit occurred during the SARS-CoV-2 public health emergency.  Safety protocols were in place, including screening questions prior to the visit, additional usage of staff PPE, and extensive cleaning of exam room while observing appropriate contact time as indicated for disinfecting solutions.

## 2019-06-30 NOTE — Patient Instructions (Signed)
Please follow up as scheduled for your next visit with me: Visit date not found   We will let you know your test results. For now, monitor your urine to see if the blood clears.  Please hold your eloquis for the next 3 days, then resume.   If you have any questions or concerns, please don't hesitate to send me a message via MyChart or call the office at [phone]. Thank you for visiting with [place] today! It's our pleasure caring for you.   Hematuria, Adult Hematuria is blood in the urine. Blood may be visible in the urine, or it may be identified with a test. This condition can be caused by infections of the bladder, urethra, kidney, or prostate. Other possible causes include:  Kidney stones.  Cancer of the urinary tract.  Too much calcium in the urine.  Conditions that are passed from parent to child (inherited conditions).  Exercise that requires a lot of energy. Infections can usually be treated with medicine, and a kidney stone usually will pass through your urine. If neither of these is the cause of your hematuria, more tests may be needed to identify the cause of your symptoms. It is very important to tell your health care provider about any blood in your urine, even if it is painless or the blood stops without treatment. Blood in the urine, when it happens and then stops and then happens again, can be a symptom of a very serious condition, including cancer. There is no pain in the initial stages of many urinary cancers. Follow these instructions at home: Medicines  Take over-the-counter and prescription medicines only as told by your health care provider.  If you were prescribed an antibiotic medicine, take it as told by your health care provider. Do not stop taking the antibiotic even if you start to feel better. Eating and drinking  Drink enough fluid to keep your urine clear or pale yellow. It is recommended that you drink 3-4 quarts (2.8-3.8 L) a day. If you have been  diagnosed with an infection, it is recommended that you drink cranberry juice in addition to large amounts of water.  Avoid caffeine, tea, and carbonated beverages. These tend to irritate the bladder.  Avoid alcohol because it may irritate the prostate (men). General instructions  If you have been diagnosed with a kidney stone, follow your health care provider's instructions about straining your urine to catch the stone.  Empty your bladder often. Avoid holding urine for long periods of time.  If you are male: ? After a bowel movement, wipe from front to back and use each piece of toilet paper only once. ? Empty your bladder before and after sex.  Pay attention to any changes in your symptoms. Tell your health care provider about any changes or any new symptoms.  It is your responsibility to get your test results. Ask your health care provider, or the department performing the test, when your results will be ready.  Keep all follow-up visits as told by your health care provider. This is important. Contact a health care provider if:  You develop back pain.  You have a fever.  You have nausea or vomiting.  Your symptoms do not improve after 3 days.  Your symptoms get worse. Get help right away if:  You develop severe vomiting and are unable take medicine without vomiting.  You develop severe pain in your back or abdomen even though you are taking medicine.  You pass a large amount  of blood in your urine.  You pass blood clots in your urine.  You feel very weak or like you might faint.  You faint. Summary  Hematuria is blood in the urine. It has many possible causes.  It is very important that you tell your health care provider about any blood in your urine, even if it is painless or the blood stops without treatment.  Take over-the-counter and prescription medicines only as told by your health care provider.  Drink enough fluid to keep your urine clear or pale  yellow. This information is not intended to replace advice given to you by your health care provider. Make sure you discuss any questions you have with your health care provider. Document Revised: 10/26/2018 Document Reviewed: 07/04/2016 Elsevier Patient Education  2020 [employer].

## 2019-07-01 LAB — URINE CULTURE
MICRO NUMBER:: 10047111
Result:: NO GROWTH
SPECIMEN QUALITY:: ADEQUATE

## 2019-07-03 DIAGNOSIS — H2511 Age-related nuclear cataract, right eye: Secondary | ICD-10-CM | POA: Diagnosis not present

## 2019-07-04 NOTE — Telephone Encounter (Signed)
Called results LMTCB 

## 2019-07-06 DIAGNOSIS — H2511 Age-related nuclear cataract, right eye: Secondary | ICD-10-CM | POA: Diagnosis not present

## 2019-07-06 NOTE — Telephone Encounter (Signed)
Called results LMTCB 

## 2019-07-06 NOTE — Telephone Encounter (Signed)
Informed patient of sleep study results and patient understanding was verbalized. Patient understands his sleep study showed they have sleep apnea and recommend auto CPAP titration through [facility]. Orders have been placed in Epic. Please set 10 week OV with me.   Patient wants to be referred to ENT doctor before he agrees to the titration or a cpap. He does not think he has OSA because he has a blockage in his right nostril.

## 2019-07-06 NOTE — Telephone Encounter (Signed)
Refer to Dr. Wilburn Cornelia with ENT

## 2019-07-10 ENCOUNTER — Other Ambulatory Visit: Payer: Self-pay

## 2019-07-10 DIAGNOSIS — R0683 Snoring: Secondary | ICD-10-CM

## 2019-07-10 NOTE — Telephone Encounter (Signed)
Referral sent to the [facility] pool at [address].

## 2019-07-18 ENCOUNTER — Other Ambulatory Visit: Payer: Self-pay | Admitting: Family Medicine

## 2019-07-21 DIAGNOSIS — R3912 Poor urinary stream: Secondary | ICD-10-CM | POA: Diagnosis not present

## 2019-07-21 DIAGNOSIS — R310 Gross hematuria: Secondary | ICD-10-CM | POA: Diagnosis not present

## 2019-07-21 DIAGNOSIS — Z125 Encounter for screening for malignant neoplasm of prostate: Secondary | ICD-10-CM | POA: Diagnosis not present

## 2019-07-21 DIAGNOSIS — N401 Enlarged prostate with lower urinary tract symptoms: Secondary | ICD-10-CM | POA: Diagnosis not present

## 2019-07-21 LAB — PSA: PSA: 3.5

## 2019-08-01 DIAGNOSIS — R310 Gross hematuria: Secondary | ICD-10-CM | POA: Diagnosis not present

## 2019-08-01 DIAGNOSIS — N132 Hydronephrosis with renal and ureteral calculous obstruction: Secondary | ICD-10-CM | POA: Diagnosis not present

## 2019-08-14 ENCOUNTER — Other Ambulatory Visit (HOSPITAL_COMMUNITY): Payer: Self-pay | Admitting: Nurse Practitioner

## 2019-08-14 ENCOUNTER — Other Ambulatory Visit: Payer: Self-pay | Admitting: Family Medicine

## 2019-08-14 DIAGNOSIS — L570 Actinic keratosis: Secondary | ICD-10-CM | POA: Diagnosis not present

## 2019-08-14 DIAGNOSIS — L821 Other seborrheic keratosis: Secondary | ICD-10-CM | POA: Diagnosis not present

## 2019-08-14 DIAGNOSIS — D2271 Melanocytic nevi of right lower limb, including hip: Secondary | ICD-10-CM | POA: Diagnosis not present

## 2019-08-14 DIAGNOSIS — Z85828 Personal history of other malignant neoplasm of skin: Secondary | ICD-10-CM | POA: Diagnosis not present

## 2019-08-14 DIAGNOSIS — D2272 Melanocytic nevi of left lower limb, including hip: Secondary | ICD-10-CM | POA: Diagnosis not present

## 2019-08-15 ENCOUNTER — Telehealth: Payer: Self-pay

## 2019-08-15 DIAGNOSIS — R310 Gross hematuria: Secondary | ICD-10-CM | POA: Diagnosis not present

## 2019-08-15 DIAGNOSIS — N132 Hydronephrosis with renal and ureteral calculous obstruction: Secondary | ICD-10-CM | POA: Diagnosis not present

## 2019-08-15 DIAGNOSIS — N201 Calculus of ureter: Secondary | ICD-10-CM | POA: Diagnosis not present

## 2019-08-15 NOTE — Telephone Encounter (Signed)
Patient calling to see if he could come in before his TOC appt [phone] to have certain labs done. Patient would like PSA and A1C and some other labs. Patient would like to speak with nurse regarding other labs

## 2019-08-16 ENCOUNTER — Telehealth: Payer: Self-pay

## 2019-08-16 NOTE — Telephone Encounter (Signed)
Patient TOC appt 10/31/19

## 2019-08-16 NOTE — Telephone Encounter (Signed)
Pt seen Dr. Jonni Sanger once for an acute visit, but not a true TOC visit.   Will also forward message to Dr. Jonni Sanger.

## 2019-08-16 NOTE — Telephone Encounter (Signed)
This encounter was created in error - please disregard.

## 2019-08-16 NOTE — Telephone Encounter (Signed)
Noted  

## 2019-08-16 NOTE — Telephone Encounter (Signed)
Can someone please verify if this is Dr. Jonni Sanger patient or not. Unsure if the PCP was made in error. Patient was seeing Dr.Wallace before she left. Patient would like to have his psa and a1c level checked.

## 2019-08-16 NOTE — Telephone Encounter (Signed)
Patient is aware that he can not have labs done before TOC appt

## 2019-08-16 NOTE — Telephone Encounter (Signed)
I recall he was going to transfer to Dr. Jerline Pain; looks like that [facility] visit was canceled? He is not my established patient.  He would need a TOC appt - I believe with Dr.Parker would make sense.  We don't make appointments for requested lab work. He may be due for cpe etc but needs toc appt first. Please change his scheduled visit with me accordingly.   This would not be seen as an acute visit.  Thanks. Let me know if you have questions.

## 2019-08-16 NOTE — Telephone Encounter (Signed)
Please advise 

## 2019-08-17 ENCOUNTER — Encounter: Payer: Self-pay | Admitting: Family Medicine

## 2019-08-17 ENCOUNTER — Other Ambulatory Visit: Payer: Self-pay | Admitting: Urology

## 2019-08-18 ENCOUNTER — Encounter (HOSPITAL_COMMUNITY): Payer: Self-pay

## 2019-08-18 NOTE — Patient Instructions (Addendum)
DUE TO COVID-19 ONLY ONE VISITOR IS ALLOWED TO COME WITH YOU AND STAY IN THE WAITING ROOM ONLY DURING PRE OP AND PROCEDURE. THE ONE VISITOR MAY VISIT WITH YOU IN YOUR PRIVATE ROOM DURING VISITING HOURS ONLY!!   COVID SWAB TESTING MUST BE COMPLETED ON:  Thursday, August 24, 2019 at 8:50 AM  [address] [place] [facility] enter pre surgical testing line (Must self quarantine after testing. Follow instructions on handout.)             Your procedure is scheduled on: Monday, August 28, 2019   Report to [facility] Main  Entrance    Report to admitting at 7:00 AM   Call this number if you have problems the morning of surgery [phone]   Bring CPAP mask and tubing day of surgery   Do not eat food or drink liquids :After Midnight.   Oral Hygiene is also important to reduce your risk of infection.                                    Remember - BRUSH YOUR TEETH THE MORNING OF SURGERY WITH YOUR REGULAR TOOTHPASTE   Do NOT smoke after Midnight   Take these medicines the morning of surgery with A SIP OF WATER: Atorvastatin, Esomeprazole  DO NOT TAKE ANY DIABETIC MEDICATIONS DAY OF YOUR SURGERY                               You may not have any metal on your body including jewelry, and body piercings             Do not wear lotions, powders, perfumes/cologne, or deodorant                          Men may shave face and neck.   Do not bring valuables to the hospital. [place].   Contacts, dentures or bridgework may not be worn into surgery.   Patients discharged the day of surgery will not be allowed to drive home.   Special Instructions: Bring a copy of your healthcare power of attorney and living will documents         the day of surgery if you haven't scanned them in before.              Please read over the following fact sheets you were given:  [facility] - Preparing for Surgery Before  surgery, you can play an important role.  Because skin is not sterile, your skin needs to be as free of germs as possible.  You can reduce the number of germs on your skin by washing with CHG (chlorahexidine gluconate) soap before surgery.  CHG is an antiseptic cleaner which kills germs and bonds with the skin to continue killing germs even after washing. Please DO NOT use if you have an allergy to CHG or antibacterial soaps.  If your skin becomes reddened/irritated stop using the CHG and inform your nurse when you arrive at Short Stay. Do not shave (including legs and underarms) for at least 48 hours prior to the first CHG shower.  You may shave your face/neck.  Please follow these instructions carefully:  1.  Shower with CHG Soap the night before surgery and the  morning of surgery.  2.  If you choose to wash your hair, wash your hair first as usual with your normal  shampoo.  3.  After you shampoo, rinse your hair and body thoroughly to remove the shampoo.                             4.  Use CHG as you would any other liquid soap.  You can apply chg directly to the skin and wash.  Gently with a scrungie or clean washcloth.  5.  Apply the CHG Soap to your body ONLY FROM THE NECK DOWN.   Do   not use on face/ open                           Wound or open sores. Avoid contact with eyes, ears mouth and   genitals (private parts).                       Wash face,  Genitals (private parts) with your normal soap.             6.  Wash thoroughly, paying special attention to the area where your    surgery  will be performed.  7.  Thoroughly rinse your body with warm water from the neck down.  8.  DO NOT shower/wash with your normal soap after using and rinsing off the CHG Soap.                9.  Pat yourself dry with a clean towel.            10.  Wear clean pajamas.            11.  Place clean sheets on your bed the night of your first shower and do not  sleep with pets. Day of Surgery : Do not apply  any lotions/deodorants the morning of surgery.  Please wear clean clothes to the hospital/surgery center.  FAILURE TO FOLLOW THESE INSTRUCTIONS MAY RESULT IN THE CANCELLATION OF YOUR SURGERY  PATIENT SIGNATURE_________________________________  NURSE SIGNATURE__________________________________  ________________________________________________________________________

## 2019-08-21 ENCOUNTER — Encounter (HOSPITAL_COMMUNITY): Payer: Self-pay

## 2019-08-21 ENCOUNTER — Encounter (HOSPITAL_COMMUNITY)
Admission: RE | Admit: 2019-08-21 | Discharge: 2019-08-21 | Disposition: A | Discharge status: Home / Self Care | Payer: Medicare Other | Source: Ambulatory Visit | Attending: Urology | Admitting: Urology

## 2019-08-21 ENCOUNTER — Ambulatory Visit: Payer: Medicare Other | Admitting: Family Medicine

## 2019-08-21 ENCOUNTER — Other Ambulatory Visit: Payer: Self-pay

## 2019-08-21 DIAGNOSIS — K219 Gastro-esophageal reflux disease without esophagitis: Secondary | ICD-10-CM | POA: Diagnosis not present

## 2019-08-21 DIAGNOSIS — E785 Hyperlipidemia, unspecified: Secondary | ICD-10-CM | POA: Diagnosis not present

## 2019-08-21 DIAGNOSIS — Z87891 Personal history of nicotine dependence: Secondary | ICD-10-CM | POA: Diagnosis not present

## 2019-08-21 DIAGNOSIS — I480 Paroxysmal atrial fibrillation: Secondary | ICD-10-CM | POA: Insufficient documentation

## 2019-08-21 DIAGNOSIS — R7303 Prediabetes: Secondary | ICD-10-CM | POA: Insufficient documentation

## 2019-08-21 DIAGNOSIS — I10 Essential (primary) hypertension: Secondary | ICD-10-CM | POA: Diagnosis not present

## 2019-08-21 DIAGNOSIS — Z79899 Other long term (current) drug therapy: Secondary | ICD-10-CM | POA: Insufficient documentation

## 2019-08-21 DIAGNOSIS — Z7984 Long term (current) use of oral hypoglycemic drugs: Secondary | ICD-10-CM | POA: Insufficient documentation

## 2019-08-21 DIAGNOSIS — N211 Calculus in urethra: Secondary | ICD-10-CM | POA: Diagnosis not present

## 2019-08-21 DIAGNOSIS — Z7901 Long term (current) use of anticoagulants: Secondary | ICD-10-CM | POA: Insufficient documentation

## 2019-08-21 DIAGNOSIS — I2510 Atherosclerotic heart disease of native coronary artery without angina pectoris: Secondary | ICD-10-CM | POA: Insufficient documentation

## 2019-08-21 DIAGNOSIS — Z01812 Encounter for preprocedural laboratory examination: Secondary | ICD-10-CM | POA: Diagnosis not present

## 2019-08-21 HISTORY — DX: Prediabetes: R73.03

## 2019-08-21 HISTORY — DX: Unspecified hearing loss, unspecified ear: H91.90

## 2019-08-21 HISTORY — DX: Induration penis plastica: N48.6

## 2019-08-21 HISTORY — DX: Unspecified eustachian tube disorder, unspecified ear: H69.90

## 2019-08-21 HISTORY — DX: Hematuria, unspecified: R31.9

## 2019-08-21 HISTORY — DX: Nonrheumatic aortic (valve) stenosis: I35.0

## 2019-08-21 HISTORY — DX: Cardiomegaly: I51.7

## 2019-08-21 HISTORY — DX: Obstructive sleep apnea (adult) (pediatric): G47.33

## 2019-08-21 HISTORY — DX: Other specified disorders of Eustachian tube, unspecified ear: H69.80

## 2019-08-21 HISTORY — DX: Nonrheumatic aortic (valve) insufficiency: I35.1

## 2019-08-21 NOTE — Progress Notes (Signed)
PCP - Dr. Cathlean Marseilles last office visit 06/30/19 in epic Cardiologist - Dr. Einar Crow Last office visit 04/27/19 in epic  Chest x-ray - greater than 1year EKG - 04/27/19 in epic Stress Test - greater than 2 year ECHO - 04/24/19 in epic Cardiac Cath - 04/13/2018 in epic  Sleep Study - 05/15/19 CPAP - does not use a CPAP  Fasting Blood Sugar - does not check at home Checks Blood Sugar _N/A____ times a day  Blood Thinner Instructions: Eliquis last dose 08/25/19 Aspirin Instructions: N/A Last Dose:N/A  Anesthesia review: PAF, Watchman, Loop recorder in place battery dead,HTN, CAD, pre diabetes, thoracic aortic aneursym 3.8 cm 11/18/2016  Patient denies shortness of breath, fever, cough and chest pain at PAT appointment   Patient verbalized understanding of instructions that were given to them at the PAT appointment. Patient was also instructed that they will need to review over the PAT instructions again at home before surgery.

## 2019-08-22 ENCOUNTER — Telehealth (HOSPITAL_COMMUNITY): Payer: Self-pay

## 2019-08-22 ENCOUNTER — Encounter: Payer: Medicare Other | Admitting: Family Medicine

## 2019-08-22 ENCOUNTER — Encounter (HOSPITAL_COMMUNITY)
Admission: RE | Admit: 2019-08-22 | Discharge: 2019-08-22 | Disposition: A | Discharge status: Home / Self Care | Payer: Medicare Other | Source: Ambulatory Visit | Attending: Urology | Admitting: Urology

## 2019-08-22 DIAGNOSIS — I10 Essential (primary) hypertension: Secondary | ICD-10-CM | POA: Diagnosis not present

## 2019-08-22 DIAGNOSIS — K219 Gastro-esophageal reflux disease without esophagitis: Secondary | ICD-10-CM | POA: Diagnosis not present

## 2019-08-22 DIAGNOSIS — Z01812 Encounter for preprocedural laboratory examination: Secondary | ICD-10-CM | POA: Diagnosis not present

## 2019-08-22 DIAGNOSIS — I2510 Atherosclerotic heart disease of native coronary artery without angina pectoris: Secondary | ICD-10-CM | POA: Diagnosis not present

## 2019-08-22 DIAGNOSIS — N211 Calculus in urethra: Secondary | ICD-10-CM | POA: Diagnosis not present

## 2019-08-22 DIAGNOSIS — R7303 Prediabetes: Secondary | ICD-10-CM | POA: Diagnosis not present

## 2019-08-22 LAB — BASIC METABOLIC PANEL
Anion gap: 8 (ref 5–15)
BUN: 20 mg/dL (ref 8–23)
CO2: 24 mmol/L (ref 22–32)
Calcium: 9.6 mg/dL (ref 8.9–10.3)
Chloride: 111 mmol/L (ref 98–111)
Creatinine, Ser: 1.02 mg/dL (ref 0.61–1.24)
GFR calc Af Amer: >60 mL/min (ref >60)
GFR calc non Af Amer: >60 mL/min (ref >60)
Glucose, Bld: 117 mg/dL — ABNORMAL HIGH (ref 70–99)
Potassium: 4.5 mmol/L (ref 3.5–5.1)
Sodium: 143 mmol/L (ref 135–145)

## 2019-08-22 LAB — CBC
HCT: 42.4 % (ref 39.0–52.0)
Hemoglobin: 14.3 g/dL (ref 13.0–17.0)
MCH: 32.4 pg (ref 26.0–34.0)
MCHC: 33.7 g/dL (ref 30.0–36.0)
MCV: 95.9 fL (ref 80.0–100.0)
Platelets: 174 K/uL (ref 150–400)
RBC: 4.42 MIL/uL (ref 4.22–5.81)
RDW: 12.6 % (ref 11.5–15.5)
WBC: 4.1 K/uL (ref 4.0–10.5)
nRBC: 0 % (ref 0.0–0.2)

## 2019-08-22 LAB — HEMOGLOBIN A1C
Hgb A1c MFr Bld: 5.9 % — ABNORMAL HIGH (ref 4.8–5.6)
Mean Plasma Glucose: 122.63 mg/dL

## 2019-08-22 NOTE — Telephone Encounter (Signed)
Clearance completed by Dr [name]. Faxed back to [facility] with confirmation of receipt.

## 2019-08-23 NOTE — Progress Notes (Signed)
Anesthesia Chart Review   Case: [id number] Date/Time: 08/28/19 0845   Procedure: CYSTOSCOPY/URETEROSCOPY/HOLMIUM LASER/STENT PLACEMENT (Left )   Anesthesia type: General   Pre-op diagnosis: LEFT URETERAL STONE   Location: [facility] / [facility]   Surgeons: [name], MD      DISCUSSION:74 y.o. former smoker (5 pack years, quit 11/03/93) with h/o GERD, HLD, HTN, PAF (on Eliquis), moderate nonobstructive CAD, mild AS/mild AI (valve area 2.00 cm2, mean gradient 14.0 mmHg, peak gradient 26.7 mmHg), renal insufficiency, OSA, pre-diabetes, left ureteral stone scheduled for above procedure 08/28/19 with Dr. Link Snuffer.    Pt last seen by cardiologist, Dr. [name], 04/27/2019.  Stable at this visit with 6 month follow up recommended.    Clearance from cardiology on chart.    Anticipate pt can proceed with planned procedure barring acute status change.   VS: BP 125/78   Pulse (!) 54   Temp 36.8 C (Oral)   Resp 16   Ht 5\' 11"  (1.803 m)   Wt 84.8 kg   SpO2 100%   BMI 26.08 kg/m   PROVIDERS: [name], MD is PCP   [name], MD is Cardiologist  LABS: Labs reviewed: Acceptable for surgery. (all labs ordered are listed, but only abnormal results are displayed)  Labs Reviewed  BASIC METABOLIC PANEL - Abnormal; Notable for the following components:      Result Value   Glucose, Bld 117 (*)    All other components within normal limits  HEMOGLOBIN A1C - Abnormal; Notable for the following components:   Hgb A1c MFr Bld 5.9 (*)    All other components within normal limits  CBC     IMAGES:   EKG: 04/27/2019 Rate 62 bpm Normal sinus rhythm  Since previous tracing Atrial fibrillation has converted to NSR  CV: Echo 04/24/2019 IMPRESSIONS   1. Left ventricular ejection fraction, by visual estimation, is 60 to  65%. The left ventricle has normal function. There is moderately increased  left ventricular hypertrophy.  2. Left ventricular diastolic  function could not be evaluated.  3. Global right ventricle has normal systolic function.The right  ventricular size is normal. No increase in right ventricular wall  thickness.  4. Left atrial size was normal.  5. Right atrial size was normal.  6. The mitral valve is abnormal. Trace mitral valve regurgitation.  7. The tricuspid valve is grossly normal. Tricuspid valve regurgitation  is trivial.  8. Aortic valve area, by VTI measures 2.00 cm.  9. Aortic valve mean gradient measures 14.0 mmHg.  10. Aortic valve peak gradient measures 26.7 mmHg.  11. The aortic valve has an indeterminant number of cusps. Aortic valve  regurgitation is mild. Mild aortic valve stenosis.  12. The pulmonic valve was grossly normal. Pulmonic valve regurgitation is  not visualized.  13. Aortic dilatation noted.  14. There is mild dilatation of the ascending aorta measuring 42 mm.   Cardiac Cath 04/13/2018 No significant coronary obstruction.   He can resume apixaban tomorrow morning.   Myocardial Perfusion 01/21/2017  Nuclear stress EF: 56%.  Blood pressure demonstrated a normal response to exercise.  There was no ST segment deviation noted during stress.  The study is normal.  This is a low risk study.  The left ventricular ejection fraction is normal (55-65%).   Normal exercise nuclear stress test with no evidence for prior infarct or ischemia. Excellent exercise capacity. Normal BP response to exercise.  Past Medical History:  Diagnosis Date  .  Aortic aneurysm, thoracic ([place]) 11/18/2016   bicuspid aortic valve. aortic stenosis, aortic regurg Ectatic ascending aorta measured 3.8 cm diameter, stable. patient denies  . Aortic insufficiency    Mild  . Aortic stenosis    Mild-Moderate  . Asbestos exposure CT 06/2013   Noncalcified pleural plaques bilat; no signs of malignancy.  . Bicuspid aortic valve    aortic stenosis, aortic regurgitation; Dr. [name] plans to get an echo and MRA  chest 12/2016  . CAD (coronary artery disease)    LAD dz but no corresponding ischemia on myoview  . Chronic renal insufficiency, stage III (moderate) 06/2013   CrCl about 50 ml/min  . Confusion summer 2017   Eval by Dr. Erlinda Hong, neurology---suspected dx is impaired concentration/focus, but EEG being done to further r/o seizures.  . Eustachian tube dysfunction   . Fatty liver 04/2012; 06/2013   Noted on noncontrast abd CT done during trauma w/u when tractor rolled onto his knee.  Also noted on noncontrast chest CT done to screen for asbestos lung damage. pt denies  . GERD (gastroesophageal reflux disease)   . H/O hiatal hernia   . Hearing loss   . Heart murmur   . Hematuria   . Hyperlipidemia   . Hypertension   . LVH (left ventricular hypertrophy)    Moderate  . Nephrolithiasis   . OSA (obstructive sleep apnea)    Mild no CPAP ordered  . Paroxysmal atrial fibrillation (HCC)    ASA 325 + plavix as of 01/06/16;  electrophys did placement of Watchman left atrial appendage occlusive device 10/2015.  Marland Kitchen Peyronie's disease   . Pre-diabetes   . Prediabetes 2015   A1c 6.4%.  A1c 6.1% 08/2015    Past Surgical History:  Procedure Laterality Date  . CARDIAC CATHETERIZATION  06/24/10  . cataract Left 06/22/2019  . CIRCUMCISION  01/18/2012   Procedure: CIRCUMCISION ADULT;  Surgeon: [name], MD;  Location: [facility];  Service: Urology;  Laterality: N/A;  30 mins requested for this case   . COLONOSCOPY    . CORONARY ANGIOGRAPHY N/A 04/13/2018   Procedure: CORONARY ANGIOGRAPHY (CATH LAB);  Surgeon: [name], MD;  Location: [place] [facility];  Service: Cardiovascular;  Laterality: N/A;  . EEG  01/2016   NORMAL ([place] [facility])  . ESOPHAGOGASTRODUODENOSCOPY  02/23/2011   Barrett's esophagus, hiatal hernia - dilated for dysphagia  . Implantable loop recorder placement  06/29/14   MDT LINQ implanted by Dr [name] in the office as part of the RIO II protocol. + A fib  confirmation.  Marland Kitchen LEFT ATRIAL APPENDAGE OCCLUSION N/A 10/31/2015   Procedure: LEFT ATRIAL APPENDAGE OCCLUSION;  Surgeon: [name], MD;  Location: [place] [facility];  Service: Cardiovascular;  Laterality: N/A;  . LEFT ATRIAL APPENDAGE OCCLUSION    . MYRINGOTOMY Right 04/06/2018  . SHOULDER SURGERY Right    Arthroscopic 02/23/12  . TEE WITHOUT CARDIOVERSION N/A 07/25/2015   Dr. [name]: EF 60-65%, normal LV wall motion, functionally bicuspid aortic valve with mild AI and mild AS, ascending aorta dilated to 4 cm, left atrial appendage measured for Watchman procedure/device  . TEE WITHOUT CARDIOVERSION N/A 12/27/2015   Procedure: TRANSESOPHAGEAL ECHOCARDIOGRAM (TEE);  Surgeon: Dorothy Spark, MD;  Location: [facility];  Service: Cardiovascular;  Laterality: N/A;  . TENDON REPAIR  11/06/2011   Procedure: TENDON REPAIR;  Surgeon: Cammie Sickle., MD;  Location: [place];  Service: Orthopedics;  Laterality: Left;  explore/repair tendons left hand   .  WATCHMAN PLACEMENT  10/31/15   Left atrial appendage occlusive device     MEDICATIONS: . amLODipine (NORVASC) 10 MG tablet  . atorvastatin (LIPITOR) 40 MG tablet  . benazepril (LOTENSIN) 40 MG tablet  . ELIQUIS 5 MG TABS tablet  . esomeprazole (NEXIUM) 20 MG packet  . metFORMIN (GLUCOPHAGE-XR) 500 MG 24 hr tablet  . metoprolol succinate (TOPROL-XL) 25 MG 24 hr tablet  . naproxen sodium (ALEVE) 220 MG tablet   No current facility-administered medications for this encounter.     Maia Plan [facility]Surgical Testing [phone] 08/23/19  1:54 PM

## 2019-08-24 ENCOUNTER — Other Ambulatory Visit (HOSPITAL_COMMUNITY)
Admission: RE | Admit: 2019-08-24 | Discharge: 2019-08-24 | Disposition: A | Discharge status: Home / Self Care | Payer: Medicare Other | Source: Ambulatory Visit | Attending: Urology | Admitting: Urology

## 2019-08-24 DIAGNOSIS — U071 COVID-19: Secondary | ICD-10-CM | POA: Diagnosis not present

## 2019-08-24 DIAGNOSIS — Z20822 Contact with and (suspected) exposure to covid-19: Secondary | ICD-10-CM | POA: Diagnosis present

## 2019-08-24 LAB — SARS CORONAVIRUS 2 (TAT 6-24 HRS): SARS Coronavirus 2: POSITIVE — AB

## 2019-08-24 NOTE — Progress Notes (Signed)
Clearance received 08/24/19  from Dr. [name] placed in patient's chart.

## 2019-08-25 ENCOUNTER — Telehealth: Payer: Self-pay | Admitting: Adult Health

## 2019-08-25 NOTE — Progress Notes (Signed)
Pt's procedure has been moved from 3/15 to 3/29 due to a + covid result.

## 2019-08-25 NOTE — Telephone Encounter (Signed)
  I connected by phone with Samuel Willis on 08/25/2019 at 9:22 AM to discuss the potential use of an new treatment for mild to moderate COVID-19 viral infection in non-hospitalized patients.  This patient is a 74 y.o. male that meets the FDA criteria for Emergency Use Authorization of bamlanivimab or casirivimab\imdevimab.  Has a (+) direct SARS-CoV-2 viral test result  Has mild or moderate COVID-19- NO SYMPTOMS at present. He was tested for upcoming procedure.  Is ? 74 years of age and weighs ? 40 kg  Is NOT hospitalized due to COVID-19  Is NOT requiring oxygen therapy or requiring an increase in baseline oxygen flow rate due to COVID-19  Is within 10 days of symptom onset  Has at least one of the high risk factor(s) for progression to severe COVID-19 and/or hospitalization as defined in EUA.  Specific high risk criteria : >/= 73 yo   I have spoken and communicated the following to the patient or parent/caregiver:  1. FDA has authorized the emergency use of bamlanivimab and casirivimab\imdevimab for the treatment of mild to moderate COVID-19 in adults and pediatric patients with positive results of direct SARS-CoV-2 viral testing who are 56 years of age and older weighing at least 40 kg, and who are at high risk for progressing to severe COVID-19 and/or hospitalization.  2. The significant known and potential risks and benefits of bamlanivimab and casirivimab\imdevimab, and the extent to which such potential risks and benefits are unknown.  3. Information on available alternative treatments and the risks and benefits of those alternatives, including clinical trials.  4. Patients treated with bamlanivimab and casirivimab\imdevimab should continue to self-isolate and use infection control measures (e.g., wear mask, isolate, social distance, avoid sharing personal items, clean and disinfect "high touch" surfaces, and frequent handwashing) according to CDC guidelines.   5. The patient  or parent/caregiver has the option to accept or refuse bamlanivimab or casirivimab\imdevimab .  After reviewing this information with the patient, The patient has DECLINED offer to receive the infusion. He is not experiencing any sx's at present.  He was COVID tested for upcoming procedure. Infusion Center contact information provided if he develops any sx's.  Valetta Fuller D Kathryn Cosby 08/25/2019 9:22 AM

## 2019-08-30 ENCOUNTER — Other Ambulatory Visit (HOSPITAL_COMMUNITY): Payer: Medicare Other

## 2019-09-01 ENCOUNTER — Other Ambulatory Visit: Payer: Self-pay | Admitting: Urology

## 2019-09-04 ENCOUNTER — Other Ambulatory Visit: Payer: Self-pay

## 2019-09-04 ENCOUNTER — Encounter (HOSPITAL_COMMUNITY)
Admission: RE | Admit: 2019-09-04 | Discharge: 2019-09-04 | Disposition: A | Discharge status: Home / Self Care | Payer: Medicare Other | Source: Ambulatory Visit | Attending: Urology | Admitting: Urology

## 2019-09-04 DIAGNOSIS — Z01812 Encounter for preprocedural laboratory examination: Secondary | ICD-10-CM | POA: Insufficient documentation

## 2019-09-04 LAB — BASIC METABOLIC PANEL
Anion gap: 7 (ref 5–15)
BUN: 23 mg/dL (ref 8–23)
CO2: 25 mmol/L (ref 22–32)
Calcium: 9.2 mg/dL (ref 8.9–10.3)
Chloride: 107 mmol/L (ref 98–111)
Creatinine, Ser: 1.21 mg/dL (ref 0.61–1.24)
GFR calc Af Amer: >60 mL/min (ref >60)
GFR calc non Af Amer: 59 mL/min — ABNORMAL LOW (ref >60)
Glucose, Bld: 124 mg/dL — ABNORMAL HIGH (ref 70–99)
Potassium: 4.4 mmol/L (ref 3.5–5.1)
Sodium: 139 mmol/L (ref 135–145)

## 2019-09-04 LAB — CBC
HCT: 42.9 % (ref 39.0–52.0)
Hemoglobin: 14.4 g/dL (ref 13.0–17.0)
MCH: 32.1 pg (ref 26.0–34.0)
MCHC: 33.6 g/dL (ref 30.0–36.0)
MCV: 95.8 fL (ref 80.0–100.0)
Platelets: 148 10*3/uL — ABNORMAL LOW (ref 150–400)
RBC: 4.48 MIL/uL (ref 4.22–5.81)
RDW: 12.8 % (ref 11.5–15.5)
WBC: 3.9 10*3/uL — ABNORMAL LOW (ref 4.0–10.5)
nRBC: 0 % (ref 0.0–0.2)

## 2019-09-04 NOTE — Progress Notes (Signed)
Patient called and instructed with new date and time for surgery and reviewed presurgery instructions. Patient aware may have clear liquids from 12 midnite until 0730am day of surgery.  Reviewed clear liquids with patient.  Patient has preprocedure instructions for Eliquis.  Patient still has hibiclens and preprocedure instructions.  Voiced understanding.

## 2019-09-04 NOTE — Progress Notes (Signed)
Anesthesia Review:  PCP: Cardiologist : Chest x-ray : EKG : Echo : Cardiac Cath :  Sleep Study/ CPAP : Fasting Blood Sugar :      / Checks Blood Sugar -- times a day:   Blood Thinner/ Instructions /Last Dose: ASA / Instructions/ Last Dose :   Patient denies shortness of breath, chest pain, fever, and cough at this phone interview. Patient was cancelled from 08/28/19 due to positive covid test on 08/24/19.  Anesthesia has previously reviewed this chart.  Patient was rescheduled to 09/11/19.  Patient has instructions regarding Eliquis and previous instructions.  Had labs repeated on 09/04/19.  REsults in epic.  Juluis Rainier.

## 2019-09-11 ENCOUNTER — Encounter (HOSPITAL_COMMUNITY): Payer: Self-pay | Admitting: Urology

## 2019-09-11 ENCOUNTER — Ambulatory Visit (HOSPITAL_COMMUNITY): Payer: Medicare Other | Admitting: Physician Assistant

## 2019-09-11 ENCOUNTER — Observation Stay (HOSPITAL_COMMUNITY)
Admission: RE | Admit: 2019-09-11 | Discharge: 2019-09-12 | Disposition: A | Discharge status: Home / Self Care | Payer: Medicare Other | Attending: Urology | Admitting: Urology

## 2019-09-11 ENCOUNTER — Ambulatory Visit (HOSPITAL_COMMUNITY): Payer: Medicare Other

## 2019-09-11 ENCOUNTER — Ambulatory Visit (HOSPITAL_COMMUNITY): Payer: Medicare Other | Admitting: Certified Registered Nurse Anesthetist

## 2019-09-11 ENCOUNTER — Encounter (HOSPITAL_COMMUNITY)
Admission: RE | Disposition: A | Discharge status: Home / Self Care | Payer: Self-pay | Source: Home / Self Care | Attending: Urology

## 2019-09-11 ENCOUNTER — Other Ambulatory Visit: Payer: Self-pay

## 2019-09-11 DIAGNOSIS — I10 Essential (primary) hypertension: Secondary | ICD-10-CM | POA: Diagnosis not present

## 2019-09-11 DIAGNOSIS — E119 Type 2 diabetes mellitus without complications: Secondary | ICD-10-CM | POA: Insufficient documentation

## 2019-09-11 DIAGNOSIS — N132 Hydronephrosis with renal and ureteral calculous obstruction: Principal | ICD-10-CM | POA: Insufficient documentation

## 2019-09-11 DIAGNOSIS — I2510 Atherosclerotic heart disease of native coronary artery without angina pectoris: Secondary | ICD-10-CM | POA: Insufficient documentation

## 2019-09-11 DIAGNOSIS — I4891 Unspecified atrial fibrillation: Secondary | ICD-10-CM | POA: Insufficient documentation

## 2019-09-11 DIAGNOSIS — N201 Calculus of ureter: Secondary | ICD-10-CM | POA: Diagnosis not present

## 2019-09-11 DIAGNOSIS — K219 Gastro-esophageal reflux disease without esophagitis: Secondary | ICD-10-CM | POA: Diagnosis not present

## 2019-09-11 DIAGNOSIS — E7800 Pure hypercholesterolemia, unspecified: Secondary | ICD-10-CM | POA: Diagnosis not present

## 2019-09-11 DIAGNOSIS — Z7901 Long term (current) use of anticoagulants: Secondary | ICD-10-CM | POA: Diagnosis not present

## 2019-09-11 DIAGNOSIS — Z79899 Other long term (current) drug therapy: Secondary | ICD-10-CM | POA: Diagnosis not present

## 2019-09-11 DIAGNOSIS — Z87891 Personal history of nicotine dependence: Secondary | ICD-10-CM | POA: Diagnosis not present

## 2019-09-11 HISTORY — PX: CYSTOSCOPY/URETEROSCOPY/HOLMIUM LASER/STENT PLACEMENT: SHX6546

## 2019-09-11 SURGERY — CYSTOSCOPY/URETEROSCOPY/HOLMIUM LASER/STENT PLACEMENT
Anesthesia: General | Laterality: Left

## 2019-09-11 MED ORDER — CEFAZOLIN SODIUM-DEXTROSE 2-4 GM/100ML-% IV SOLN
2.0000 g | INTRAVENOUS | Status: AC
  Administered 2019-09-11: 2 g via INTRAVENOUS

## 2019-09-11 MED ORDER — LACTATED RINGERS IV SOLN: INTRAVENOUS | Status: DC

## 2019-09-11 MED ORDER — LIDOCAINE 2% (20 MG/ML) 5 ML SYRINGE
INTRAMUSCULAR | Status: DC | PRN
  Administered 2019-09-11: 80 mg via INTRAVENOUS

## 2019-09-11 MED ORDER — FENTANYL CITRATE (PF) 100 MCG/2ML IJ SOLN
INTRAMUSCULAR | Status: AC
  Filled 2019-09-11: qty 2

## 2019-09-11 MED ORDER — PROPOFOL 10 MG/ML IV BOLUS
INTRAVENOUS | Status: AC
  Filled 2019-09-11: qty 20

## 2019-09-11 MED ORDER — FENTANYL CITRATE (PF) 100 MCG/2ML IJ SOLN
25.0000 ug | INTRAMUSCULAR | Status: DC | PRN
  Administered 2019-09-11 (×2): 50 ug via INTRAVENOUS

## 2019-09-11 MED ORDER — ACETAMINOPHEN 325 MG PO TABS
650.0000 mg | ORAL_TABLET | ORAL | Status: DC | PRN
  Administered 2019-09-12 (×2): 650 mg via ORAL
  Filled 2019-09-11 (×2): qty 2

## 2019-09-11 MED ORDER — CEFAZOLIN SODIUM-DEXTROSE 2-4 GM/100ML-% IV SOLN
INTRAVENOUS | Status: AC
  Filled 2019-09-11: qty 100

## 2019-09-11 MED ORDER — DOCUSATE SODIUM 100 MG PO CAPS
100.0000 mg | ORAL_CAPSULE | Freq: Two times a day (BID) | ORAL | Status: DC
  Filled 2019-09-11 (×2): qty 1

## 2019-09-11 MED ORDER — 0.9 % SODIUM CHLORIDE (POUR BTL) OPTIME
TOPICAL | Status: DC | PRN
  Administered 2019-09-11: 1000 mL

## 2019-09-11 MED ORDER — ONDANSETRON HCL 4 MG/2ML IJ SOLN
INTRAMUSCULAR | Status: AC
  Filled 2019-09-11: qty 2

## 2019-09-11 MED ORDER — BELLADONNA ALKALOIDS-OPIUM 16.2-60 MG RE SUPP: 1.0000 | Freq: Four times a day (QID) | RECTAL | Status: DC | PRN

## 2019-09-11 MED ORDER — FENTANYL CITRATE (PF) 100 MCG/2ML IJ SOLN: INTRAMUSCULAR | Status: DC | PRN

## 2019-09-11 MED ORDER — OXYCODONE HCL 5 MG PO TABS
5.0000 mg | ORAL_TABLET | ORAL | Status: DC | PRN
  Filled 2019-09-11 (×2): qty 1

## 2019-09-11 MED ORDER — METOPROLOL SUCCINATE ER 25 MG PO TB24
25.0000 mg | ORAL_TABLET | Freq: Every day | ORAL | Status: DC
  Administered 2019-09-11: 25 mg via ORAL
  Filled 2019-09-11: qty 1

## 2019-09-11 MED ORDER — DEXAMETHASONE SODIUM PHOSPHATE 10 MG/ML IJ SOLN
INTRAMUSCULAR | Status: AC
  Filled 2019-09-11: qty 1

## 2019-09-11 MED ORDER — IOHEXOL 300 MG/ML  SOLN
INTRAMUSCULAR | Status: DC | PRN
  Administered 2019-09-11: 20 mL

## 2019-09-11 MED ORDER — ONDANSETRON HCL 4 MG/2ML IJ SOLN: 4.0000 mg | INTRAMUSCULAR | Status: DC | PRN

## 2019-09-11 MED ORDER — PHENYLEPHRINE 40 MCG/ML (10ML) SYRINGE FOR IV PUSH (FOR BLOOD PRESSURE SUPPORT)
PREFILLED_SYRINGE | INTRAVENOUS | Status: DC | PRN
  Administered 2019-09-11: 200 ug via INTRAVENOUS
  Administered 2019-09-11: 80 ug via INTRAVENOUS

## 2019-09-11 MED ORDER — LISINOPRIL 20 MG PO TABS
40.0000 mg | ORAL_TABLET | Freq: Every day | ORAL | Status: DC
  Administered 2019-09-11 – 2019-09-12 (×2): 40 mg via ORAL
  Filled 2019-09-11 (×3): qty 2

## 2019-09-11 MED ORDER — MORPHINE SULFATE (PF) 4 MG/ML IV SOLN: 2.0000 mg | INTRAVENOUS | Status: DC | PRN

## 2019-09-11 MED ORDER — LIDOCAINE 2% (20 MG/ML) 5 ML SYRINGE
INTRAMUSCULAR | Status: AC
  Filled 2019-09-11: qty 5

## 2019-09-11 MED ORDER — PROPOFOL 10 MG/ML IV BOLUS
INTRAVENOUS | Status: DC | PRN
  Administered 2019-09-11: 160 mg via INTRAVENOUS

## 2019-09-11 MED ORDER — FENTANYL CITRATE (PF) 100 MCG/2ML IJ SOLN
INTRAMUSCULAR | Status: DC | PRN
  Administered 2019-09-11 (×2): 50 ug via INTRAVENOUS

## 2019-09-11 MED ORDER — DEXAMETHASONE SODIUM PHOSPHATE 10 MG/ML IJ SOLN
INTRAMUSCULAR | Status: DC | PRN
  Administered 2019-09-11: 4 mg via INTRAVENOUS

## 2019-09-11 MED ORDER — SODIUM CHLORIDE 0.9 % IR SOLN
Status: DC | PRN
  Administered 2019-09-11: 3000 mL

## 2019-09-11 MED ORDER — ONDANSETRON HCL 4 MG/2ML IJ SOLN
INTRAMUSCULAR | Status: DC | PRN
  Administered 2019-09-11: 4 mg via INTRAVENOUS

## 2019-09-11 SURGICAL SUPPLY — 20 items
BAG URO CATCHER STRL LF (MISCELLANEOUS) ×2 IMPLANT
BASKET LASER NITINOL 1.9FR (BASKET) IMPLANT
BASKET ZERO TIP NITINOL 2.4FR (BASKET) IMPLANT
CATH INTERMIT  6FR 70CM (CATHETERS) ×2 IMPLANT
CLOTH BEACON ORANGE TIMEOUT ST (SAFETY) ×2 IMPLANT
EXTRACTOR STONE 1.7FRX115CM (UROLOGICAL SUPPLIES) IMPLANT
FIBER LASER FLEXIVA 365 (UROLOGICAL SUPPLIES) IMPLANT
FIBER LASER TRAC TIP (UROLOGICAL SUPPLIES) ×2 IMPLANT
GLOVE BIO SURGEON STRL SZ7.5 (GLOVE) ×2 IMPLANT
GOWN STRL REUS W/TWL XL LVL3 (GOWN DISPOSABLE) ×2 IMPLANT
GUIDEWIRE ANG ZIPWIRE 038X150 (WIRE) ×2 IMPLANT
GUIDEWIRE STR DUAL SENSOR (WIRE) ×4 IMPLANT
KIT TURNOVER KIT A (KITS) IMPLANT
MANIFOLD NEPTUNE II (INSTRUMENTS) ×2 IMPLANT
PACK CYSTO (CUSTOM PROCEDURE TRAY) ×2 IMPLANT
SHEATH URETERAL 12FRX28CM (UROLOGICAL SUPPLIES) IMPLANT
SHEATH URETERAL 12FRX35CM (MISCELLANEOUS) IMPLANT
STENT CONTOUR 6FRX26X.038 (STENTS) ×2 IMPLANT
TUBING CONNECTING 10 (TUBING) ×2 IMPLANT
TUBING UROLOGY SET (TUBING) ×2 IMPLANT

## 2019-09-11 NOTE — Anesthesia Postprocedure Evaluation (Signed)
Anesthesia Post Note  Patient: Samuel Willis  Procedure(s) Performed: CYSTOSCOPY/URETEROSCOPY/HOLMIUM LASER/STENT PLACEMENT (Left )     Patient location during evaluation: PACU Anesthesia Type: General Level of consciousness: awake Pain management: pain level controlled Respiratory status: spontaneous breathing Cardiovascular status: bradycardic Postop Assessment: no apparent nausea or vomiting Anesthetic complications: no    Last Vitals:  Vitals:   09/11/19 1515 09/11/19 1530  BP: 126/72   Pulse: (!) 53 (!) 56  Resp: 15 12  Temp:    SpO2: 97% 100%    Last Pain:  Vitals:   09/11/19 1530  PainSc: 0-No pain                 Nakea Gouger

## 2019-09-11 NOTE — Anesthesia Preprocedure Evaluation (Signed)
Anesthesia Evaluation  Patient identified by MRN, date of birth, ID band Patient awake    Reviewed: Allergy & Precautions, NPO status   Airway Mallampati: II  TM Distance: >3 FB     Dental   Pulmonary former smoker,    breath sounds clear to auscultation       Cardiovascular hypertension, + CAD   Rhythm:Regular Rate:Normal     Neuro/Psych    GI/Hepatic hiatal hernia, GERD  ,  Endo/Other  diabetes  Renal/GU Renal disease     Musculoskeletal   Abdominal   Peds  Hematology   Anesthesia Other Findings   Reproductive/Obstetrics                             Anesthesia Physical Anesthesia Plan  ASA: III  Anesthesia Plan: General   Post-op Pain Management:    Induction: Intravenous  PONV Risk Score and Plan: Ondansetron, Dexamethasone and Midazolam  Airway Management Planned: LMA  Additional Equipment:   Intra-op Plan:   Post-operative Plan: Extubation in OR  Informed Consent: I have reviewed the patients History and Physical, chart, labs and discussed the procedure including the risks, benefits and alternatives for the proposed anesthesia with the patient or authorized representative who has indicated his/her understanding and acceptance.     Dental advisory given  Plan Discussed with: Anesthesiologist and CRNA  Anesthesia Plan Comments:         Anesthesia Quick Evaluation

## 2019-09-11 NOTE — Anesthesia Procedure Notes (Signed)
Procedure Name: LMA Insertion Date/Time: 09/11/2019 2:15 PM Performed by: Montel Clock, CRNA Pre-anesthesia Checklist: Patient identified, Emergency Drugs available, Suction available, Patient being monitored and Timeout performed Patient Re-evaluated:Patient Re-evaluated prior to induction Oxygen Delivery Method: Circle system utilized Preoxygenation: Pre-oxygenation with 100% oxygen Induction Type: IV induction LMA: LMA with gastric port inserted LMA Size: 4.0 Number of attempts: 1 Dental Injury: Teeth and Oropharynx as per pre-operative assessment

## 2019-09-11 NOTE — Progress Notes (Signed)
Called patient's wife to inform her patient's surgery started 1420. Dr Gloriann Loan will call her after surgery. She verbalizes understanding.

## 2019-09-11 NOTE — Progress Notes (Signed)
Verbal order by MD Claudia Desanctis for Metoprolol 25 mg PO daily given.

## 2019-09-11 NOTE — Op Note (Signed)
Operative Note   Preoperative diagnosis:  1.  Left ureteral calculus  Postoperative diagnosis: 1.  Left ureteral calculus  Procedure(s): 1.  Cystoscopy with left retrograde pyelogram, left ureteroscopy with laser lithotripsy, ureteral stent placement  Surgeon: Link Snuffer, MD  Assistants: None  Anesthesia: General  Complications: None immediate  EBL: Minimal  Specimens: 1.  None  Drains/Catheters: 1.  6 x 24 double-J ureteral stent  Intraoperative findings: 1.  Normal anterior urethra 2.  Normal bladder mucosa 3.  Retrograde pyelogram revealed complete occlusion of the ureter at the proximal ureter at the level of the stone.  I was not able to get a wire past that area.  I was able to advance a semirigid ureteroscope up to the level of obstruction and it was evident that the stone had been there for quite some time as evidenced by growth of a membrane/tissue over the stone.  I was able to laser this as well as the stone to gain access to the ureter and place a stent.  It was not safe to go up with the digital ureteroscope to break up the rest of the fragments that were retropulsed into the kidney.  Retrograde pyelogram after this revealed severe hydronephrosis  Indication: 74 year old male underwent hematuria work-up and was found to have a left ureteral calculus with severe hydronephrosis and some parenchymal thinning of the kidney.  He presents for the previously mentioned operation.  Description of procedure:  The patient was identified and consent was obtained.  The patient was taken to the operating room and placed in the supine position.  The patient was placed under general anesthesia.  Perioperative antibiotics were administered.  The patient was placed in dorsal lithotomy.  Patient was prepped and draped in a standard sterile fashion and a timeout was performed.  A 21 French rigid cystoscope was advanced into the urethra and into the bladder.  Complete cystoscopy was  performed with findings noted above.  I advanced a wire up the ureter and met resistance at the proximal ureter.  I advanced a semirigid ureteroscope alongside the wire and noted the stone of interest was completely occluding the lumen.  I shot a retrograde pyelogram through the scope and no contrast went by the stone.  There was growth of a membrane/tissue over the stone indicating it had been there for quite some time causing this obstruction.  I tried multiple wires including a straight Glidewire, curved Glidewire, sensor wire.  None of these were able to access the kidney due to the complete obstruction.  The stone was immediately evident and therefore I decided to very carefully laser the middle of the stone as well as the overlying membrane.  I took care not to laser the periphery to avoid ureteral injury.  Once the stone was broken up I could see a true lumen.  The stone fragments retropulsed into the kidney.  There was a great deal of inflammation and irritation at the level of obstruction.  I was able to carefully pass the scope past this and shot a retrograde pyelogram confirming we were in the kidney and confirming severe hydronephrosis.  I advanced a wire through the scope and withdrew the scope carefully.  There was no obvious ureteral injury though there was quite a lot of inflammation at the level of stone impaction.  I felt it was safer to just leave a stent instead of trying to go up with a digital ureteroscope.  Therefore, backloaded the wire onto the rigid cystoscope and  advanced that into the bladder followed by routine placement of a 6 x 26 double-J ureteral stent.  Fluoroscopy confirmed proximal placement and direct visualization confirmed a good coil within the bladder.  I drained the bladder and withdrew the scope.  Patient tolerated procedure well was stable postoperatively.  Plan: Return in 1 to 2 weeks for repeat ureteroscopy to inspect the kidney and ensure all stone fragments are  completely broken up.

## 2019-09-11 NOTE — Discharge Instructions (Signed)
[facility] [phone] Post Ureteroscopy With or Without Stent Instructions  Definitions:  Ureter: The duct that transports urine from the kidney to the bladder. Stent:   A plastic hollow tube that is placed into the ureter, from the kidney to the                 bladder to prevent the ureter from swelling shut.  GENERAL INSTRUCTIONS:  Despite the fact that no skin incisions were used, the area around the ureter and bladder is raw and irritated. The stent is a foreign body which will further irritate the bladder wall. This irritation is manifested by increased frequency of urination, both day and night, and by an increase in the urge to urinate. In some, the urge to urinate is present almost always. Sometimes the urge is strong enough that you may not be able to stop yourself from urinating. The only real cure is to remove the stent and then give time for the bladder wall to heal which can't be done until the danger of the ureter swelling shut has passed, which varies.  You may see some blood in your urine while the stent is in place and a few days afterwards. Do not be alarmed, even if the urine was clear for a while. Get off your feet and drink lots of fluids until clearing occurs. If you start to pass clots or don't improve, call us.  DIET: You may return to your normal diet immediately. Because of the raw surface of your bladder, alcohol, spicy foods, acid type foods and drinks with caffeine may cause irritation or frequency and should be used in moderation. To keep your urine flowing freely and to avoid constipation, drink plenty of fluids during the day ( 8-10 glasses ). Tip: Avoid cranberry juice because it is very acidic.  ACTIVITY: Your physical activity doesn't need to be restricted. However, if you are very active, you may see some blood in your urine. We suggest that you reduce your activity under these circumstances until the bleeding has stopped.  BOWELS: It is  important to keep your bowels regular during the postoperative period. Straining with bowel movements can cause bleeding. A bowel movement every other day is reasonable. Use a mild laxative if needed, such as Milk of Magnesia 2-3 tablespoons, or 2 Dulcolax tablets. Call if you continue to have problems. If you have been taking narcotics for pain, before, during or after your surgery, you may be constipated. Take a laxative if necessary.   MEDICATION: You should resume your pre-surgery medications unless told not to. You may take oxybutynin or flomax if prescribed for bladder spasms or discomfort from the stent Take pain medication as directed for pain refractory to conservative management  PROBLEMS YOU SHOULD REPORT TO [place]:  Fevers over 100.5 Fahrenheit.  Heavy bleeding, or clots ( See above notes about blood in urine ).  Inability to urinate.  Drug reactions ( hives, rash, nausea, vomiting, diarrhea ).  Severe burning or pain with urination that is not improving.   General Anesthesia, Adult, Care After This sheet gives you information about how to care for yourself after your procedure. Your health care provider may also give you more specific instructions. If you have problems or questions, contact your health care provider. What can I expect after the procedure? After the procedure, the following side effects are common:  Pain or discomfort at the IV site.  Nausea.  Vomiting.  Sore throat.  Trouble concentrating.  Feeling  cold or chills.  Weak or tired.  Sleepiness and fatigue.  Soreness and body aches. These side effects can affect parts of the body that were not involved in surgery. Follow these instructions at home:  For at least 24 hours after the procedure:  Have a responsible adult stay with you. It is important to have someone help care for you until you are awake and alert.  Rest as needed.  Do not: ? Participate in activities in which you could fall or  become injured. ? Drive. ? Use heavy machinery. ? Drink alcohol. ? Take sleeping pills or medicines that cause drowsiness. ? Make important decisions or sign legal documents. ? Take care of children on your own. Eating and drinking  Follow any instructions from your health care provider about eating or drinking restrictions.  When you feel hungry, start by eating small amounts of foods that are soft and easy to digest (bland), such as toast. Gradually return to your regular diet.  Drink enough fluid to keep your urine pale yellow.  If you vomit, rehydrate by drinking water, juice, or clear broth. General instructions  If you have sleep apnea, surgery and certain medicines can increase your risk for breathing problems. Follow instructions from your health care provider about wearing your sleep device: ? Anytime you are sleeping, including during daytime naps. ? While taking prescription pain medicines, sleeping medicines, or medicines that make you drowsy.  Return to your normal activities as told by your health care provider. Ask your health care provider what activities are safe for you.  Take over-the-counter and prescription medicines only as told by your health care provider.  If you smoke, do not smoke without supervision.  Keep all follow-up visits as told by your health care provider. This is important. Contact a health care provider if:  You have nausea or vomiting that does not get better with medicine.  You cannot eat or drink without vomiting.  You have pain that does not get better with medicine.  You are unable to pass urine.  You develop a skin rash.  You have a fever.  You have redness around your IV site that gets worse. Get help right away if:  You have difficulty breathing.  You have chest pain.  You have blood in your urine or stool, or you vomit blood. Summary  After the procedure, it is common to have a sore throat or nausea. It is also common  to feel tired.  Have a responsible adult stay with you for the first 24 hours after general anesthesia. It is important to have someone help care for you until you are awake and alert.  When you feel hungry, start by eating small amounts of foods that are soft and easy to digest (bland), such as toast. Gradually return to your regular diet.  Drink enough fluid to keep your urine pale yellow.  Return to your normal activities as told by your health care provider. Ask your health care provider what activities are safe for you. This information is not intended to replace advice given to you by your health care provider. Make sure you discuss any questions you have with your health care provider. Document Revised: 06/04/2017 Document Reviewed: 01/15/2017 Elsevier Patient Education  [place].

## 2019-09-11 NOTE — Progress Notes (Signed)
MD Claudia Desanctis was paged because patient was requesting his blood pressure medication, verbal order for Lisinopril 40 mg PO daily given.

## 2019-09-11 NOTE — Transfer of Care (Signed)
Immediate Anesthesia Transfer of Care Note  Patient: Samuel Willis  Procedure(s) Performed: CYSTOSCOPY/URETEROSCOPY/HOLMIUM LASER/STENT PLACEMENT (Left )  Patient Location: PACU  Anesthesia Type:General  Level of Consciousness: drowsy and patient cooperative  Airway & Oxygen Therapy: Patient Spontanous Breathing and Patient connected to face mask oxygen  Post-op Assessment: Report given to RN and Post -op Vital signs reviewed and stable  Post vital signs: Reviewed and stable  Last Vitals:  Vitals Value Taken Time  BP 108/48 09/11/19 1500  Temp 36.5 C 09/11/19 1458  Pulse 50 09/11/19 1501  Resp 11 09/11/19 1501  SpO2 96 % 09/11/19 1501  Vitals shown include unvalidated device data.  Last Pain:  Vitals:   09/11/19 1230  PainSc: 0-No pain         Complications: No apparent anesthesia complications

## 2019-09-11 NOTE — H&P (Signed)
CC/HPI: CC: Gross hematuria  HPI:  07/21/2019  74 year old male experienced gross hematuria about 3 weeks ago. He is on Eliquis for atrial fibrillation. [place] and had some back pain. It resolved but when he went to void, he had a large amount of gross blood. Gross hematuria has resolved but he has persistent microscopic hematuria today. He also complains about a 45 degree curvature to the left of his penis. He is not bothered by this. It does not hinder sexual activity. He does not think he has had a recent PSA.   08/15/2019  Patient presents after undergoing a CT IVP. This revealed an obstructing left ureteral calculus. May have been present for some time given the degree of hydronephrosis and some parenchymal thinning. He has occasional lower left back pain but it is not severe. Urinalysis negative today. He presents today for cystoscopy to rule out other potential causes for hematuria as well. He has had stones in the past but has never required intervention. PSA was 3.5.     ALLERGIES: No Allergies    MEDICATIONS: Nexium 20 mg capsule,delayed release  Amlodipine Besylate 10 mg tablet  Atorvastatin Calcium 40 mg tablet  Benazepril Hcl 40 mg tablet  Eliquis 5 mg tablet     GU PSH: Locm 300-399Mg /Ml Iodine,1Ml - 08/01/2019 Non-Newborn Circumcision - 2013       [place] Notes: Circumcision No Clamp/Device/Dorsal Slit Older Than 28 Days, Hand Surgery   NON-GU PSH: Cataract surgery, Bilateral     GU PMH: BPH w/LUTS - 07/21/2019 Encounter for Prostate Cancer screening - 07/21/2019 Gross hematuria - 07/21/2019 Weak Urinary Stream - 07/21/2019 Phimosis, Phimosis - 2014    NON-GU PMH: Cardiac murmur, unspecified, Murmurs - 2014 Personal history of other diseases of the circulatory system, History of hypertension - 2014, History of cardiac disorder, - 2014 Personal history of other diseases of the digestive system, History of esophageal reflux - 2014 Personal history of other  endocrine, nutritional and metabolic disease, History of hypercholesterolemia - 2014 Unspecified atrial fibrillation, Atrial Fibrillation - 2014 Arrhythmia Atrial Fibrillation Encounter for general adult medical examination without abnormal findings, Encounter for preventive health examination GERD Heart disease, unspecified Hypercholesterolemia Hypertension Sleep Apnea    FAMILY HISTORY: 2 daughters - Other Asthma - Father Death In The Family Father - Runs In Family Death In The Family Mother - Runs In Family Diabetes - Mother nephrolithiasis - Brother   SOCIAL HISTORY: Marital Status: Married Preferred Language: English; Race: White Current Smoking Status: Patient does not smoke anymore. Has not smoked since 07/16/1989.   Tobacco Use Assessment Completed: Used Tobacco in last 30 days? Drinks 4+ caffeinated drinks per day.     Notes: Former smoker, Marital History - Currently Married, Caffeine Use, Being A Social Drinker   REVIEW OF SYSTEMS:    GU Review Male:   Patient denies get up at night to urinate, penile pain, leakage of urine, stream starts and stops, have to strain to urinate , erection problems, frequent urination, burning/ pain with urination, hard to postpone urination, and trouble starting your stream.  Gastrointestinal (Upper):   Patient denies nausea, vomiting, and indigestion/ heartburn.  Gastrointestinal (Lower):   Patient denies diarrhea and constipation.  Constitutional:   Patient denies fever, night sweats, weight loss, and fatigue.  Skin:   Patient denies skin rash/ lesion and itching.  Eyes:   Patient denies blurred vision and double vision.  Ears/ Nose/ Throat:   Patient denies sore throat and sinus problems.  Hematologic/Lymphatic:   Patient denies swollen glands and easy bruising.  Cardiovascular:   Patient denies leg swelling and chest pains.  Respiratory:   Patient denies cough and shortness of breath.  Endocrine:   Patient denies excessive thirst.   Musculoskeletal:   Patient denies back pain and joint pain.  Neurological:   Patient denies headaches and dizziness.  Psychologic:   Patient denies depression and anxiety.   VITAL SIGNS:      08/15/2019 08:11 AM  BP 154/77 mmHg  Heart Rate 71 /min  Temperature 97.0 F / 36.1 C   PAST DATA REVIEWED:  Source Of History:  Patient  Lab Test Review:   PSA  Urine Test Review:   Urinalysis  X-Ray Review: C.T. Abdomen/Pelvis: Reviewed Films. Reviewed Report. Discussed With Patient.     07/21/19  PSA  Total PSA 3.50 ng/mL    PROCEDURES:         KUB - 74018  A single view of the abdomen is obtained.  Persistent left ureteral calculus      Patient confirmed No Neulasta OnPro Device.           Urinalysis Dipstick Dipstick Cont'd  Color: Yellow Bilirubin: Neg mg/dL  Appearance: Clear Ketones: Neg mg/dL  Specific Gravity: 1.020 Blood: Neg ery/uL  pH: 6.0 Protein: Neg mg/dL  Glucose: Neg mg/dL Urobilinogen: 0.2 mg/dL    Nitrites: Neg    Leukocyte Esterase: Neg leu/uL    ASSESSMENT:      ICD-10 Details  1 GU:   Gross hematuria - R31.0 Self-Limited  2   Ureteral calculus - N20.1 Undiagnosed New Problem  3   Ureteral obstruction secondary to calculous - N13.2 Undiagnosed New Problem     PLAN:           Orders Labs Urine Culture  X-Rays: KUB          Schedule         Document Letter(s):  Created for Patient: Clinical Summary         Notes:   We discussed the management of urinary stones. These options include observation, ureteroscopy, and shockwave lithotripsy. We discussed which options are relevant to these particular stones. We discussed the natural history of stones as well as the complications of untreated stones and the impact on quality of life without treatment as well as with each of the above listed treatments. We also discussed the efficacy of each treatment in its ability to clear the stone burden. With any of these management options I discussed the signs  and symptoms of infection and the need for emergent treatment should these be experienced. For each option we discussed the ability of each procedure to clear the patient of their stone burden.   For observation I described the risks which include but are not limited to silent renal damage, life-threatening infection, need for emergent surgery, failure to pass stone, and pain.   For ureteroscopy I described the risks which include heart attack, stroke, pulmonary embolus, death, bleeding, infection, damage to contiguous structures, positioning injury, ureteral stricture, ureteral avulsion, ureteral injury, need for ureteral stent, inability to perform ureteroscopy, need for an interval procedure, inability to clear stone burden, stent discomfort and pain.   For shockwave lithotripsy I described the risks which include arrhythmia, kidney contusion, kidney hemorrhage, need for transfusion, pain, inability to break up stone, inability to pass stone fragments, Steinstrasse, infection associated with obstructing stones, need for different surgical procedure, need for repeat shockwave lithotripsy   He would  like to proceed with ureteroscopy.   Cc: Dr. Jonni Sanger    Signed by Link Snuffer, III, M.D. on 08/15/19 at 9:18 AM (EST

## 2019-09-11 NOTE — H&P (View-Only) (Signed)
CC/HPI: CC: Gross hematuria  HPI:  07/21/2019  74 year old male experienced gross hematuria about 3 weeks ago. He is on Eliquis for atrial fibrillation. He [place] and had some back pain. It resolved but when he went to void, he had a large amount of gross blood. Gross hematuria has resolved but he has persistent microscopic hematuria today. He also complains about a 45 degree curvature to the left of his penis. He is not bothered by this. It does not hinder sexual activity. He does not think he has had a recent PSA.   08/15/2019  Patient presents after undergoing a CT IVP. This revealed an obstructing left ureteral calculus. May have been present for some time given the degree of hydronephrosis and some parenchymal thinning. He has occasional lower left back pain but it is not severe. Urinalysis negative today. He presents today for cystoscopy to rule out other potential causes for hematuria as well. He has had stones in the past but has never required intervention. PSA was 3.5.     ALLERGIES: No Allergies    MEDICATIONS: Nexium 20 mg capsule,delayed release  Amlodipine Besylate 10 mg tablet  Atorvastatin Calcium 40 mg tablet  Benazepril Hcl 40 mg tablet  Eliquis 5 mg tablet     GU PSH: Locm 300-399Mg /Ml Iodine,1Ml - 08/01/2019 Non-Newborn Circumcision - 2013       [place] Notes: Circumcision No Clamp/Device/Dorsal Slit Older Than 28 Days, Hand Surgery   NON-GU PSH: Cataract surgery, Bilateral     GU PMH: BPH w/LUTS - 07/21/2019 Encounter for Prostate Cancer screening - 07/21/2019 Gross hematuria - 07/21/2019 Weak Urinary Stream - 07/21/2019 Phimosis, Phimosis - 2014    NON-GU PMH: Cardiac murmur, unspecified, Murmurs - 2014 Personal history of other diseases of the circulatory system, History of hypertension - 2014, History of cardiac disorder, - 2014 Personal history of other diseases of the digestive system, History of esophageal reflux - 2014 Personal history of other  endocrine, nutritional and metabolic disease, History of hypercholesterolemia - 2014 Unspecified atrial fibrillation, Atrial Fibrillation - 2014 Arrhythmia Atrial Fibrillation Encounter for general adult medical examination without abnormal findings, Encounter for preventive health examination GERD Heart disease, unspecified Hypercholesterolemia Hypertension Sleep Apnea    FAMILY HISTORY: 2 daughters - Other Asthma - Father Death In The Family Father - Runs In Family Death In The Family Mother - Runs In Family Diabetes - Mother nephrolithiasis - Brother   SOCIAL HISTORY: Marital Status: Married Preferred Language: English; Race: White Current Smoking Status: Patient does not smoke anymore. Has not smoked since 07/16/1989.   Tobacco Use Assessment Completed: Used Tobacco in last 30 days? Drinks 4+ caffeinated drinks per day.     Notes: Former smoker, Marital History - Currently Married, Caffeine Use, Being A Social Drinker   REVIEW OF SYSTEMS:    GU Review Male:   Patient denies get up at night to urinate, penile pain, leakage of urine, stream starts and stops, have to strain to urinate , erection problems, frequent urination, burning/ pain with urination, hard to postpone urination, and trouble starting your stream.  Gastrointestinal (Upper):   Patient denies nausea, vomiting, and indigestion/ heartburn.  Gastrointestinal (Lower):   Patient denies diarrhea and constipation.  Constitutional:   Patient denies fever, night sweats, weight loss, and fatigue.  Skin:   Patient denies skin rash/ lesion and itching.  Eyes:   Patient denies blurred vision and double vision.  Ears/ Nose/ Throat:   Patient denies sore throat and sinus problems.  Hematologic/Lymphatic:   Patient denies swollen glands and easy bruising.  Cardiovascular:   Patient denies leg swelling and chest pains.  Respiratory:   Patient denies cough and shortness of breath.  Endocrine:   Patient denies excessive thirst.   Musculoskeletal:   Patient denies back pain and joint pain.  Neurological:   Patient denies headaches and dizziness.  Psychologic:   Patient denies depression and anxiety.   VITAL SIGNS:      08/15/2019 08:11 AM  BP 154/77 mmHg  Heart Rate 71 /min  Temperature 97.0 F / 36.1 C   PAST DATA REVIEWED:  Source Of History:  Patient  Lab Test Review:   PSA  Urine Test Review:   Urinalysis  X-Ray Review: C.T. Abdomen/Pelvis: Reviewed Films. Reviewed Report. Discussed With Patient.     07/21/19  PSA  Total PSA 3.50 ng/mL    PROCEDURES:         KUB - 74018  A single view of the abdomen is obtained.  Persistent left ureteral calculus      Patient confirmed No Neulasta OnPro Device.           Urinalysis Dipstick Dipstick Cont'd  Color: Yellow Bilirubin: Neg mg/dL  Appearance: Clear Ketones: Neg mg/dL  Specific Gravity: 1.020 Blood: Neg ery/uL  pH: 6.0 Protein: Neg mg/dL  Glucose: Neg mg/dL Urobilinogen: 0.2 mg/dL    Nitrites: Neg    Leukocyte Esterase: Neg leu/uL    ASSESSMENT:      ICD-10 Details  1 GU:   Gross hematuria - R31.0 Self-Limited  2   Ureteral calculus - N20.1 Undiagnosed New Problem  3   Ureteral obstruction secondary to calculous - N13.2 Undiagnosed New Problem     PLAN:           Orders Labs Urine Culture  X-Rays: KUB          Schedule         Document Letter(s):  Created for Patient: Clinical Summary         Notes:   We discussed the management of urinary stones. These options include observation, ureteroscopy, and shockwave lithotripsy. We discussed which options are relevant to these particular stones. We discussed the natural history of stones as well as the complications of untreated stones and the impact on quality of life without treatment as well as with each of the above listed treatments. We also discussed the efficacy of each treatment in its ability to clear the stone burden. With any of these management options I discussed the signs  and symptoms of infection and the need for emergent treatment should these be experienced. For each option we discussed the ability of each procedure to clear the patient of their stone burden.   For observation I described the risks which include but are not limited to silent renal damage, life-threatening infection, need for emergent surgery, failure to pass stone, and pain.   For ureteroscopy I described the risks which include heart attack, stroke, pulmonary embolus, death, bleeding, infection, damage to contiguous structures, positioning injury, ureteral stricture, ureteral avulsion, ureteral injury, need for ureteral stent, inability to perform ureteroscopy, need for an interval procedure, inability to clear stone burden, stent discomfort and pain.   For shockwave lithotripsy I described the risks which include arrhythmia, kidney contusion, kidney hemorrhage, need for transfusion, pain, inability to break up stone, inability to pass stone fragments, Steinstrasse, infection associated with obstructing stones, need for different surgical procedure, need for repeat shockwave lithotripsy   He would  like to proceed with ureteroscopy.   Cc: Dr. Jonni Sanger    Signed by Link Snuffer, III, M.D. on 08/15/19 at 9:18 AM (EST

## 2019-09-12 DIAGNOSIS — Z7901 Long term (current) use of anticoagulants: Secondary | ICD-10-CM | POA: Diagnosis not present

## 2019-09-12 DIAGNOSIS — I10 Essential (primary) hypertension: Secondary | ICD-10-CM | POA: Diagnosis not present

## 2019-09-12 DIAGNOSIS — E7800 Pure hypercholesterolemia, unspecified: Secondary | ICD-10-CM | POA: Diagnosis not present

## 2019-09-12 DIAGNOSIS — I4891 Unspecified atrial fibrillation: Secondary | ICD-10-CM | POA: Diagnosis not present

## 2019-09-12 DIAGNOSIS — N132 Hydronephrosis with renal and ureteral calculous obstruction: Secondary | ICD-10-CM | POA: Diagnosis not present

## 2019-09-12 DIAGNOSIS — K219 Gastro-esophageal reflux disease without esophagitis: Secondary | ICD-10-CM | POA: Diagnosis not present

## 2019-09-12 DIAGNOSIS — N201 Calculus of ureter: Secondary | ICD-10-CM | POA: Diagnosis not present

## 2019-09-12 NOTE — Progress Notes (Signed)
Instructions were reviewed with patient. All questions were answered. Patient was transported to main entrance by wheelchair. ° °

## 2019-09-14 ENCOUNTER — Other Ambulatory Visit: Payer: Self-pay | Admitting: Urology

## 2019-09-14 NOTE — Discharge Summary (Signed)
Physician Discharge Summary  Patient ID: Samuel Willis MRN: [id number] DOB/AGE: 06-25-1945 74 y.o.  Admit date: 09/11/2019 Discharge date: 09/12/2019  Admission Diagnoses:  Discharge Diagnoses:  Active Problems:   Ureteral calculus   Discharged Condition: good  Hospital Course: underwent URS/LL/stent placement and had low HR and pain post-op. Remained under observation and pain controlled with improved HR the next day.  Consults: None  Significant Diagnostic Studies: none  Treatments: surgery: as above  Discharge Exam: Blood pressure 124/69, pulse (!) 56, temperature 98.1 F (36.7 C), temperature source Oral, resp. rate 16, height 6\' 1"  (1.854 m), weight 86.2 kg, SpO2 97 %. General appearance: alert , NAD abd soft, NTND   Disposition: Discharge disposition: 01-Home or Self Care        Allergies as of 09/12/2019   No Known Allergies     Medication List    TAKE these medications   amLODipine 10 MG tablet Commonly known as: NORVASC Take 1 tablet by mouth once daily What changed: when to take this   atorvastatin 40 MG tablet Commonly known as: LIPITOR Take 1 tablet by mouth once daily   benazepril 40 MG tablet Commonly known as: LOTENSIN Take 1 tablet (40 mg total) by mouth daily.   Eliquis 5 MG Tabs tablet Generic drug: apixaban Take 1 tablet by mouth twice daily What changed: how much to take   esomeprazole 20 MG packet Commonly known as: NEXIUM Take 20 mg by mouth daily.   metFORMIN 500 MG 24 hr tablet Commonly known as: GLUCOPHAGE-XR TAKE 2 TABLETS BY MOUTH ONCE DAILY WITH BREAKFAST   metoprolol succinate 25 MG 24 hr tablet Commonly known as: TOPROL-XL Take as needed for afib.   naproxen sodium 220 MG tablet Commonly known as: ALEVE Take 660 mg by mouth daily as needed (pain).        Signed: Marton Redwood, III 09/14/2019, 5:13 PM

## 2019-09-15 LAB — GLUCOSE, CAPILLARY: Glucose-Capillary: 118 mg/dL — ABNORMAL HIGH (ref 70–99)

## 2019-09-21 NOTE — Patient Instructions (Addendum)
DUE TO COVID-19 ONLY ONE VISITOR IS ALLOWED TO COME WITH YOU AND STAY IN THE WAITING ROOM ONLY DURING PRE OP AND PROCEDURE DAY OF SURGERY. THE 1 VISITOR MAY VISIT WITH YOU AFTER SURGERY IN YOUR PRIVATE ROOM DURING VISITING HOURS ONLY!                 Samuel Willis   Your procedure is scheduled on: 09/27/19   Report to [facility] Main  Entrance   Report to admitting at   6:30AM     Call this number if you have problems the morning of surgery [phone]    Remember: Do not eat food or drink liquids after Midnight.   BRUSH YOUR TEETH MORNING OF SURGERY AND RINSE YOUR MOUTH OUT, NO CHEWING GUM CANDY OR MINTS.     Take these medicines the morning of surgery with A SIP OF WATER: Metoprolol, Nexium.                                  You may not have any metal on your body including              piercings  Do not wear jewelry,  lotions, powders or  deodorant                      Men may shave face and neck.   Do not bring valuables to the hospital. [place].  Contacts, dentures or bridgework may not be worn into surgery.       Patients discharged the day of surgery will not be allowed to drive home.   IF YOU ARE HAVING SURGERY AND GOING HOME THE SAME DAY, YOU MUST HAVE AN ADULT TO DRIVE YOU HOME AND BE WITH YOU FOR 24 HOURS.   YOU MAY GO HOME BY TAXI OR UBER OR ORTHERWISE, BUT AN ADULT MUST ACCOMPANY YOU HOME AND STAY WITH YOU FOR 24 HOURS.  Name and phone number of your driver:  Special Instructions: N/A              Please read over the following fact sheets you were given: _____________________________________________________________________             [facility] - Preparing for Surgery Before surgery, you can play an important role   Because skin is not sterile, your skin needs to be as free of germs as possible.   You can reduce the number of germs on your skin by washing with CHG (chlorahexidine  gluconate) soap before surgery.   CHG is an antiseptic cleaner which kills germs and bonds with the skin to continue killing germs even after washing. Please DO NOT use if you have an allergy to CHG or antibacterial soaps.   If your skin becomes reddened/irritated stop using the CHG and inform your nurse when you arrive at Short Stay.  You may shave your face/neck.  Please follow these instructions carefully:  1.  Shower with CHG Soap the night before surgery and the  morning of Surgery.  2.  If you choose to wash your hair, wash your hair first as usual with your  normal  shampoo.  3.  After you shampoo, rinse your hair and body thoroughly to remove the  shampoo.  4.  Use CHG as you would any other liquid soap.  You can apply chg directly  to the skin and wash                       Gently with a scrungie or clean washcloth.  5.  Apply the CHG Soap to your body ONLY FROM THE NECK DOWN.   Do not use on face/ open                           Wound or open sores. Avoid contact with eyes, ears mouth and genitals (private parts).                       Wash face,  Genitals (private parts) with your normal soap.             6.  Wash thoroughly, paying special attention to the area where your surgery  will be performed.  7.  Thoroughly rinse your body with warm water from the neck down.  8.  DO NOT shower/wash with your normal soap after using and rinsing off  the CHG Soap.             9.  Pat yourself dry with a clean towel.            10.  Wear clean pajamas.            11.  Place clean sheets on your bed the night of your first shower and do not  sleep with pets. Day of Surgery : Do not apply any lotions/deodorants the morning of surgery.  Please wear clean clothes to the hospital/surgery center.  FAILURE TO FOLLOW THESE INSTRUCTIONS MAY RESULT IN THE CANCELLATION OF YOUR SURGERY PATIENT SIGNATURE_________________________________  NURSE  SIGNATURE__________________________________  ________________________________________________________________________

## 2019-09-22 ENCOUNTER — Encounter (HOSPITAL_COMMUNITY)
Admission: RE | Admit: 2019-09-22 | Discharge: 2019-09-22 | Disposition: A | Discharge status: Home / Self Care | Payer: Medicare Other | Source: Ambulatory Visit | Attending: Urology | Admitting: Urology

## 2019-09-22 ENCOUNTER — Other Ambulatory Visit: Payer: Self-pay

## 2019-09-22 ENCOUNTER — Encounter (HOSPITAL_COMMUNITY): Payer: Self-pay

## 2019-09-22 DIAGNOSIS — Z01812 Encounter for preprocedural laboratory examination: Secondary | ICD-10-CM | POA: Insufficient documentation

## 2019-09-22 NOTE — Progress Notes (Signed)
PCP -  Dr. Zack Seal Cardiologist - Dr. [name] and Dr. [name]  Chest x-ray - no EKG - 04/27/19 Stress Test -  ECHO - 04/23/20 Cardiac Cath - 2012  Sleep Study - yes CPAP -no   Fasting Blood Sugar - Pt dosen't test Checks Blood Sugar _____ times a day  Blood Thinner Instructions:Eliquis Aspirin Instructions:stop 48 hours prior to surgery Last Dose:4/11  Anesthesia review:   Patient denies shortness of breath, fever, cough and chest pain at PAT appointment yes  Patient verbalized understanding of instructions that were given to them at the PAT appointment. Patient was also instructed that they will need to review over the PAT instructions again at home before surgery. yes

## 2019-09-23 ENCOUNTER — Other Ambulatory Visit (HOSPITAL_COMMUNITY): Payer: Medicare Other

## 2019-09-25 ENCOUNTER — Other Ambulatory Visit: Payer: Self-pay

## 2019-09-25 ENCOUNTER — Encounter (HOSPITAL_COMMUNITY)
Admission: RE | Admit: 2019-09-25 | Discharge: 2019-09-25 | Disposition: A | Discharge status: Home / Self Care | Payer: Medicare Other | Source: Ambulatory Visit | Attending: Urology | Admitting: Urology

## 2019-09-25 DIAGNOSIS — Z01812 Encounter for preprocedural laboratory examination: Secondary | ICD-10-CM | POA: Diagnosis not present

## 2019-09-25 LAB — BASIC METABOLIC PANEL
Anion gap: 6 (ref 5–15)
BUN: 18 mg/dL (ref 8–23)
CO2: 28 mmol/L (ref 22–32)
Calcium: 9.4 mg/dL (ref 8.9–10.3)
Chloride: 106 mmol/L (ref 98–111)
Creatinine, Ser: 1.04 mg/dL (ref 0.61–1.24)
GFR calc Af Amer: >60 mL/min (ref >60)
GFR calc non Af Amer: >60 mL/min (ref >60)
Glucose, Bld: 116 mg/dL — ABNORMAL HIGH (ref 70–99)
Potassium: 5.3 mmol/L — ABNORMAL HIGH (ref 3.5–5.1)
Sodium: 140 mmol/L (ref 135–145)

## 2019-09-25 LAB — CBC
HCT: 39.3 % (ref 39.0–52.0)
Hemoglobin: 13 g/dL (ref 13.0–17.0)
MCH: 32.3 pg (ref 26.0–34.0)
MCHC: 33.1 g/dL (ref 30.0–36.0)
MCV: 97.5 fL (ref 80.0–100.0)
Platelets: 228 10*3/uL (ref 150–400)
RBC: 4.03 MIL/uL — ABNORMAL LOW (ref 4.22–5.81)
RDW: 13.3 % (ref 11.5–15.5)
WBC: 5 10*3/uL (ref 4.0–10.5)
nRBC: 0 % (ref 0.0–0.2)

## 2019-09-26 ENCOUNTER — Encounter (HOSPITAL_COMMUNITY): Payer: Self-pay | Admitting: Urology

## 2019-09-26 LAB — HEMOGLOBIN A1C
Hgb A1c MFr Bld: 5.9 % — ABNORMAL HIGH (ref 4.8–5.6)
Mean Plasma Glucose: 123 mg/dL

## 2019-09-26 NOTE — Anesthesia Preprocedure Evaluation (Addendum)
Anesthesia Evaluation  Patient identified by MRN, date of birth, ID band Patient awake    Reviewed: Allergy & Precautions, NPO status , Patient's Chart, lab work & pertinent test results, reviewed documented beta blocker date and time   Airway Mallampati: II  TM Distance: >3 FB Neck ROM: Full    Dental no notable dental hx. (+) Teeth Intact   Pulmonary sleep apnea , former smoker,  covid + 16mos. ago   Pulmonary exam normal breath sounds clear to auscultation       Cardiovascular hypertension, Pt. on medications and Pt. on home beta blockers + CAD  Normal cardiovascular exam+ dysrhythmias Atrial Fibrillation + Valvular Problems/Murmurs AI and AS  Rhythm:Regular Rate:Normal  Bicuspid Aortic valve   Neuro/Psych PSYCHIATRIC DISORDERS    GI/Hepatic Neg liver ROS, GERD  Medicated and Controlled,  Endo/Other  diabetes, Well Controlled, Type 2, Oral Hypoglycemic AgentsHyperlipidemia  Renal/GU Renal InsufficiencyRenal diseaseLeft ureteral calculus   Peyronies disease    Musculoskeletal negative musculoskeletal ROS (+)   Abdominal   Peds  Hematology Eliquis therapy- last dose 09/01/19   Anesthesia Other Findings   Reproductive/Obstetrics                           Anesthesia Physical Anesthesia Plan  ASA: III  Anesthesia Plan: General   Post-op Pain Management:    Induction: Intravenous  PONV Risk Score and Plan: 4 or greater and Ondansetron, Treatment may vary due to age or medical condition and Dexamethasone  Airway Management Planned: LMA  Additional Equipment:   Intra-op Plan:   Post-operative Plan: Extubation in OR  Informed Consent: I have reviewed the patients History and Physical, chart, labs and discussed the procedure including the risks, benefits and alternatives for the proposed anesthesia with the patient or authorized representative who has indicated his/her understanding and  acceptance.     Dental advisory given  Plan Discussed with: CRNA and Surgeon  Anesthesia Plan Comments:        Anesthesia Quick Evaluation

## 2019-09-27 ENCOUNTER — Encounter (HOSPITAL_COMMUNITY): Payer: Self-pay | Admitting: Urology

## 2019-09-27 ENCOUNTER — Ambulatory Visit (HOSPITAL_COMMUNITY)
Admission: RE | Admit: 2019-09-27 | Discharge: 2019-09-27 | Disposition: A | Discharge status: Home / Self Care | Payer: Medicare Other | Attending: Urology | Admitting: Urology

## 2019-09-27 ENCOUNTER — Ambulatory Visit (HOSPITAL_COMMUNITY): Payer: Medicare Other | Admitting: Physician Assistant

## 2019-09-27 ENCOUNTER — Encounter (HOSPITAL_COMMUNITY)
Admission: RE | Disposition: A | Discharge status: Home / Self Care | Payer: Self-pay | Source: Home / Self Care | Attending: Urology

## 2019-09-27 ENCOUNTER — Ambulatory Visit (HOSPITAL_COMMUNITY): Payer: Medicare Other | Admitting: Anesthesiology

## 2019-09-27 ENCOUNTER — Ambulatory Visit (HOSPITAL_COMMUNITY): Payer: Medicare Other

## 2019-09-27 DIAGNOSIS — I2510 Atherosclerotic heart disease of native coronary artery without angina pectoris: Secondary | ICD-10-CM | POA: Insufficient documentation

## 2019-09-27 DIAGNOSIS — Z79899 Other long term (current) drug therapy: Secondary | ICD-10-CM | POA: Diagnosis not present

## 2019-09-27 DIAGNOSIS — E119 Type 2 diabetes mellitus without complications: Secondary | ICD-10-CM | POA: Diagnosis not present

## 2019-09-27 DIAGNOSIS — I4891 Unspecified atrial fibrillation: Secondary | ICD-10-CM | POA: Insufficient documentation

## 2019-09-27 DIAGNOSIS — G4730 Sleep apnea, unspecified: Secondary | ICD-10-CM | POA: Diagnosis not present

## 2019-09-27 DIAGNOSIS — I480 Paroxysmal atrial fibrillation: Secondary | ICD-10-CM | POA: Diagnosis not present

## 2019-09-27 DIAGNOSIS — E785 Hyperlipidemia, unspecified: Secondary | ICD-10-CM | POA: Insufficient documentation

## 2019-09-27 DIAGNOSIS — Z466 Encounter for fitting and adjustment of urinary device: Secondary | ICD-10-CM | POA: Diagnosis not present

## 2019-09-27 DIAGNOSIS — E7800 Pure hypercholesterolemia, unspecified: Secondary | ICD-10-CM | POA: Diagnosis not present

## 2019-09-27 DIAGNOSIS — I10 Essential (primary) hypertension: Secondary | ICD-10-CM | POA: Diagnosis not present

## 2019-09-27 DIAGNOSIS — Z7901 Long term (current) use of anticoagulants: Secondary | ICD-10-CM | POA: Diagnosis not present

## 2019-09-27 DIAGNOSIS — K219 Gastro-esophageal reflux disease without esophagitis: Secondary | ICD-10-CM | POA: Insufficient documentation

## 2019-09-27 DIAGNOSIS — N201 Calculus of ureter: Secondary | ICD-10-CM | POA: Diagnosis not present

## 2019-09-27 DIAGNOSIS — Z87891 Personal history of nicotine dependence: Secondary | ICD-10-CM | POA: Diagnosis not present

## 2019-09-27 DIAGNOSIS — Z7984 Long term (current) use of oral hypoglycemic drugs: Secondary | ICD-10-CM | POA: Diagnosis not present

## 2019-09-27 DIAGNOSIS — N132 Hydronephrosis with renal and ureteral calculous obstruction: Secondary | ICD-10-CM | POA: Insufficient documentation

## 2019-09-27 DIAGNOSIS — Z8616 Personal history of COVID-19: Secondary | ICD-10-CM | POA: Diagnosis not present

## 2019-09-27 HISTORY — PX: CYSTOSCOPY/URETEROSCOPY/HOLMIUM LASER/STENT PLACEMENT: SHX6546

## 2019-09-27 SURGERY — CYSTOSCOPY/URETEROSCOPY/HOLMIUM LASER/STENT PLACEMENT
Anesthesia: General | Site: Urethra | Laterality: Left

## 2019-09-27 MED ORDER — PROPOFOL 10 MG/ML IV BOLUS
INTRAVENOUS | Status: DC | PRN
  Administered 2019-09-27: 180 mg via INTRAVENOUS

## 2019-09-27 MED ORDER — IOHEXOL 300 MG/ML  SOLN
INTRAMUSCULAR | Status: DC | PRN
  Administered 2019-09-27: 10 mL via URETHRAL

## 2019-09-27 MED ORDER — PROPOFOL 10 MG/ML IV BOLUS
INTRAVENOUS | Status: AC
  Filled 2019-09-27: qty 20

## 2019-09-27 MED ORDER — ONDANSETRON HCL 4 MG/2ML IJ SOLN
INTRAMUSCULAR | Status: AC
  Filled 2019-09-27: qty 2

## 2019-09-27 MED ORDER — PHENYLEPHRINE 40 MCG/ML (10ML) SYRINGE FOR IV PUSH (FOR BLOOD PRESSURE SUPPORT)
PREFILLED_SYRINGE | INTRAVENOUS | Status: DC | PRN
  Administered 2019-09-27 (×3): 80 ug via INTRAVENOUS

## 2019-09-27 MED ORDER — HYDROCODONE-ACETAMINOPHEN 5-325 MG PO TABS: 1.0000 | ORAL_TABLET | ORAL | Status: DC | PRN

## 2019-09-27 MED ORDER — LIDOCAINE 2% (20 MG/ML) 5 ML SYRINGE
INTRAMUSCULAR | Status: AC
  Filled 2019-09-27: qty 5

## 2019-09-27 MED ORDER — LIDOCAINE 2% (20 MG/ML) 5 ML SYRINGE
INTRAMUSCULAR | Status: DC | PRN
  Administered 2019-09-27: 80 mg via INTRAVENOUS

## 2019-09-27 MED ORDER — FENTANYL CITRATE (PF) 100 MCG/2ML IJ SOLN
INTRAMUSCULAR | Status: AC
  Filled 2019-09-27: qty 2

## 2019-09-27 MED ORDER — DEXAMETHASONE SODIUM PHOSPHATE 10 MG/ML IJ SOLN
INTRAMUSCULAR | Status: AC
  Filled 2019-09-27: qty 1

## 2019-09-27 MED ORDER — PHENYLEPHRINE 40 MCG/ML (10ML) SYRINGE FOR IV PUSH (FOR BLOOD PRESSURE SUPPORT)
PREFILLED_SYRINGE | INTRAVENOUS | Status: AC
  Filled 2019-09-27: qty 10

## 2019-09-27 MED ORDER — FENTANYL CITRATE (PF) 100 MCG/2ML IJ SOLN
INTRAMUSCULAR | Status: DC | PRN
  Administered 2019-09-27: 25 ug via INTRAVENOUS
  Administered 2019-09-27: 50 ug via INTRAVENOUS
  Administered 2019-09-27: 25 ug via INTRAVENOUS

## 2019-09-27 MED ORDER — ONDANSETRON HCL 4 MG/2ML IJ SOLN
INTRAMUSCULAR | Status: DC | PRN
  Administered 2019-09-27: 4 mg via INTRAVENOUS

## 2019-09-27 MED ORDER — SODIUM CHLORIDE 0.9 % IR SOLN
Status: DC | PRN
  Administered 2019-09-27: 3000 mL via INTRAVESICAL

## 2019-09-27 MED ORDER — DEXAMETHASONE SODIUM PHOSPHATE 10 MG/ML IJ SOLN
INTRAMUSCULAR | Status: DC | PRN
  Administered 2019-09-27: 10 mg via INTRAVENOUS

## 2019-09-27 MED ORDER — FENTANYL CITRATE (PF) 100 MCG/2ML IJ SOLN: 25.0000 ug | INTRAMUSCULAR | Status: DC | PRN

## 2019-09-27 MED ORDER — ONDANSETRON HCL 4 MG/2ML IJ SOLN: 4.0000 mg | Freq: Once | INTRAMUSCULAR | Status: DC | PRN

## 2019-09-27 MED ORDER — CEFAZOLIN SODIUM-DEXTROSE 2-4 GM/100ML-% IV SOLN
2.0000 g | INTRAVENOUS | Status: AC
  Administered 2019-09-27: 09:00:00 2 g via INTRAVENOUS
  Filled 2019-09-27: qty 100

## 2019-09-27 MED ORDER — HYDROCODONE-ACETAMINOPHEN 5-325 MG PO TABS
ORAL_TABLET | ORAL | Status: AC
  Administered 2019-09-27: 11:00:00 1 via ORAL
  Filled 2019-09-27: qty 1

## 2019-09-27 MED ORDER — LACTATED RINGERS IV SOLN: INTRAVENOUS | Status: DC

## 2019-09-27 MED ORDER — 0.9 % SODIUM CHLORIDE (POUR BTL) OPTIME
TOPICAL | Status: DC | PRN
  Administered 2019-09-27: 1000 mL

## 2019-09-27 SURGICAL SUPPLY — 19 items
BAG URO CATCHER STRL LF (MISCELLANEOUS) ×2 IMPLANT
BASKET LASER NITINOL 1.9FR (BASKET) ×2 IMPLANT
BASKET ZERO TIP NITINOL 2.4FR (BASKET) IMPLANT
CATH INTERMIT  6FR 70CM (CATHETERS) ×2 IMPLANT
CLOTH BEACON ORANGE TIMEOUT ST (SAFETY) ×2 IMPLANT
EXTRACTOR STONE 1.7FRX115CM (UROLOGICAL SUPPLIES) IMPLANT
FIBER LASER FLEXIVA 365 (UROLOGICAL SUPPLIES) IMPLANT
FIBER LASER TRAC TIP (UROLOGICAL SUPPLIES) IMPLANT
GLOVE BIO SURGEON STRL SZ7.5 (GLOVE) ×2 IMPLANT
GOWN STRL REUS W/TWL XL LVL3 (GOWN DISPOSABLE) ×2 IMPLANT
GUIDEWIRE ANG ZIPWIRE 038X150 (WIRE) IMPLANT
GUIDEWIRE STR DUAL SENSOR (WIRE) ×4 IMPLANT
KIT TURNOVER KIT A (KITS) IMPLANT
MANIFOLD NEPTUNE II (INSTRUMENTS) ×2 IMPLANT
PACK CYSTO (CUSTOM PROCEDURE TRAY) ×2 IMPLANT
SHEATH URETERAL 12FRX28CM (UROLOGICAL SUPPLIES) IMPLANT
SHEATH URETERAL 12FRX35CM (MISCELLANEOUS) ×4 IMPLANT
TUBING CONNECTING 10 (TUBING) ×2 IMPLANT
TUBING UROLOGY SET (TUBING) ×2 IMPLANT

## 2019-09-27 NOTE — Interval H&P Note (Signed)
History and Physical Interval Note:  09/27/2019 8:38 AM  Samuel Willis  has presented today for surgery, with the diagnosis of LEFT RENAL STONE.  The various methods of treatment have been discussed with the patient and family. After consideration of risks, benefits and other options for treatment, the patient has consented to  Procedure(s): CYSTOSCOPY/URETEROSCOPY/HOLMIUM LASER/STENT PLACEMENT (Left) as a surgical intervention.  The patient's history has been reviewed, patient examined, no change in status, stable for surgery.  I have reviewed the patient's chart and labs.  Questions were answered to the patient's satisfaction.     Marton Redwood, III

## 2019-09-27 NOTE — Transfer of Care (Signed)
Immediate Anesthesia Transfer of Care Note  Patient: Samuel Willis  Procedure(s) Performed: CYSTOSCOPY/URETEROSCOPY/STONE MANIPULATION/STENT PLACEMENT (Left Urethra)  Patient Location: PACU  Anesthesia Type:General  Level of Consciousness: awake and alert   Airway & Oxygen Therapy: Patient Spontanous Breathing and Patient connected to face mask oxygen  Post-op Assessment: Report given to RN and Post -op Vital signs reviewed and stable  Post vital signs: Reviewed and stable  Last Vitals:  Vitals Value Taken Time  BP 137/72 09/27/19 0945  Temp 36.3 C 09/27/19 0945  Pulse 54 09/27/19 0945  Resp 10 09/27/19 0945  SpO2 100 % 09/27/19 0945  Vitals shown include unvalidated device data.  Last Pain:  Vitals:   09/27/19 0720  TempSrc:   PainSc: 0-No pain         Complications: No apparent anesthesia complications

## 2019-09-27 NOTE — Op Note (Signed)
Operative Note  Preoperative diagnosis:  1.  Left ureteral calculus  Postoperative diagnosis: 1.  Left ureteral calculus  Procedure(s): 1.  Cystoscopy with left retrograde pyelogram, left ureteroscopy with stone extraction, ureteral stent exchange  Surgeon: Link Snuffer, MD  Assistants: None  Anesthesia: General  Complications: None immediate  EBL: Minimal  Specimens: 1.  None  Drains/Catheters: 1.  6 x 26 double-J ureteral stent  Intraoperative findings: Multiple stone fragments within the ureter which were basket extracted.  A couple of Randall's plaques within the kidney but no loose stones.  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  He had residual fragments that were not safe to go after due to an occluded ureter that I unoccluded with the laser.  After sufficient time for the ureter to heal around the stent, he presents for the previously mentioned operation  Description of procedure:  The patient was identified and consent was obtained.  The patient was taken to the operating room and placed in the supine position.  The patient was placed under general anesthesia.  Perioperative antibiotics were administered.  The patient was placed in dorsal lithotomy..  Patient was prepped and draped in a standard sterile fashion and a timeout was performed.  *A 21 French rigid cystoscope was advanced into the urethra and into the bladder.  Complete cystoscopy was performed with no abnormal findings.  I withdrew the stent to the meatus and advanced a wire through the stent up to the kidney under fluoroscopic guidance.  I removed the stent.  I advanced an ureteral access sheath over the wire and up the ureter under continuous fluoroscopic guidance.  I withdrew the inner portion of the sheath and advanced a second wire through the sheath.  I withdrew the sheath and secured one  the wires to the drape as a safety wire and readvanced the sheath over the other wire under continuous fluoroscopic guidance up the ureter.  I removed the inner sheath along with the wire.  I performed flexible ureteroscopy multiple small stone fragments were encountered which were basket extracted.  I then advanced the scope into the kidney and performed a complete pyeloscopy.  There was quite a bit of dilation of the kidney itself.  However, aside from some Randall's plaques, there was no significant stone that I could see.  I shot a retrograde pyelogram through the scope which revealed severe hydronephrosis.  I withdrew the scope along with the access sheath and visualize the entire ureter upon removal.  The ureter was patent and there was no obvious trauma to the ureter and no other ureteral calculi that were obvious.  I backloaded the wire onto the rigid cystoscope and advanced that into the bladder followed by routine placement of a 6 x 26 double-J ureteral stent.  Fluoroscopy confirmed proximal placement and direct visualization confirmed a good coil within the bladder.  I drained the bladder and withdrew the scope.  Patient tolerated procedure well and was stable postoperatively.   Plan: Return in 1 week for stent removal

## 2019-09-27 NOTE — Discharge Instructions (Signed)

## 2019-09-27 NOTE — Anesthesia Procedure Notes (Signed)
Procedure Name: LMA Insertion Date/Time: 09/27/2019 8:48 AM Performed by: Sharlette Dense, CRNA Patient Re-evaluated:Patient Re-evaluated prior to induction Oxygen Delivery Method: Circle system utilized Preoxygenation: Pre-oxygenation with 100% oxygen Induction Type: IV induction Ventilation: Mask ventilation without difficulty LMA: LMA inserted LMA Size: 4.0 Number of attempts: 1 Placement Confirmation: positive ETCO2 and breath sounds checked- equal and bilateral Tube secured with: Tape Dental Injury: Teeth and Oropharynx as per pre-operative assessment

## 2019-09-27 NOTE — Anesthesia Postprocedure Evaluation (Signed)
Anesthesia Post Note  Patient: Samuel Willis  Procedure(s) Performed: CYSTOSCOPY/URETEROSCOPY/STONE MANIPULATION/STENT PLACEMENT (Left Urethra)     Patient location during evaluation: PACU Anesthesia Type: General Level of consciousness: awake and alert and oriented Pain management: pain level controlled Vital Signs Assessment: post-procedure vital signs reviewed and stable Respiratory status: spontaneous breathing, nonlabored ventilation and respiratory function stable Cardiovascular status: blood pressure returned to baseline and stable Postop Assessment: no apparent nausea or vomiting Anesthetic complications: no    Last Vitals:  Vitals:   09/27/19 0945 09/27/19 1000  BP: 137/72 (!) 141/80  Pulse: (!) 55 (!) 57  Resp: 12   Temp: (!) 36.3 C (!) 36.3 C  SpO2: 100% 100%    Last Pain:  Vitals:   09/27/19 1000  TempSrc:   PainSc: 0-No pain                 [name] A.

## 2019-09-27 NOTE — Addendum Note (Signed)
Addendum  created 09/27/19 1018 by Josephine Igo, MD   Order list changed

## 2019-10-04 ENCOUNTER — Other Ambulatory Visit (HOSPITAL_COMMUNITY): Payer: Self-pay | Admitting: *Deleted

## 2019-10-04 MED ORDER — BENAZEPRIL HCL 20 MG PO TABS: 20.0000 mg | ORAL_TABLET | Freq: Every day | ORAL | 0 refills | Status: DC

## 2019-10-06 DIAGNOSIS — N5201 Erectile dysfunction due to arterial insufficiency: Secondary | ICD-10-CM | POA: Diagnosis not present

## 2019-10-06 DIAGNOSIS — N201 Calculus of ureter: Secondary | ICD-10-CM | POA: Diagnosis not present

## 2019-10-08 ENCOUNTER — Other Ambulatory Visit (HOSPITAL_COMMUNITY): Payer: Self-pay | Admitting: Cardiology

## 2019-10-09 ENCOUNTER — Other Ambulatory Visit (HOSPITAL_COMMUNITY): Payer: Self-pay

## 2019-10-17 ENCOUNTER — Other Ambulatory Visit: Payer: Self-pay

## 2019-10-17 ENCOUNTER — Encounter (HOSPITAL_COMMUNITY): Payer: Self-pay | Admitting: Cardiology

## 2019-10-17 ENCOUNTER — Ambulatory Visit (HOSPITAL_COMMUNITY)
Admission: RE | Admit: 2019-10-17 | Discharge: 2019-10-17 | Disposition: A | Discharge status: Home / Self Care | Payer: Medicare Other | Source: Ambulatory Visit | Attending: Cardiology | Admitting: Cardiology

## 2019-10-17 VITALS — BP 140/80 | HR 70 | Wt 195.2 lb

## 2019-10-17 DIAGNOSIS — I480 Paroxysmal atrial fibrillation: Secondary | ICD-10-CM | POA: Diagnosis not present

## 2019-10-17 DIAGNOSIS — N189 Chronic kidney disease, unspecified: Secondary | ICD-10-CM | POA: Diagnosis not present

## 2019-10-17 DIAGNOSIS — Q231 Congenital insufficiency of aortic valve: Secondary | ICD-10-CM | POA: Diagnosis not present

## 2019-10-17 DIAGNOSIS — Z823 Family history of stroke: Secondary | ICD-10-CM | POA: Insufficient documentation

## 2019-10-17 DIAGNOSIS — I77810 Thoracic aortic ectasia: Secondary | ICD-10-CM | POA: Diagnosis not present

## 2019-10-17 DIAGNOSIS — G4733 Obstructive sleep apnea (adult) (pediatric): Secondary | ICD-10-CM | POA: Diagnosis not present

## 2019-10-17 DIAGNOSIS — Z8249 Family history of ischemic heart disease and other diseases of the circulatory system: Secondary | ICD-10-CM | POA: Insufficient documentation

## 2019-10-17 DIAGNOSIS — Z7984 Long term (current) use of oral hypoglycemic drugs: Secondary | ICD-10-CM | POA: Insufficient documentation

## 2019-10-17 DIAGNOSIS — I350 Nonrheumatic aortic (valve) stenosis: Secondary | ICD-10-CM | POA: Diagnosis not present

## 2019-10-17 DIAGNOSIS — I2510 Atherosclerotic heart disease of native coronary artery without angina pectoris: Secondary | ICD-10-CM | POA: Insufficient documentation

## 2019-10-17 DIAGNOSIS — E785 Hyperlipidemia, unspecified: Secondary | ICD-10-CM | POA: Diagnosis not present

## 2019-10-17 DIAGNOSIS — I129 Hypertensive chronic kidney disease with stage 1 through stage 4 chronic kidney disease, or unspecified chronic kidney disease: Secondary | ICD-10-CM | POA: Insufficient documentation

## 2019-10-17 DIAGNOSIS — Z79899 Other long term (current) drug therapy: Secondary | ICD-10-CM | POA: Insufficient documentation

## 2019-10-17 DIAGNOSIS — Z7901 Long term (current) use of anticoagulants: Secondary | ICD-10-CM | POA: Diagnosis not present

## 2019-10-17 DIAGNOSIS — E1122 Type 2 diabetes mellitus with diabetic chronic kidney disease: Secondary | ICD-10-CM | POA: Diagnosis not present

## 2019-10-17 NOTE — Progress Notes (Signed)
Patient ID: Samuel Willis, male   DOB: 12-25-1945, 74 y.o.   MRN: [id number] PCP: Dr. Anitra Lauth Cardiology: Dr. [name]  74 y.o. with history of bicuspid aortic valve and mild-moderate AS/mild AI on last echo in 6/18 as well as paroxysmal atrial fibrillation presents for followup. He had a Watchman device placed in 5/17. Cardiolite in 8/18 showed no evidence for ischemia/infarction.   He had recurrent atrial fibrillation in 9/19, went back into NSR spontaneously after 4-5 days.  He started apixaban in case he needed DCCV and has stayed on it (had not been willing to take in the past).   Echo (10/19) showed EF 60-65%, mild AS, mild AI with bicuspid aortic valve.   LHC was done in 10/19 showing nonobstructive mild coronary disease.   Echo in 11/20 showed EF 60-65%, moderate LVH, mild AS with bicuspid aortic valve.   Sleep study in 11/20 showed severe OSA, he has not wanted to use CPAP.   Patient returns for followup of aortic valve disease and atrial fibrillation.  In 4/21, was found to have nephrolithiasis in setting of hematuria and had ureteral stent + stone extraction.  Weight is down 9 lbs since last appointment. He is feeling good overall currently. No exertional dyspnea.  No chest pain.  He has had no palpitations.  In NSR today.  Stays active.   ECG (personally reviewed): NSR, normal  Labs (12/10): creatinine 1.4  Labs (6/12): K 4.5, creatinine 1.2, LDL 66, HDL 48 Labs (7/14): K 4.5, creatinine 1.3, LDL 71, HDL 49 Labs (1/15): K 4.8, creatinine 1.5, LDL 58, HDL 36 Labs (2/16): K 4.5, creatinine 1.17, LDL 83, HDL 42, TSH normal Labs (3/17): LDL 72, HDL 44 Labs (5/17): K 4.1, creatinine 1.2 Labs (4/18): LDL 92, HDL 37 Labs (8/18): K 5.1, creatinine 1.27, LDL 55, HDL 42 Labs (8/19): LDL 69, HDL 41 Labs (9/19): TSH normal, K 4.7, creatinine 1.22 Labs (9/20): LDL 84 Labs (11/20): K 4.3, creatinine 1.07 Labs (9/21): K 5.3, creatinine 1.04  Allergies (verified):  No Known Drug  Allergies   Past Medical History:  1.  Atrial fibrillation: Paroxysmal. Patient has a brief episode of irregular heart beating every 1-2 years. One was apparently documented as atrial fibrillation years ago in [employer]. His CHADSVASC score is 3 now that CAD has been found. LINQ monitor was placed and has documented paroxysmal atrial fibrillation. - S/p Watchman placement in 5/17.  2. Hypertension.  3. Hyperlipidemia.  4. CAD: Exercise treadmill Myoview done in January 2010. The patient exercised for 10 minutes and 46 seconds. He stopped due to fatigue. EF was 56%. There was normal perfusion with no evidence for ischemia or infarction. LHC (1/12): 60-70% mLAD stenosis.  ETT-myoview was then done to assess for ischemia in the LAD territory. Patient exercised 10', EF was 69% with no evidence for ischemia or infarction.   - ETT-Cardiolite (8/18): EF 56%, 10 minutes exercise, no evidence for ischemia/infarction (normal).  - LHC (10/19): Nonobstructive mild CAD.  5. Aortic stenosis and aortic regurgitation (bicuspid aortic valve). The patient did have an echocardiogram done on June 26, 2008. EF was 65%. There were no regional wall motion abnormalities. There was moderate aortic regurgitation. There was mild aortic stenosis by mean gradient which was 12 mmHg. There was mild ascending aorta and aortic root dilation. There was mild mitral regurgitation, mild left atrial enlargement, and right atrial enlargement. TEE was done, confirming a functionally bicuspid aortic valve with fusion of the right and noncoronary cusps. There  was mild aortic stenosis and moderate aortic insufficiency. The ascending aorta was mildly dilated at 3.9 cm. TTE (1/10) showed EF 60% with mild LVH, mild AS (mean gradient 12), mild to moderate AR, normal LV size. TTE (1/12) with EF 60%, mild LVH, moderate diastolic dysfunction, mild AS (mean gradient 11 mmHg), mild AI, normal RV. MRA chest (1/12) with 3.7 cm aortic root and ascending  aorta. TTE (2/13): EF 60-65%, mild LVH, mild AS, mild AI, mild MR.  Echo (2/15) with EF 60-65%, bicuspid aortic valve with mild aortic stenosis and mild to moderate AI, mild MR.  MRA chest (2/15) with 4.1 cm aortic root and 4.1 cm ascending aorta.  Echo (4/16) with EF 60-65%, mild AS mean gradient 16 mmHg.   - TEE (7/17) with EF 60-65%, bicuspid aortic valve with mild to moderate AS, moderate AI.  - Echo (6/18): EF 60-65%, moderate LVH, bicuspid aortic valve with mild to moderate AS (mean gradient 12, AVA 1.75 cm^2), mild AI, ascending aorta 4.1 cm.  - MRA chest (6/18): Ascending aorta 3.8 cm - Echo (10/19): EF 60-65%, mild LVH, mild AS, mild AI, bicuspid aortic valve.  - Echo (11/20): EF 60-65%, moderate LVH, normal RV size and systolic function, bicuspid aortic valve with mean gradient 14 mmHg and AVA 2 cm^2.  6. Biceps tendon rupture.  7. CKD 8. Ascending aortic aneurysm: TEE (2/17) with 4.0 cm ascending aorta.  9. Type II diabetes 10. OSA: Sleep study in 11/20 with severe OSA.  Family History:  The patient's mother has a history of stroke, also has questionable history of atrial fibrillation. The patient's father had an MI in his 53s.   Social History:  The patient is married, lives in [place], is a nonsmoker. He owns several businesses. He is not drinking alcohol.   Review of Systems  All systems reviewed and negative except as per HPI.   Current Outpatient Medications  Medication Sig Dispense Refill  . amLODipine (NORVASC) 10 MG tablet Take 1 tablet by mouth once daily (Patient taking differently: Take 10 mg by mouth every evening. ) 90 tablet 0  . atorvastatin (LIPITOR) 40 MG tablet Take 1 tablet by mouth once daily (Patient taking differently: Take 40 mg by mouth daily. ) 90 tablet 0  . benazepril (LOTENSIN) 20 MG tablet Take 1 tablet by mouth once daily 90 tablet 1  . ELIQUIS 5 MG TABS tablet Take 1 tablet by mouth twice daily 180 tablet 1  . esomeprazole (NEXIUM) 20 MG capsule  Take 20 mg by mouth daily at 12 noon.    . metFORMIN (GLUCOPHAGE-XR) 500 MG 24 hr tablet TAKE 2 TABLETS BY MOUTH ONCE DAILY WITH BREAKFAST 180 tablet 0  . metoprolol succinate (TOPROL-XL) 25 MG 24 hr tablet Take as needed for afib. (Patient taking differently: Take 25 mg by mouth daily as needed (afib). ) 30 tablet 0  . naproxen sodium (ALEVE) 220 MG tablet Take 660 mg by mouth daily as needed (pain).     No current facility-administered medications for this encounter.    BP 140/80   Pulse 70   Wt 88.5 kg (195 lb 3.2 oz)   SpO2 97%   BMI 27.22 kg/m  General: NAD Neck: No JVD, no thyromegaly or thyroid nodule.  Lungs: Clear to auscultation bilaterally with normal respiratory effort. CV: Nondisplaced PMI.  Heart regular S1/S2, no S3/S4, 2/6 early SEM RUSB.  No peripheral edema.  No carotid bruit.  Normal pedal pulses.  Abdomen: Soft, nontender, no hepatosplenomegaly, no  distention.  Skin: Intact without lesions or rashes.  Neurologic: Alert and oriented x 3.  Psych: Normal affect. Extremities: No clubbing or cyanosis.  HEENT: Normal.   Assessment/Plan: 1. Bicuspid aortic valve disorder: Mild AS on 11/20 echo. Minimal dilation of ascending aorta on 6/18 MRA (3.8 cm).   - Repeat echo at followup appt in 6-12 months.    2. CAD: Moderate nonobstructive disease on 2012 cath and again on cath in 10/19.   - Continue statin, lipids in 9/20 were ok.   - He is not on ASA as he is taking apixaban.   3. Hyperlipidemia: Good lipids in 9/20, check lipids again at followup.     4. HTN: SBP 120s-130s when he checks at home (brings readings).  Better since he has lost weight. Think we can continue current meds.   - Continue amlodipine and benazepril.    - He did not tolerate HCTZ.  - OSA likely plays a role in HTN but he has not wanted to use CPAP.   5. Atrial fibrillation: Paroxysmal atrial fibrillation seen by [facility] monitor.  He is symptomatic when in atrial fibrillation, but episodes are short and  infrequent.    - He has a Multimedia programmer.  - Continue apixaban.  - Atrial fibrillation is not frequent enough to take a rhythm control path at this point.  - He has Toprol XL just for prn use (if he feels atrial fibrillation).  6. OSA: Severe on 11/20 sleep study. Treatment could help lower BP and also decrease risk for recurrent atrial fibrillation. However, he really does not want to use CPAP. We talked about dental appliance.  He thinks that raising the head of his bed helps, so he will do this for now.  If he wants to try CPAP, he can call office and I will arrange.  - I will arrange for home sleep study.   Followup 6 months with echo.    [name] 10/17/2019

## 2019-10-17 NOTE — Patient Instructions (Signed)
No medication changes today!   Your physician wants you to follow-up in: 6 months You will receive a reminder letter in the mail two months in advance. If you don't receive a letter, please call our office to schedule the follow-up appointment.  Your physician has requested that you have an echocardiogram. Echocardiography is a painless test that uses sound waves to create images of your heart. It provides your doctor with information about the size and shape of your heart and how well your heart's chambers and valves are working. This procedure takes approximately one hour. There are no restrictions for this procedure.   Please call office at [phone] option 2 if you have any questions or concerns.    At the [facility], you and your health needs are our priority. As part of our continuing mission to provide you with exceptional heart care, we have created designated Provider Care Teams. These Care Teams include your primary Cardiologist (physician) and Advanced Practice Providers (APPs- Physician Assistants and Nurse Practitioners) who all work together to provide you with the care you need, when you need it.   You may see any of the following providers on your designated Care Team at your next follow up: Marland Kitchen Dr Glori Bickers . Dr [name] . Darrick Grinder, NP . Lyda Jester, PA . Audry Riles, PharmD   Please be sure to bring in all your medications bottles to every appointment.

## 2019-10-31 ENCOUNTER — Ambulatory Visit (INDEPENDENT_AMBULATORY_CARE_PROVIDER_SITE_OTHER): Payer: Medicare Other | Admitting: Family Medicine

## 2019-10-31 ENCOUNTER — Other Ambulatory Visit: Payer: Self-pay

## 2019-10-31 ENCOUNTER — Encounter: Payer: Self-pay | Admitting: Family Medicine

## 2019-10-31 VITALS — BP 130/80 | HR 62 | Temp 97.5°F | Ht 71.0 in | Wt 196.0 lb

## 2019-10-31 DIAGNOSIS — R059 Cough, unspecified: Secondary | ICD-10-CM

## 2019-10-31 DIAGNOSIS — I10 Essential (primary) hypertension: Secondary | ICD-10-CM

## 2019-10-31 DIAGNOSIS — E783 Hyperchylomicronemia: Secondary | ICD-10-CM | POA: Diagnosis not present

## 2019-10-31 DIAGNOSIS — K219 Gastro-esophageal reflux disease without esophagitis: Secondary | ICD-10-CM | POA: Diagnosis not present

## 2019-10-31 DIAGNOSIS — E1159 Type 2 diabetes mellitus with other circulatory complications: Secondary | ICD-10-CM | POA: Diagnosis not present

## 2019-10-31 DIAGNOSIS — R05 Cough: Secondary | ICD-10-CM

## 2019-10-31 DIAGNOSIS — I480 Paroxysmal atrial fibrillation: Secondary | ICD-10-CM | POA: Diagnosis not present

## 2019-10-31 DIAGNOSIS — E1169 Type 2 diabetes mellitus with other specified complication: Secondary | ICD-10-CM | POA: Diagnosis not present

## 2019-10-31 LAB — SARS-COV-2 IGG: SARS-COV-2 IgG: 3.46

## 2019-10-31 MED ORDER — ESOMEPRAZOLE MAGNESIUM 20 MG PO CPDR: 20.0000 mg | DELAYED_RELEASE_CAPSULE | Freq: Every day | ORAL | 3 refills | Status: DC

## 2019-10-31 NOTE — Assessment & Plan Note (Signed)
A1c 5.9 last month.  Continue Metformin 1000 mg daily.

## 2019-10-31 NOTE — Progress Notes (Signed)
Samuel Willis is a 74 y.o. male who presents today for an office visit.  He is transferring care.   Assessment/Plan:  Chronic Problems Addressed Today: Hypertension associated with diabetes ([place]) At goal.  Continue Norvasc 10 mg daily and benazepril 20 mg daily.  Hyperlipidemia, controlled with Lipitor, LDL goal < 70 Stable.  Continue Lipitor 40 mg daily.  Paroxysmal atrial fibrillation (HCC) Regular rate and rhythm today.  Continue anticoagulation with Eliquis and rate control with metoprolol as needed.  Type 2 diabetes mellitus with other specified complication ([place]), controlled with Metformin A1c 5.9 last month.  Continue Metformin 1000 mg daily.  GERD (gastroesophageal reflux disease) Refilled Nexium.  Cough No current symptoms.  Had positive Covid test couple months ago.  Will check antibody test today per patient request.    Subjective:  HPI:  His stable, chronic medical conditions are outlined below:    # T2DM -On Metformin 1000 mg once daily tolerating well  #Essential hypertension - On amlodipine 10 mg daily, benazepril 20 mg daily, and metoprolol succinate 25 mg daily  # Afib - Follows with cardiology - Dr [name] -Anticoagulated on Eliquis and rate controlled with metoprolol as needed.  # Dyslipidemia -On atorvastatin 40 mg daily.  # GERD with Barrett's - Follows with GI - On nexium 20mg  daily  PMH:  The following were reviewed and entered/updated in epic: Past Medical History:  Diagnosis Date  . Aortic aneurysm, thoracic ([place]) 11/18/2016   bicuspid aortic valve. aortic stenosis, aortic regurg Ectatic ascending aorta measured 3.8 cm diameter, stable. patient denies  . Aortic insufficiency    Mild  . Aortic stenosis    Mild-Moderate  . Asbestos exposure CT 06/2013   Noncalcified pleural plaques bilat; no signs of malignancy.  . Bicuspid aortic valve    aortic stenosis, aortic regurgitation; Dr. [name] plans to get an echo and MRA chest 12/2016   . CAD (coronary artery disease)    LAD dz but no corresponding ischemia on myoview  . Chronic renal insufficiency, stage III (moderate) 06/2013   CrCl about 50 ml/min  . Confusion summer 2017   Eval by Dr. Erlinda Hong, neurology---suspected dx is impaired concentration/focus, but EEG being done to further r/o seizures.  Marland Kitchen COVID-19   . Eustachian tube dysfunction   . Fatty liver 04/2012; 06/2013   Noted on noncontrast abd CT done during trauma w/u when tractor rolled onto his knee.  Also noted on noncontrast chest CT done to screen for asbestos lung damage. pt denies  . GERD (gastroesophageal reflux disease)   . Hearing loss   . Heart murmur    mild  . Hematuria   . Hyperlipidemia   . Hypertension   . LVH (left ventricular hypertrophy)    Moderate  . Nephrolithiasis   . OSA (obstructive sleep apnea)    Mild no CPAP ordered  . Paroxysmal atrial fibrillation (HCC)    ASA 325 + plavix as of 01/06/16;  electrophys did placement of Watchman left atrial appendage occlusive device 10/2015.  Marland Kitchen Peyronie's disease   . Pre-diabetes   . Prediabetes 2015   A1c 6.4%.  A1c 6.1% 08/2015   Patient Active Problem List   Diagnosis Date Noted  . Ureteral calculus 09/11/2019  . Peyronie's disease 06/30/2019  . Current use of long term anticoagulation 06/30/2019  . Sensorineural hearing loss (SNHL), bilateral, followed by Dr. Janace Hoard 04/06/2018  . Eustachian tube dysfunction, bilateral -- R TM with tympanostamy tube 04/05/2018  . Bicuspid aortic valve   .  Aortic insufficiency   . Aortic stenosis   . Ascending aortic aneurysm ([place])   . Paroxysmal atrial fibrillation ([place]) 10/31/2015  . Type 2 diabetes mellitus with other specified complication ([place]), controlled with Metformin 06/26/2013  . Asbestos exposure 06/26/2013  . CAD (coronary artery disease) 07/26/2011  . Barrett's esophagus, last EGD 01/2018, 3 cm, no dysplasia, repeat 3-5 years 02/23/2011  . GERD (gastroesophageal reflux disease) 01/06/2011  .  Esophageal dysphagia 11/30/2010  . Aneurysm of thoracic aorta ([place]) 03/20/2010  . Hyperlipidemia, controlled with Lipitor, LDL goal < 70 07/16/2008  . Hypertension associated with diabetes ([place]) 07/16/2008   Past Surgical History:  Procedure Laterality Date  . CARDIAC CATHETERIZATION  06/24/10  . cataract Left 06/22/2019  . CIRCUMCISION  01/18/2012   Procedure: CIRCUMCISION ADULT;  Surgeon: [name], MD;  Location: [facility];  Service: Urology;  Laterality: N/A;  30 mins requested for this case   . COLONOSCOPY    . CORONARY ANGIOGRAPHY N/A 04/13/2018   Procedure: CORONARY ANGIOGRAPHY (CATH LAB);  Surgeon: [name], MD;  Location: [facility];  Service: Cardiovascular;  Laterality: N/A;  . CYSTOSCOPY/URETEROSCOPY/HOLMIUM LASER/STENT PLACEMENT Left 09/11/2019   Procedure: CYSTOSCOPY/URETEROSCOPY/HOLMIUM LASER/STENT PLACEMENT;  Surgeon: [name], MD;  Location: [facility];  Service: Urology;  Laterality: Left;  . CYSTOSCOPY/URETEROSCOPY/HOLMIUM LASER/STENT PLACEMENT Left 09/27/2019   Procedure: CYSTOSCOPY/URETEROSCOPY/STONE MANIPULATION/STENT PLACEMENT;  Surgeon: [name], MD;  Location: [facility];  Service: Urology;  Laterality: Left;  . EEG  01/2016   NORMAL ([place] [facility])  . ESOPHAGOGASTRODUODENOSCOPY  02/23/2011   Barrett's esophagus, hiatal hernia - dilated for dysphagia  . Implantable loop recorder placement  06/29/14   MDT LINQ implanted by Dr [name] in the office as part of the RIO II protocol. + A fib confirmation.  Marland Kitchen LEFT ATRIAL APPENDAGE OCCLUSION N/A 10/31/2015   Procedure: LEFT ATRIAL APPENDAGE OCCLUSION;  Surgeon: [name], MD;  Location: [facility];  Service: Cardiovascular;  Laterality: N/A;  . LEFT ATRIAL APPENDAGE OCCLUSION    . MYRINGOTOMY Right 04/06/2018  . SHOULDER SURGERY Right    Arthroscopic 02/23/12  . TEE WITHOUT CARDIOVERSION N/A 07/25/2015   Dr. [name]: EF 60-65%, normal LV wall motion, functionally  bicuspid aortic valve with mild AI and mild AS, ascending aorta dilated to 4 cm, left atrial appendage measured for Watchman procedure/device  . TEE WITHOUT CARDIOVERSION N/A 12/27/2015   Procedure: TRANSESOPHAGEAL ECHOCARDIOGRAM (TEE);  Surgeon: Dorothy Spark, MD;  Location: [facility];  Service: Cardiovascular;  Laterality: N/A;  . TENDON REPAIR  11/06/2011   Procedure: TENDON REPAIR;  Surgeon: Cammie Sickle., MD;  Location: [facility];  Service: Orthopedics;  Laterality: Left;  explore/repair tendons left hand   . WATCHMAN PLACEMENT  10/31/15   Left atrial appendage occlusive device     Family History  Problem Relation Age of Onset  . Arthritis Mother   . Diabetes Mother   . Stroke Mother        In her 75s  . Heart attack Father 38  . Asthma Father   . Emphysema Father   . Heart disease Father   . Hypertension Father   . Hypertension Sister   . Hypertension Brother   . Hyperlipidemia Neg Hx   . Stomach cancer Neg Hx   . Rectal cancer Neg Hx   . Colon cancer Neg Hx   . Colon polyps Neg Hx   . Esophageal cancer Neg Hx  Medications- reviewed and updated Current Outpatient Medications  Medication Sig Dispense Refill  . amLODipine (NORVASC) 10 MG tablet Take 1 tablet by mouth once daily (Patient taking differently: Take 10 mg by mouth every evening. ) 90 tablet 0  . atorvastatin (LIPITOR) 40 MG tablet Take 1 tablet by mouth once daily (Patient taking differently: Take 40 mg by mouth daily. ) 90 tablet 0  . benazepril (LOTENSIN) 20 MG tablet Take 1 tablet by mouth once daily 90 tablet 1  . ELIQUIS 5 MG TABS tablet Take 1 tablet by mouth twice daily 180 tablet 1  . esomeprazole (NEXIUM) 20 MG capsule Take 1 capsule (20 mg total) by mouth daily at 12 noon. 90 capsule 3  . metFORMIN (GLUCOPHAGE-XR) 500 MG 24 hr tablet TAKE 2 TABLETS BY MOUTH ONCE DAILY WITH BREAKFAST 180 tablet 0  . metoprolol succinate (TOPROL-XL) 25 MG 24 hr tablet Take as needed for afib.  (Patient taking differently: Take 25 mg by mouth daily as needed (afib). ) 30 tablet 0  . naproxen sodium (ALEVE) 220 MG tablet Take 660 mg by mouth daily as needed (pain).     No current facility-administered medications for this visit.    Allergies-reviewed and updated No Known Allergies  Social History   Socioeconomic History  . Marital status: Married    Spouse name: Not on file  . Number of children: 2  . Years of education: Not on file  . Highest education level: Not on file  Occupational History  . Occupation: Self employed    Employer: SELF EMPLOYED  Tobacco Use  . Smoking status: Former Smoker    Packs/day: 0.50    Years: 10.00    Pack years: 5.00    Types: Cigarettes    Quit date: 11/03/1993    Years since quitting: 26.0  . Smokeless tobacco: Never Used  Substance and Sexual Activity  . Alcohol use: No  . Drug use: No  . Sexual activity: Yes  Other Topics Concern  . Not on file  Social History Narrative   Married, 2 daughters.   Orig from [place].   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   No T/A/Ds.          Social Determinants of Health   Financial Resource Strain:   . Difficulty of Paying Living Expenses:   Food Insecurity:   . Worried About Charity fundraiser in the Last Year:   . Arboriculturist in the Last Year:   Transportation Needs:   . Film/video editor (Medical):   Marland Kitchen Lack of Transportation (Non-Medical):   Physical Activity:   . Days of Exercise per Week:   . Minutes of Exercise per Session:   Stress:   . Feeling of Stress :   Social Connections:   . Frequency of Communication with Friends and Family:   . Frequency of Social Gatherings with Friends and Family:   . Attends Religious Services:   . Active Member of Clubs or Organizations:   . Attends Archivist Meetings:   Marland Kitchen Marital Status:           Objective:  Physical Exam: BP 130/80 (BP Location: Left  Arm, Patient Position: Sitting, Cuff Size: Normal)   Pulse 62   Temp (!) 97.5 F (36.4 C) (Temporal)   Ht 5\' 11"  (1.803 m)   Wt 196 lb (88.9 kg)   SpO2 98%   BMI 27.34  kg/m   Gen: No acute distress, resting comfortably CV: Regular rate and rhythm with no murmurs appreciated Pulm: Normal work of breathing, clear to auscultation bilaterally with no crackles, wheezes, or rhonchi Neuro: Grossly normal, moves all extremities Psych: Normal affect and thought content  Time Spent: 45 minutes of total time was spent on the date of the encounter performing the following actions: chart review prior to seeing the patient, obtaining history, performing a medically necessary exam, counseling on the treatment plan, placing orders, and documenting in our EHR.        Algis Greenhouse. Jerline Pain, MD 10/31/2019 9:08 AM

## 2019-10-31 NOTE — Assessment & Plan Note (Signed)
Refilled Nexium 

## 2019-10-31 NOTE — Assessment & Plan Note (Signed)
Regular rate and rhythm today.  Continue anticoagulation with Eliquis and rate control with metoprolol as needed.

## 2019-10-31 NOTE — Assessment & Plan Note (Signed)
Stable. Continue Lipitor 40 mg daily.

## 2019-10-31 NOTE — Patient Instructions (Signed)
It was very nice to see you today!  Keep doing what you are doing.  We will check antibody test today.  Come back in 6 months, or sooner if needed  Take care, Dr Jerline Pain  Please try these tips to maintain a healthy lifestyle:   Eat at least 3 REAL meals and 1-2 snacks per day.  Aim for no more than 5 hours between eating.  If you eat breakfast, please do so within one hour of getting up.    Each meal should contain half fruits/vegetables, one quarter protein, and one quarter carbs (no bigger than a computer mouse)   Cut down on sweet beverages. This includes juice, soda, and sweet tea.     Drink at least 1 glass of water with each meal and aim for at least 8 glasses per day   Exercise at least 150 minutes every week.

## 2019-10-31 NOTE — Assessment & Plan Note (Signed)
At goal.  Continue Norvasc 10 mg daily and benazepril 20 mg daily.

## 2019-11-01 NOTE — Progress Notes (Signed)
Please inform patient of the following:  COVID antibody is positive - this means that he has been exposed to the virus.  Samuel Willis. Jerline Pain, MD 11/01/2019 8:00 AM

## 2019-12-15 ENCOUNTER — Other Ambulatory Visit (HOSPITAL_COMMUNITY): Payer: Self-pay | Admitting: Urology

## 2019-12-15 ENCOUNTER — Other Ambulatory Visit: Payer: Self-pay | Admitting: Urology

## 2019-12-15 DIAGNOSIS — N130 Hydronephrosis with ureteropelvic junction obstruction: Secondary | ICD-10-CM

## 2019-12-22 ENCOUNTER — Telehealth: Payer: Self-pay | Admitting: *Deleted

## 2019-12-22 NOTE — Telephone Encounter (Signed)
Approved. This drug has been approved under the Member's Medicare Part D benefit. Approved quantity: 90 units per 90 day(s). You may fill up to a 90 day supply except for those on Specialty Tier 5, which can be filled up to a 30 day supply. Pharmacy notified

## 2019-12-22 NOTE — Telephone Encounter (Signed)
PA # [id number] Rx Esomeprazole Dr 20 mg Cap was send today 12/22/2019 Waiting for approval

## 2019-12-27 ENCOUNTER — Telehealth: Payer: Self-pay | Admitting: Family Medicine

## 2019-12-27 NOTE — Telephone Encounter (Signed)
Left message for patient to call back and schedule Medicare Annual Wellness Visit (AWV) either virtually/audio only OR in office. Whatever the patients preference is.  Last AWV 08/26/15; please schedule at anytime with [facility]Nurse Health Advisor at [facility].  This should be a 45 minute visit.

## 2019-12-30 ENCOUNTER — Other Ambulatory Visit: Payer: Self-pay | Admitting: Family Medicine

## 2019-12-30 DIAGNOSIS — E785 Hyperlipidemia, unspecified: Secondary | ICD-10-CM

## 2020-01-01 NOTE — Telephone Encounter (Signed)
Please review

## 2020-01-02 ENCOUNTER — Ambulatory Visit (HOSPITAL_COMMUNITY)
Admission: RE | Admit: 2020-01-02 | Discharge: 2020-01-02 | Disposition: A | Discharge status: Home / Self Care | Payer: Medicare Other | Source: Ambulatory Visit | Attending: Urology | Admitting: Urology

## 2020-01-02 ENCOUNTER — Other Ambulatory Visit: Payer: Self-pay

## 2020-01-02 DIAGNOSIS — N130 Hydronephrosis with ureteropelvic junction obstruction: Secondary | ICD-10-CM

## 2020-01-02 DIAGNOSIS — N132 Hydronephrosis with renal and ureteral calculous obstruction: Secondary | ICD-10-CM | POA: Diagnosis not present

## 2020-01-02 MED ORDER — FUROSEMIDE 10 MG/ML IJ SOLN: 43.0000 mg | Freq: Once | INTRAMUSCULAR | Status: AC

## 2020-01-02 MED ORDER — TECHNETIUM TC 99M MERTIATIDE: 4.9800 | Freq: Once | INTRAVENOUS | Status: DC | PRN

## 2020-01-02 MED ORDER — FUROSEMIDE 10 MG/ML IJ SOLN
INTRAMUSCULAR | Status: AC
  Administered 2020-01-02: 43 mg via INTRAVENOUS
  Filled 2020-01-02: qty 8

## 2020-01-19 DIAGNOSIS — N200 Calculus of kidney: Secondary | ICD-10-CM | POA: Diagnosis not present

## 2020-01-19 DIAGNOSIS — N130 Hydronephrosis with ureteropelvic junction obstruction: Secondary | ICD-10-CM | POA: Diagnosis not present

## 2020-01-23 ENCOUNTER — Telehealth: Payer: Self-pay | Admitting: Family Medicine

## 2020-01-23 NOTE — Progress Notes (Signed)
°  Chronic Care Management   Outreach Note  01/23/2020 Name: Samuel Willis MRN: [id number] DOB: 08-30-1945  Referred by: [name], MD Reason for referral : No chief complaint on file.   An unsuccessful telephone outreach was attempted today. The patient was referred to the pharmacist for assistance with care management and care coordination.   Follow Up Plan:   Earney Hamburg Upstream Scheduler

## 2020-01-24 ENCOUNTER — Telehealth: Payer: Self-pay | Admitting: Family Medicine

## 2020-01-24 NOTE — Progress Notes (Signed)
  Chronic Care Management   Note  01/24/2020 Name: Samuel Willis MRN: [id number] DOB: 01-27-1946  Samuel Willis is a 74 y.o. year old male who is a primary care patient of [name], MD. I reached out to [name] by phone today in response to a referral sent by Samuel Willis PCP, [name], MD.   Samuel Willis was given information about Chronic Care Management services today including:  1. CCM service includes personalized support from designated clinical staff supervised by his physician, including individualized plan of care and coordination with other care providers 2. 24/7 contact phone numbers for assistance for urgent and routine care needs. 3. Service will only be billed when office clinical staff spend 20 minutes or more in a month to coordinate care. 4. Only one practitioner may furnish and bill the service in a calendar month. 5. The patient may stop CCM services at any time (effective at the end of the month) by phone call to the office staff.   Patient wishes to consider information provided and/or speak with a member of the care team before deciding about enrollment in care management services.   Follow up plan:   Earney Hamburg Upstream Scheduler

## 2020-01-24 NOTE — Progress Notes (Signed)
  Chronic Care Management   Outreach Note  01/24/2020 Name: Samuel Willis MRN: [id number] DOB: 1945/12/18  Referred by: [name], MD Reason for referral : No chief complaint on file.   An unsuccessful telephone outreach was attempted today. The patient was referred to the pharmacist for assistance with care management and care coordination.   Follow Up Plan:   Earney Hamburg Upstream Scheduler

## 2020-02-05 ENCOUNTER — Telehealth: Payer: Self-pay | Admitting: Family Medicine

## 2020-02-05 NOTE — Progress Notes (Signed)
°  Chronic Care Management   Outreach Note  02/05/2020 Name: Samuel Willis MRN: [id number] DOB: 10/13/1945  Referred by: [name], MD Reason for referral : No chief complaint on file.   An unsuccessful telephone outreach was attempted today. The patient was referred to the pharmacist for assistance with care management and care coordination.   Follow Up Plan:   Earney Hamburg Upstream Scheduler

## 2020-02-07 ENCOUNTER — Telehealth: Payer: Self-pay | Admitting: Family Medicine

## 2020-02-07 NOTE — Progress Notes (Signed)
°  Chronic Care Management   Outreach Note  02/07/2020 Name: Samuel Willis MRN: [id number] DOB: Jun 17, 1945  Referred by: [name], MD Reason for referral : No chief complaint on file.   An unsuccessful telephone outreach was attempted today. The patient was referred to the pharmacist for assistance with care management and care coordination.   Follow Up Plan:   Earney Hamburg Upstream Scheduler

## 2020-02-11 ENCOUNTER — Other Ambulatory Visit: Payer: Self-pay | Admitting: Family Medicine

## 2020-02-14 ENCOUNTER — Telehealth: Payer: Self-pay | Admitting: Family Medicine

## 2020-02-14 NOTE — Progress Notes (Signed)
°  Chronic Care Management   Outreach Note  02/14/2020 Name: TEGH FRANEK MRN: [id number] DOB: [birth year]  Referred by: [name], MD Reason for referral : No chief complaint on file.   An unsuccessful telephone outreach was attempted today. The patient was referred to the pharmacist for assistance with care management and care coordination.   Follow Up Plan:   Earney Hamburg Upstream Scheduler

## 2020-03-01 ENCOUNTER — Other Ambulatory Visit: Payer: Self-pay | Admitting: Family Medicine

## 2020-03-06 ENCOUNTER — Telehealth: Payer: Self-pay

## 2020-03-06 MED ORDER — METFORMIN HCL ER 500 MG PO TB24: ORAL_TABLET | ORAL | 2 refills | Status: DC

## 2020-03-06 NOTE — Telephone Encounter (Signed)
..   LAST APPOINTMENT DATE: 03/01/2020   NEXT APPOINTMENT DATE:@11 /29/2021  MEDICATION:metFORMIN (GLUCOPHAGE-XR) 500 MG 24 hr tablet    PHARMACY: [facility] [address], [place] - [address] *

## 2020-03-06 NOTE — Telephone Encounter (Signed)
Refill sent in for pt. 

## 2020-03-07 DIAGNOSIS — L821 Other seborrheic keratosis: Secondary | ICD-10-CM | POA: Diagnosis not present

## 2020-03-07 DIAGNOSIS — Z85828 Personal history of other malignant neoplasm of skin: Secondary | ICD-10-CM | POA: Diagnosis not present

## 2020-03-07 DIAGNOSIS — L814 Other melanin hyperpigmentation: Secondary | ICD-10-CM | POA: Diagnosis not present

## 2020-03-07 DIAGNOSIS — D225 Melanocytic nevi of trunk: Secondary | ICD-10-CM | POA: Diagnosis not present

## 2020-03-07 DIAGNOSIS — L570 Actinic keratosis: Secondary | ICD-10-CM | POA: Diagnosis not present

## 2020-03-07 DIAGNOSIS — D2271 Melanocytic nevi of right lower limb, including hip: Secondary | ICD-10-CM | POA: Diagnosis not present

## 2020-03-07 DIAGNOSIS — L820 Inflamed seborrheic keratosis: Secondary | ICD-10-CM | POA: Diagnosis not present

## 2020-03-07 DIAGNOSIS — D171 Benign lipomatous neoplasm of skin and subcutaneous tissue of trunk: Secondary | ICD-10-CM | POA: Diagnosis not present

## 2020-03-28 ENCOUNTER — Other Ambulatory Visit: Payer: Self-pay | Admitting: Family Medicine

## 2020-03-28 DIAGNOSIS — E785 Hyperlipidemia, unspecified: Secondary | ICD-10-CM

## 2020-04-01 ENCOUNTER — Other Ambulatory Visit: Payer: Self-pay | Admitting: Family Medicine

## 2020-04-01 ENCOUNTER — Other Ambulatory Visit: Payer: Self-pay | Admitting: *Deleted

## 2020-04-01 DIAGNOSIS — E785 Hyperlipidemia, unspecified: Secondary | ICD-10-CM

## 2020-04-01 MED ORDER — ATORVASTATIN CALCIUM 40 MG PO TABS: 40.0000 mg | ORAL_TABLET | Freq: Every day | ORAL | 0 refills | Status: DC

## 2020-04-23 ENCOUNTER — Ambulatory Visit (HOSPITAL_COMMUNITY)
Admission: RE | Admit: 2020-04-23 | Discharge: 2020-04-23 | Disposition: A | Discharge status: Home / Self Care | Payer: Medicare Other | Source: Ambulatory Visit | Attending: Family Medicine | Admitting: Family Medicine

## 2020-04-23 ENCOUNTER — Other Ambulatory Visit: Payer: Self-pay

## 2020-04-23 ENCOUNTER — Ambulatory Visit (HOSPITAL_BASED_OUTPATIENT_CLINIC_OR_DEPARTMENT_OTHER)
Admission: RE | Admit: 2020-04-23 | Discharge: 2020-04-23 | Disposition: A | Discharge status: Home / Self Care | Payer: Medicare Other | Source: Ambulatory Visit | Attending: Cardiology | Admitting: Cardiology

## 2020-04-23 ENCOUNTER — Encounter (HOSPITAL_COMMUNITY): Payer: Self-pay | Admitting: Cardiology

## 2020-04-23 VITALS — BP 142/82 | HR 57 | Wt 209.4 lb

## 2020-04-23 DIAGNOSIS — Z7984 Long term (current) use of oral hypoglycemic drugs: Secondary | ICD-10-CM | POA: Insufficient documentation

## 2020-04-23 DIAGNOSIS — N189 Chronic kidney disease, unspecified: Secondary | ICD-10-CM | POA: Diagnosis not present

## 2020-04-23 DIAGNOSIS — Z8249 Family history of ischemic heart disease and other diseases of the circulatory system: Secondary | ICD-10-CM | POA: Insufficient documentation

## 2020-04-23 DIAGNOSIS — I130 Hypertensive heart and chronic kidney disease with heart failure and stage 1 through stage 4 chronic kidney disease, or unspecified chronic kidney disease: Secondary | ICD-10-CM | POA: Diagnosis not present

## 2020-04-23 DIAGNOSIS — I509 Heart failure, unspecified: Secondary | ICD-10-CM | POA: Insufficient documentation

## 2020-04-23 DIAGNOSIS — Z79899 Other long term (current) drug therapy: Secondary | ICD-10-CM | POA: Insufficient documentation

## 2020-04-23 DIAGNOSIS — I351 Nonrheumatic aortic (valve) insufficiency: Secondary | ICD-10-CM | POA: Diagnosis not present

## 2020-04-23 DIAGNOSIS — G4733 Obstructive sleep apnea (adult) (pediatric): Secondary | ICD-10-CM | POA: Diagnosis not present

## 2020-04-23 DIAGNOSIS — I350 Nonrheumatic aortic (valve) stenosis: Secondary | ICD-10-CM

## 2020-04-23 DIAGNOSIS — Z7901 Long term (current) use of anticoagulants: Secondary | ICD-10-CM | POA: Diagnosis not present

## 2020-04-23 DIAGNOSIS — I2510 Atherosclerotic heart disease of native coronary artery without angina pectoris: Secondary | ICD-10-CM | POA: Insufficient documentation

## 2020-04-23 DIAGNOSIS — E785 Hyperlipidemia, unspecified: Secondary | ICD-10-CM | POA: Diagnosis not present

## 2020-04-23 DIAGNOSIS — I480 Paroxysmal atrial fibrillation: Secondary | ICD-10-CM

## 2020-04-23 DIAGNOSIS — Q231 Congenital insufficiency of aortic valve: Secondary | ICD-10-CM | POA: Insufficient documentation

## 2020-04-23 DIAGNOSIS — E1122 Type 2 diabetes mellitus with diabetic chronic kidney disease: Secondary | ICD-10-CM | POA: Diagnosis not present

## 2020-04-23 HISTORY — DX: Heart failure, unspecified: I50.9

## 2020-04-23 LAB — BASIC METABOLIC PANEL
Anion gap: 9 (ref 5–15)
BUN: 18 mg/dL (ref 8–23)
CO2: 24 mmol/L (ref 22–32)
Calcium: 9.4 mg/dL (ref 8.9–10.3)
Chloride: 108 mmol/L (ref 98–111)
Creatinine, Ser: 0.97 mg/dL (ref 0.61–1.24)
GFR, Estimated: >60 mL/min (ref >60)
Glucose, Bld: 115 mg/dL — ABNORMAL HIGH (ref 70–99)
Potassium: 4.5 mmol/L (ref 3.5–5.1)
Sodium: 141 mmol/L (ref 135–145)

## 2020-04-23 LAB — ECHOCARDIOGRAM COMPLETE
AR max vel: 1.66 cm2
AV Area VTI: 1.78 cm2
AV Area mean vel: 1.68 cm2
AV Mean grad: 19 mmHg
AV Peak grad: 27.2 mmHg
Ao pk vel: 2.61 m/s
Area-P 1/2: 3.17 cm2
S' Lateral: 2.9 cm

## 2020-04-23 LAB — LIPID PANEL
Cholesterol: 130 mg/dL (ref 0–200)
HDL: 52 mg/dL (ref >40)
LDL Cholesterol: 69 mg/dL (ref 0–99)
Total CHOL/HDL Ratio: 2.5 RATIO
Triglycerides: 43 mg/dL (ref <150)
VLDL: 9 mg/dL (ref 0–40)

## 2020-04-23 LAB — CBC
HCT: 44.6 % (ref 39.0–52.0)
Hemoglobin: 14.8 g/dL (ref 13.0–17.0)
MCH: 31 pg (ref 26.0–34.0)
MCHC: 33.2 g/dL (ref 30.0–36.0)
MCV: 93.3 fL (ref 80.0–100.0)
Platelets: 182 10*3/uL (ref 150–400)
RBC: 4.78 MIL/uL (ref 4.22–5.81)
RDW: 12.4 % (ref 11.5–15.5)
WBC: 4.5 10*3/uL (ref 4.0–10.5)
nRBC: 0 % (ref 0.0–0.2)

## 2020-04-23 NOTE — Progress Notes (Signed)
  Echocardiogram 2D Echocardiogram with 3D has been performed.  Samuel Willis M 04/23/2020, 9:53 AM

## 2020-04-23 NOTE — Progress Notes (Signed)
Patient ID: Samuel Willis, male   DOB: [birth year], 74 y.o.   MRN: [id number] PCP: Dr. Anitra Lauth Cardiology: Dr. [name]  74 y.o. with history of bicuspid aortic valve and mild-moderate AS/mild AI on last echo in 6/18 as well as paroxysmal atrial fibrillation presents for followup. He had a Watchman device placed in 5/17. Cardiolite in 8/18 showed no evidence for ischemia/infarction.   He had recurrent atrial fibrillation in 9/19, went back into NSR spontaneously after 4-5 days.  He started apixaban in case he needed DCCV and has stayed on it (had not been willing to take in the past).   Echo (10/19) showed EF 60-65%, mild AS, mild AI with bicuspid aortic valve.   LHC was done in 10/19 showing nonobstructive mild coronary disease.   Echo in 11/20 showed EF 60-65%, moderate LVH, mild AS with bicuspid aortic valve.   Sleep study in 11/20 showed severe OSA, he has not wanted to use CPAP.   Echo was done today and reviewed, EF 65-70% with mild LVH, normal RV, mild-moderate AS (no change).   Patient returns for followup of aortic valve disease and atrial fibrillation.  He has done well over the last year.  Weight is up about 14 lbs but has not been exercising as much.  He is playing golf 3 days/week but walking less.  No chest pain.  No significant exertional dyspnea unless he walks up a steep hill.  Going pheasant hunting soon.  BP mildly elevated.    ECG (personally reviewed): NSR, normal  Labs (12/10): creatinine 1.4  Labs (6/12): K 4.5, creatinine 1.2, LDL 66, HDL 48 Labs (7/14): K 4.5, creatinine 1.3, LDL 71, HDL 49 Labs (1/15): K 4.8, creatinine 1.5, LDL 58, HDL 36 Labs (2/16): K 4.5, creatinine 1.17, LDL 83, HDL 42, TSH normal Labs (3/17): LDL 72, HDL 44 Labs (5/17): K 4.1, creatinine 1.2 Labs (4/18): LDL 92, HDL 37 Labs (8/18): K 5.1, creatinine 1.27, LDL 55, HDL 42 Labs (8/19): LDL 69, HDL 41 Labs (9/19): TSH normal, K 4.7, creatinine 1.22 Labs (9/20): LDL 84 Labs (11/20): K 4.3,  creatinine 1.07 Labs (4/21): K 5.3, creatinine 1.04  Allergies (verified):  No Known Drug Allergies   Past Medical History:  1.  Atrial fibrillation: Paroxysmal. Patient has a brief episode of irregular heart beating every 1-2 years. One was apparently documented as atrial fibrillation years ago in [employer]. His CHADSVASC score is 3 now that CAD has been found. LINQ monitor was placed and has documented paroxysmal atrial fibrillation. - S/p Watchman placement in 5/17.  2. Hypertension.  3. Hyperlipidemia.  4. CAD: Exercise treadmill Myoview done in January 2010. The patient exercised for 10 minutes and 46 seconds. He stopped due to fatigue. EF was 56%. There was normal perfusion with no evidence for ischemia or infarction. LHC (1/12): 60-70% mLAD stenosis.  ETT-myoview was then done to assess for ischemia in the LAD territory. Patient exercised 10', EF was 69% with no evidence for ischemia or infarction.   - ETT-Cardiolite (8/18): EF 56%, 10 minutes exercise, no evidence for ischemia/infarction (normal).  - LHC (10/19): Nonobstructive mild CAD.  5. Aortic stenosis and aortic regurgitation (bicuspid aortic valve). The patient did have an echocardiogram done on June 26, 2008. EF was 65%. There were no regional wall motion abnormalities. There was moderate aortic regurgitation. There was mild aortic stenosis by mean gradient which was 12 mmHg. There was mild ascending aorta and aortic root dilation. There was mild mitral regurgitation, mild left  atrial enlargement, and right atrial enlargement. TEE was done, confirming a functionally bicuspid aortic valve with fusion of the right and noncoronary cusps. There was mild aortic stenosis and moderate aortic insufficiency. The ascending aorta was mildly dilated at 3.9 cm. TTE (1/10) showed EF 60% with mild LVH, mild AS (mean gradient 12), mild to moderate AR, normal LV size. TTE (1/12) with EF 60%, mild LVH, moderate diastolic dysfunction, mild AS (mean  gradient 11 mmHg), mild AI, normal RV. MRA chest (1/12) with 3.7 cm aortic root and ascending aorta. TTE (2/13): EF 60-65%, mild LVH, mild AS, mild AI, mild MR.  Echo (2/15) with EF 60-65%, bicuspid aortic valve with mild aortic stenosis and mild to moderate AI, mild MR.  MRA chest (2/15) with 4.1 cm aortic root and 4.1 cm ascending aorta.  Echo (4/16) with EF 60-65%, mild AS mean gradient 16 mmHg.   - TEE (7/17) with EF 60-65%, bicuspid aortic valve with mild to moderate AS, moderate AI.  - Echo (6/18): EF 60-65%, moderate LVH, bicuspid aortic valve with mild to moderate AS (mean gradient 12, AVA 1.75 cm^2), mild AI, ascending aorta 4.1 cm.  - MRA chest (6/18): Ascending aorta 3.8 cm - Echo (10/19): EF 60-65%, mild LVH, mild AS, mild AI, bicuspid aortic valve.  - Echo (11/20): EF 60-65%, moderate LVH, normal RV size and systolic function, bicuspid aortic valve with mean gradient 14 mmHg and AVA 2 cm^2.  - Echo (11/21): EF 65-70%, mild LVH, normal RV, bicuspid aortic valve with mean gradient 19 mmHg and AVA 1.78 cm^2 (mild-moderate AS). 4.1 cm ascending aorta.  6. Biceps tendon rupture.  7. CKD 8. Ascending aortic aneurysm: TEE (2/17) with 4.0 cm ascending aorta.  - Echo (11/21): 4.1 cm ascending aorta.  9. Type II diabetes 10. OSA: Sleep study in 11/20 with severe OSA.  Family History:  The patient's mother has a history of stroke, also has questionable history of atrial fibrillation. The patient's father had an MI in his 58s.   Social History:  The patient is married, lives in [place], is a nonsmoker. He owns several businesses. He is not drinking alcohol.   Review of Systems  All systems reviewed and negative except as per HPI.   Current Outpatient Medications  Medication Sig Dispense Refill  . amLODipine (NORVASC) 10 MG tablet Take 1 tablet by mouth once daily 90 tablet 0  . atorvastatin (LIPITOR) 40 MG tablet Take 1 tablet by mouth once daily 90 tablet 0  . benazepril (LOTENSIN) 20  MG tablet Take 1 tablet by mouth once daily 90 tablet 1  . ELIQUIS 5 MG TABS tablet Take 1 tablet by mouth twice daily 180 tablet 1  . esomeprazole (NEXIUM) 20 MG capsule Take 1 capsule (20 mg total) by mouth daily at 12 noon. 90 capsule 3  . metFORMIN (GLUCOPHAGE-XR) 500 MG 24 hr tablet TAKE 2 TABLETS BY MOUTH ONCE DAILY WITH BREAKFAST 180 tablet 2  . metoprolol succinate (TOPROL-XL) 25 MG 24 hr tablet Take as needed for afib. (Patient taking differently: Take 25 mg by mouth daily as needed (afib). ) 30 tablet 0  . naproxen sodium (ALEVE) 220 MG tablet Take 660 mg by mouth daily as needed (pain).     No current facility-administered medications for this encounter.    BP (!) 142/82   Pulse (!) 57   Wt 95 kg (209 lb 6.4 oz)   SpO2 98%   BMI 29.21 kg/m  General: NAD Neck: No JVD, no thyromegaly  or thyroid nodule.  Lungs: Clear to auscultation bilaterally with normal respiratory effort. CV: Nondisplaced PMI.  Heart regular S1/S2, no S3/S4, 2/6 SEM RUSB with clear S2.  No peripheral edema.  No carotid bruit.  Normal pedal pulses.  Abdomen: Soft, nontender, no hepatosplenomegaly, no distention.  Skin: Intact without lesions or rashes.  Neurologic: Alert and oriented x 3.  Psych: Normal affect. Extremities: No clubbing or cyanosis.  HEENT: Normal.   Assessment/Plan: 1. Bicuspid aortic valve disorder: Mild-moderate aortic stenosis on today's echo. 4.1 cm ascending aorta on today's echo.    2. CAD: Moderate nonobstructive disease on 2012 cath and again on cath in 10/19.  No chest pain.  - Continue statin, check lipids today.   - He is not on ASA as he is taking apixaban.   3. Hyperlipidemia: Check lipids today.    4. HTN: BP higher today and weight is up.    - Continue amlodipine and benazepril.    - He did not tolerate HCTZ.  - He wants to work on weight loss before increasing his BP meds.  This is reasonable has BP has come down with weight loss in the past.  - OSA likely plays a role  in HTN but he has not wanted to use CPAP.   5. Atrial fibrillation: Paroxysmal atrial fibrillation seen by [facility] monitor.  He is symptomatic when in atrial fibrillation, but episodes are short and infrequent.  NSR today.  - He has a Multimedia programmer.  - Continue apixaban.  - Atrial fibrillation is not frequent enough to take a rhythm control path at this point.  - He has Toprol XL just for prn use (if he feels atrial fibrillation).  6. OSA: Severe on 11/20 sleep study. Treatment could help lower BP and also decrease risk for recurrent atrial fibrillation. However, he really does not want to use CPAP. We talked about dental appliance.  He thinks that raising the head of his bed helps, so he will do this for now.  If he wants to try CPAP, he can call office and I will arrange.   Followup 6 months   [name] 04/23/2020

## 2020-04-23 NOTE — Patient Instructions (Signed)
Labs done today, your results will be available in MyChart, we will contact you for abnormal readings.  Please call our office in April 2022 to schedule your follow up appointment  If you have any questions or concerns before your next appointment please send [place] a message through [place] or call our office at [phone].    TO LEAVE A MESSAGE FOR THE NURSE SELECT OPTION 2, PLEASE LEAVE A MESSAGE INCLUDING: . YOUR NAME . DATE OF BIRTH . CALL BACK NUMBER . REASON FOR CALL**this is important as we prioritize the call backs  [place] AS LONG AS YOU CALL BEFORE 4:00 PM  At the [facility], you and your health needs are our priority. As part of our continuing mission to provide you with exceptional heart care, we have created designated Provider Care Teams. These Care Teams include your primary Cardiologist (physician) and Advanced Practice Providers (APPs- Physician Assistants and Nurse Practitioners) who all work together to provide you with the care you need, when you need it.   You may see any of the following providers on your designated Care Team at your next follow up: Marland Kitchen Dr Glori Bickers . Dr [name] . Darrick Grinder, NP . Lyda Jester, PA . Audry Riles, PharmD   Please be sure to bring in all your medications bottles to every appointment.

## 2020-04-25 ENCOUNTER — Other Ambulatory Visit (HOSPITAL_COMMUNITY): Payer: Self-pay | Admitting: *Deleted

## 2020-04-25 ENCOUNTER — Other Ambulatory Visit (HOSPITAL_COMMUNITY): Payer: Self-pay | Admitting: Cardiology

## 2020-04-25 DIAGNOSIS — I480 Paroxysmal atrial fibrillation: Secondary | ICD-10-CM

## 2020-04-25 DIAGNOSIS — I2510 Atherosclerotic heart disease of native coronary artery without angina pectoris: Secondary | ICD-10-CM

## 2020-04-25 MED ORDER — APIXABAN 5 MG PO TABS: 5.0000 mg | ORAL_TABLET | Freq: Two times a day (BID) | ORAL | 3 refills | Status: DC

## 2020-05-13 ENCOUNTER — Ambulatory Visit: Payer: Medicare Other | Admitting: Family Medicine

## 2020-05-14 DIAGNOSIS — Z961 Presence of intraocular lens: Secondary | ICD-10-CM | POA: Diagnosis not present

## 2020-05-14 DIAGNOSIS — H43813 Vitreous degeneration, bilateral: Secondary | ICD-10-CM | POA: Diagnosis not present

## 2020-05-14 DIAGNOSIS — E119 Type 2 diabetes mellitus without complications: Secondary | ICD-10-CM | POA: Diagnosis not present

## 2020-05-14 DIAGNOSIS — H35373 Puckering of macula, bilateral: Secondary | ICD-10-CM | POA: Diagnosis not present

## 2020-05-14 LAB — HM DIABETES EYE EXAM

## 2020-05-16 ENCOUNTER — Ambulatory Visit: Payer: Medicare Other | Admitting: Family Medicine

## 2020-05-21 ENCOUNTER — Encounter: Payer: Self-pay | Admitting: Family Medicine

## 2020-05-21 ENCOUNTER — Other Ambulatory Visit: Payer: Self-pay | Admitting: Family Medicine

## 2020-06-01 ENCOUNTER — Other Ambulatory Visit (HOSPITAL_COMMUNITY): Payer: Self-pay | Admitting: Cardiology

## 2020-06-24 ENCOUNTER — Other Ambulatory Visit: Payer: Self-pay | Admitting: Family Medicine

## 2020-06-24 DIAGNOSIS — E785 Hyperlipidemia, unspecified: Secondary | ICD-10-CM

## 2020-08-09 DIAGNOSIS — N130 Hydronephrosis with ureteropelvic junction obstruction: Secondary | ICD-10-CM | POA: Diagnosis not present

## 2020-08-09 DIAGNOSIS — Z87442 Personal history of urinary calculi: Secondary | ICD-10-CM | POA: Diagnosis not present

## 2020-08-13 ENCOUNTER — Other Ambulatory Visit: Payer: Self-pay | Admitting: Urology

## 2020-08-13 ENCOUNTER — Other Ambulatory Visit (HOSPITAL_COMMUNITY): Payer: Self-pay | Admitting: Urology

## 2020-08-13 DIAGNOSIS — N130 Hydronephrosis with ureteropelvic junction obstruction: Secondary | ICD-10-CM

## 2020-08-15 ENCOUNTER — Ambulatory Visit: Payer: Medicare Other | Admitting: Family Medicine

## 2020-08-19 ENCOUNTER — Other Ambulatory Visit: Payer: Self-pay

## 2020-08-19 ENCOUNTER — Encounter: Payer: Self-pay | Admitting: Family Medicine

## 2020-08-19 ENCOUNTER — Ambulatory Visit (INDEPENDENT_AMBULATORY_CARE_PROVIDER_SITE_OTHER): Payer: Medicare Other | Admitting: Family Medicine

## 2020-08-19 VITALS — BP 121/72 | HR 84 | Temp 97.9°F | Ht 71.0 in | Wt 199.8 lb

## 2020-08-19 DIAGNOSIS — I152 Hypertension secondary to endocrine disorders: Secondary | ICD-10-CM | POA: Diagnosis not present

## 2020-08-19 DIAGNOSIS — E1169 Type 2 diabetes mellitus with other specified complication: Secondary | ICD-10-CM | POA: Diagnosis not present

## 2020-08-19 DIAGNOSIS — E1159 Type 2 diabetes mellitus with other circulatory complications: Secondary | ICD-10-CM | POA: Diagnosis not present

## 2020-08-19 LAB — POCT GLYCOSYLATED HEMOGLOBIN (HGB A1C): Hemoglobin A1C: 5.8 % — AB (ref 4.0–5.6)

## 2020-08-19 NOTE — Patient Instructions (Signed)
It was very nice to see you today!  Your A1c looks great.  We would not make any changes today.  Please continue working on diet and exercise.  I will see you back in 6 months.  Please come back to see me sooner if needed.  Take care, Dr Jerline Pain  Please try these tips to maintain a healthy lifestyle:   Eat at least 3 REAL meals and 1-2 snacks per day.  Aim for no more than 5 hours between eating.  If you eat breakfast, please do so within one hour of getting up.    Each meal should contain half fruits/vegetables, one quarter protein, and one quarter carbs (no bigger than a computer mouse)   Cut down on sweet beverages. This includes juice, soda, and sweet tea.     Drink at least 1 glass of water with each meal and aim for at least 8 glasses per day   Exercise at least 150 minutes every week.

## 2020-08-19 NOTE — Progress Notes (Signed)
   Samuel Willis is a 75 y.o. male who presents today for an office visit.  Assessment/Plan:  Chronic Problems Addressed Today: Type 2 diabetes mellitus with other specified complication ([place]), controlled with Metformin A1c stable at 5.8. Continue metformin 1000mg  daily. Follow up in 6 months.   Hypertension associated with diabetes ([place]) At goal on norvasc 10mg  daily and benazepril 20mg  daily.      Subjective:  HPI:  See a/p.         Objective:  Physical Exam: BP 121/72   Pulse 84   Temp 97.9 F (36.6 C) (Temporal)   Ht 5\' 11"  (1.803 m)   Wt 199 lb 12.8 oz (90.6 kg)   SpO2 99%   BMI 27.87 kg/m   Gen: No acute distress, resting comfortably Neuro: Grossly normal, moves all extremities Psych: Normal affect and thought content      Caleb M. Jerline Pain, MD 08/19/2020 9:46 AM

## 2020-08-19 NOTE — Assessment & Plan Note (Signed)
A1c stable at 5.8. Continue metformin 1000mg  daily. Follow up in 6 months.

## 2020-08-19 NOTE — Assessment & Plan Note (Signed)
At goal on norvasc 10mg  daily and benazepril 20mg  daily.

## 2020-08-22 ENCOUNTER — Encounter (HOSPITAL_COMMUNITY)
Admission: RE | Admit: 2020-08-22 | Discharge: 2020-08-22 | Disposition: A | Discharge status: Home / Self Care | Payer: Medicare Other | Source: Ambulatory Visit | Attending: Urology | Admitting: Urology

## 2020-08-22 DIAGNOSIS — N130 Hydronephrosis with ureteropelvic junction obstruction: Secondary | ICD-10-CM | POA: Insufficient documentation

## 2020-08-22 DIAGNOSIS — N132 Hydronephrosis with renal and ureteral calculous obstruction: Secondary | ICD-10-CM | POA: Diagnosis not present

## 2020-08-22 DIAGNOSIS — N19 Unspecified kidney failure: Secondary | ICD-10-CM | POA: Diagnosis not present

## 2020-08-22 MED ORDER — TECHNETIUM TC 99M MERTIATIDE
5.5000 | Freq: Once | INTRAVENOUS | Status: AC
  Administered 2020-08-22: 5.5 via INTRAVENOUS

## 2020-08-22 MED ORDER — FUROSEMIDE 10 MG/ML IJ SOLN: 45.0000 mg | Freq: Once | INTRAMUSCULAR | Status: AC

## 2020-08-22 MED ORDER — FUROSEMIDE 10 MG/ML IJ SOLN
INTRAMUSCULAR | Status: AC
  Administered 2020-08-22: 44 mg via INTRAVENOUS
  Filled 2020-08-22: qty 8

## 2020-08-29 ENCOUNTER — Other Ambulatory Visit: Payer: Self-pay | Admitting: Family Medicine

## 2020-08-30 ENCOUNTER — Other Ambulatory Visit (HOSPITAL_COMMUNITY): Payer: Self-pay

## 2020-08-30 DIAGNOSIS — R3121 Asymptomatic microscopic hematuria: Secondary | ICD-10-CM | POA: Diagnosis not present

## 2020-08-30 DIAGNOSIS — N401 Enlarged prostate with lower urinary tract symptoms: Secondary | ICD-10-CM | POA: Diagnosis not present

## 2020-08-30 DIAGNOSIS — N130 Hydronephrosis with ureteropelvic junction obstruction: Secondary | ICD-10-CM | POA: Diagnosis not present

## 2020-08-30 DIAGNOSIS — R3912 Poor urinary stream: Secondary | ICD-10-CM | POA: Diagnosis not present

## 2020-08-30 DIAGNOSIS — N200 Calculus of kidney: Secondary | ICD-10-CM | POA: Diagnosis not present

## 2020-08-30 MED ORDER — BENAZEPRIL HCL 20 MG PO TABS
20.0000 mg | ORAL_TABLET | Freq: Every day | ORAL | 0 refills | Status: DC
Start: 2020-08-30 — End: 2020-10-28

## 2020-09-05 DIAGNOSIS — D1801 Hemangioma of skin and subcutaneous tissue: Secondary | ICD-10-CM | POA: Diagnosis not present

## 2020-09-05 DIAGNOSIS — Z85828 Personal history of other malignant neoplasm of skin: Secondary | ICD-10-CM | POA: Diagnosis not present

## 2020-09-05 DIAGNOSIS — L821 Other seborrheic keratosis: Secondary | ICD-10-CM | POA: Diagnosis not present

## 2020-09-05 DIAGNOSIS — D225 Melanocytic nevi of trunk: Secondary | ICD-10-CM | POA: Diagnosis not present

## 2020-09-05 DIAGNOSIS — D692 Other nonthrombocytopenic purpura: Secondary | ICD-10-CM | POA: Diagnosis not present

## 2020-09-05 DIAGNOSIS — L570 Actinic keratosis: Secondary | ICD-10-CM | POA: Diagnosis not present

## 2020-09-05 DIAGNOSIS — D224 Melanocytic nevi of scalp and neck: Secondary | ICD-10-CM | POA: Diagnosis not present

## 2020-09-06 ENCOUNTER — Ambulatory Visit: Payer: Medicare Other

## 2020-09-06 DIAGNOSIS — Z0000 Encounter for general adult medical examination without abnormal findings: Secondary | ICD-10-CM

## 2020-09-06 NOTE — Patient Instructions (Incomplete)
Samuel Willis , Thank you for taking time to come for your Medicare Wellness Visit. I appreciate your ongoing commitment to your health goals. Please review the following plan we discussed and let me know if I can assist you in the future.   Screening recommendations/referrals: Colonoscopy: Done 12/15/17 Recommended yearly ophthalmology/optometry visit for glaucoma screening and checkup Recommended yearly dental visit for hygiene and checkup  Vaccinations: Pneumococcal vaccine: Due and discussed Tdap vaccine: Done per pt 08/14/13 Shingles vaccine: Completed 4/27 & 01/21/18   Covid-19: ***  Advanced directives: Copies in chart  Conditions/risks identified: ***  Next appointment: Follow up in one year for your annual wellness visit.   Preventive Care 32 Years and Older, Male Preventive care refers to lifestyle choices and visits with your health care provider that can promote health and wellness. What does preventive care include?  A yearly physical exam. This is also called an annual well check.  Dental exams once or twice a year.  Routine eye exams. Ask your health care provider how often you should have your eyes checked.  Personal lifestyle choices, including:  Daily care of your teeth and gums.  Regular physical activity.  Eating a healthy diet.  Avoiding tobacco and drug use.  Limiting alcohol use.  Practicing safe sex.  Taking low doses of aspirin every day.  Taking vitamin and mineral supplements as recommended by your health care provider. What happens during an annual well check? The services and screenings done by your health care provider during your annual well check will depend on your age, overall health, lifestyle risk factors, and family history of disease. Counseling  Your health care provider may ask you questions about your:  Alcohol use.  Tobacco use.  Drug use.  Emotional well-being.  Home and relationship well-being.  Sexual  activity.  Eating habits.  History of falls.  Memory and ability to understand (cognition).  Work and work Statistician. Screening  You may have the following tests or measurements:  Height, weight, and BMI.  Blood pressure.  Lipid and cholesterol levels. These may be checked every 5 years, or more frequently if you are over 38 years old.  Skin check.  Lung cancer screening. You may have this screening every year starting at age 70 if you have a 30-pack-year history of smoking and currently smoke or have quit within the past 15 years.  Fecal occult blood test (FOBT) of the stool. You may have this test every year starting at age 34.  Flexible sigmoidoscopy or colonoscopy. You may have a sigmoidoscopy every 5 years or a colonoscopy every 10 years starting at age 2.  Prostate cancer screening. Recommendations will vary depending on your family history and other risks.  Hepatitis C blood test.  Hepatitis B blood test.  Sexually transmitted disease (STD) testing.  Diabetes screening. This is done by checking your blood sugar (glucose) after you have not eaten for a while (fasting). You may have this done every 1-3 years.  Abdominal aortic aneurysm (AAA) screening. You may need this if you are a current or former smoker.  Osteoporosis. You may be screened starting at age 58 if you are at high risk. Talk with your health care provider about your test results, treatment options, and if necessary, the need for more tests. Vaccines  Your health care provider may recommend certain vaccines, such as:  Influenza vaccine. This is recommended every year.  Tetanus, diphtheria, and acellular pertussis (Tdap, Td) vaccine. You may need a Td booster every  10 years.  Zoster vaccine. You may need this after age 76.  Pneumococcal 13-valent conjugate (PCV13) vaccine. One dose is recommended after age 82.  Pneumococcal polysaccharide (PPSV23) vaccine. One dose is recommended after age  87. Talk to your health care provider about which screenings and vaccines you need and how often you need them. This information is not intended to replace advice given to you by your health care provider. Make sure you discuss any questions you have with your health care provider. Document Released: 06/28/2015 Document Revised: 02/19/2016 Document Reviewed: 04/02/2015 Elsevier Interactive Patient Education  2017 [place] Prevention in the Home Falls can cause injuries. They can happen to people of all ages. There are many things you can do to make your home safe and to help prevent falls. What can I do on the outside of my home?  Regularly fix the edges of walkways and driveways and fix any cracks.  Remove anything that might make you trip as you walk through a door, such as a raised step or threshold.  Trim any bushes or trees on the path to your home.  Use bright outdoor lighting.  Clear any walking paths of anything that might make someone trip, such as rocks or tools.  Regularly check to see if handrails are loose or broken. Make sure that both sides of any steps have handrails.  Any raised decks and porches should have guardrails on the edges.  Have any leaves, snow, or ice cleared regularly.  Use sand or salt on walking paths during winter.  Clean up any spills in your garage right away. This includes oil or grease spills. What can I do in the bathroom?  Use night lights.  Install grab bars by the toilet and in the tub and shower. Do not use towel bars as grab bars.  Use non-skid mats or decals in the tub or shower.  If you need to sit down in the shower, use a plastic, non-slip stool.  Keep the floor dry. Clean up any water that spills on the floor as soon as it happens.  Remove soap buildup in the tub or shower regularly.  Attach bath mats securely with double-sided non-slip rug tape.  Do not have throw rugs and other things on the floor that can make  you trip. What can I do in the bedroom?  Use night lights.  Make sure that you have a light by your bed that is easy to reach.  Do not use any sheets or blankets that are too big for your bed. They should not hang down onto the floor.  Have a firm chair that has side arms. You can use this for support while you get dressed.  Do not have throw rugs and other things on the floor that can make you trip. What can I do in the kitchen?  Clean up any spills right away.  Avoid walking on wet floors.  Keep items that you use a lot in easy-to-reach places.  If you need to reach something above you, use a strong step stool that has a grab bar.  Keep electrical cords out of the way.  Do not use floor polish or wax that makes floors slippery. If you must use wax, use non-skid floor wax.  Do not have throw rugs and other things on the floor that can make you trip. What can I do with my stairs?  Do not leave any items on the stairs.  Make sure  that there are handrails on both sides of the stairs and use them. Fix handrails that are broken or loose. Make sure that handrails are as long as the stairways.  Check any carpeting to make sure that it is firmly attached to the stairs. Fix any carpet that is loose or worn.  Avoid having throw rugs at the top or bottom of the stairs. If you do have throw rugs, attach them to the floor with carpet tape.  Make sure that you have a light switch at the top of the stairs and the bottom of the stairs. If you do not have them, ask someone to add them for you. What else can I do to help prevent falls?  Wear shoes that:  Do not have high heels.  Have rubber bottoms.  Are comfortable and fit you well.  Are closed at the toe. Do not wear sandals.  If you use a stepladder:  Make sure that it is fully opened. Do not climb a closed stepladder.  Make sure that both sides of the stepladder are locked into place.  Ask someone to hold it for you, if  possible.  Clearly mark and make sure that you can see:  Any grab bars or handrails.  First and last steps.  Where the edge of each step is.  Use tools that help you move around (mobility aids) if they are needed. These include:  Canes.  Walkers.  Scooters.  Crutches.  Turn on the lights when you go into a dark area. Replace any light bulbs as soon as they burn out.  Set up your furniture so you have a clear path. Avoid moving your furniture around.  If any of your floors are uneven, fix them.  If there are any pets around you, be aware of where they are.  Review your medicines with your doctor. Some medicines can make you feel dizzy. This can increase your chance of falling. Ask your doctor what other things that you can do to help prevent falls. This information is not intended to replace advice given to you by your health care provider. Make sure you discuss any questions you have with your health care provider. Document Released: 03/28/2009 Document Revised: 11/07/2015 Document Reviewed: 07/06/2014 Elsevier Interactive Patient Education  2017 [employer].

## 2020-10-08 ENCOUNTER — Telehealth (HOSPITAL_COMMUNITY): Payer: Self-pay | Admitting: *Deleted

## 2020-10-08 NOTE — Telephone Encounter (Signed)
Pt called back and said bp only ran high two days and is back normal for now he will keep log and monitor will call back for med adjustment if bp continuously runs high.

## 2020-10-08 NOTE — Telephone Encounter (Signed)
Pt called stating recently his bp has been elevated  bp 145-166/80's heart rate 66. Pt has an appt in May but wants to know if he should make any med changes before then.  Pt is asymptomatic.

## 2020-10-08 NOTE — Telephone Encounter (Signed)
He can increase benazepril to 40 mg daily.  Will need BMET in 10 days.

## 2020-10-23 ENCOUNTER — Other Ambulatory Visit (HOSPITAL_COMMUNITY): Payer: Self-pay

## 2020-10-23 DIAGNOSIS — I350 Nonrheumatic aortic (valve) stenosis: Secondary | ICD-10-CM

## 2020-10-23 NOTE — Progress Notes (Signed)
Orders Placed This Encounter  Procedures  . ECHOCARDIOGRAM COMPLETE    Standing Status:   Future    Standing Expiration Date:   10/23/2021    Order Specific Question:   Where should this test be performed    Answer:   [facility]    Order Specific Question:   Perflutren DEFINITY (image enhancing agent) should be administered unless hypersensitivity or allergy exist    Answer:   Administer Perflutren    Order Specific Question:   Reason for exam-Echo    Answer:   Aortic valve disorder I35.9    Order Specific Question:   Release to patient    Answer:   Immediate

## 2020-10-27 ENCOUNTER — Other Ambulatory Visit (HOSPITAL_COMMUNITY): Payer: Self-pay | Admitting: Cardiology

## 2020-10-27 ENCOUNTER — Other Ambulatory Visit: Payer: Self-pay | Admitting: Family Medicine

## 2020-10-27 DIAGNOSIS — I480 Paroxysmal atrial fibrillation: Secondary | ICD-10-CM

## 2020-10-27 DIAGNOSIS — I2510 Atherosclerotic heart disease of native coronary artery without angina pectoris: Secondary | ICD-10-CM

## 2020-10-28 ENCOUNTER — Ambulatory Visit (HOSPITAL_COMMUNITY)
Admission: RE | Admit: 2020-10-28 | Discharge: 2020-10-28 | Disposition: A | Discharge status: Home / Self Care | Payer: Medicare Other | Source: Ambulatory Visit | Attending: Family Medicine | Admitting: Family Medicine

## 2020-10-28 ENCOUNTER — Other Ambulatory Visit: Payer: Self-pay

## 2020-10-28 ENCOUNTER — Ambulatory Visit (HOSPITAL_BASED_OUTPATIENT_CLINIC_OR_DEPARTMENT_OTHER)
Admission: RE | Admit: 2020-10-28 | Discharge: 2020-10-28 | Disposition: A | Discharge status: Home / Self Care | Payer: Medicare Other | Source: Ambulatory Visit | Attending: Cardiology | Admitting: Cardiology

## 2020-10-28 ENCOUNTER — Encounter (HOSPITAL_COMMUNITY): Payer: Self-pay | Admitting: Cardiology

## 2020-10-28 VITALS — BP 114/68 | HR 60 | Wt 196.8 lb

## 2020-10-28 DIAGNOSIS — I2510 Atherosclerotic heart disease of native coronary artery without angina pectoris: Secondary | ICD-10-CM | POA: Diagnosis not present

## 2020-10-28 DIAGNOSIS — N189 Chronic kidney disease, unspecified: Secondary | ICD-10-CM | POA: Insufficient documentation

## 2020-10-28 DIAGNOSIS — I129 Hypertensive chronic kidney disease with stage 1 through stage 4 chronic kidney disease, or unspecified chronic kidney disease: Secondary | ICD-10-CM | POA: Insufficient documentation

## 2020-10-28 DIAGNOSIS — I350 Nonrheumatic aortic (valve) stenosis: Secondary | ICD-10-CM

## 2020-10-28 DIAGNOSIS — Z79899 Other long term (current) drug therapy: Secondary | ICD-10-CM | POA: Insufficient documentation

## 2020-10-28 DIAGNOSIS — G4733 Obstructive sleep apnea (adult) (pediatric): Secondary | ICD-10-CM | POA: Insufficient documentation

## 2020-10-28 DIAGNOSIS — Q231 Congenital insufficiency of aortic valve: Secondary | ICD-10-CM | POA: Diagnosis not present

## 2020-10-28 DIAGNOSIS — I480 Paroxysmal atrial fibrillation: Secondary | ICD-10-CM | POA: Diagnosis not present

## 2020-10-28 DIAGNOSIS — Z7984 Long term (current) use of oral hypoglycemic drugs: Secondary | ICD-10-CM | POA: Diagnosis not present

## 2020-10-28 DIAGNOSIS — E785 Hyperlipidemia, unspecified: Secondary | ICD-10-CM | POA: Insufficient documentation

## 2020-10-28 DIAGNOSIS — Z7901 Long term (current) use of anticoagulants: Secondary | ICD-10-CM | POA: Diagnosis not present

## 2020-10-28 DIAGNOSIS — Z8249 Family history of ischemic heart disease and other diseases of the circulatory system: Secondary | ICD-10-CM | POA: Diagnosis not present

## 2020-10-28 LAB — ECHOCARDIOGRAM COMPLETE
AR max vel: 1.36 cm2
AV Area VTI: 1.41 cm2
AV Area mean vel: 1.29 cm2
AV Mean grad: 17 mmHg
AV Peak grad: 30 mmHg
Ao pk vel: 2.74 m/s
Area-P 1/2: 4.06 cm2
P 1/2 time: 776 msec
S' Lateral: 3.2 cm
Single Plane A4C EF: 62.6 %

## 2020-10-28 MED ORDER — BENAZEPRIL HCL 10 MG PO TABS: 10.0000 mg | ORAL_TABLET | Freq: Two times a day (BID) | ORAL | 3 refills | Status: AC

## 2020-10-28 NOTE — Patient Instructions (Signed)
Please call our office in October to schedule your follow up appintment  If you have any questions or concerns before your next appointment please send [place] a message through [place] or call our office at [phone].    TO LEAVE A MESSAGE FOR THE NURSE SELECT OPTION 2, PLEASE LEAVE A MESSAGE INCLUDING: . YOUR NAME . DATE OF BIRTH . CALL BACK NUMBER . REASON FOR CALL**this is important as we prioritize the call backs  [place] AS LONG AS YOU CALL BEFORE 4:00 PM  At the [facility], you and your health needs are our priority. As part of our continuing mission to provide you with exceptional heart care, we have created designated Provider Care Teams. These Care Teams include your primary Cardiologist (physician) and Advanced Practice Providers (APPs- Physician Assistants and Nurse Practitioners) who all work together to provide you with the care you need, when you need it.   You may see any of the following providers on your designated Care Team at your next follow up: Marland Kitchen Dr Glori Bickers . Dr [name] . Dr Vickki Muff . Darrick Grinder, NP . Lyda Jester, [place] . Audry Riles, PharmD   Please be sure to bring in all your medications bottles to every appointment.

## 2020-10-28 NOTE — Progress Notes (Signed)
  Echocardiogram 2D Echocardiogram has been performed.  Samuel Willis 10/28/2020, 10:41 AM

## 2020-10-28 NOTE — Progress Notes (Signed)
Patient ID: Samuel Willis, male   DOB: 1945/09/17, 75 y.o.   MRN: [id number] PCP: Dr. Anitra Lauth Cardiology: Dr. [name]  75 y.o. with history of bicuspid aortic valve and mild-moderate AS/mild AI on last echo in 6/18 as well as paroxysmal atrial fibrillation presents for followup. He had a Watchman device placed in 5/17. Cardiolite in 8/18 showed no evidence for ischemia/infarction.   He had recurrent atrial fibrillation in 9/19, went back into NSR spontaneously after 4-5 days.  He started apixaban in case he needed DCCV and has stayed on it (had not been willing to take in the past).   Echo (10/19) showed EF 60-65%, mild AS, mild AI with bicuspid aortic valve.   LHC was done in 10/19 showing nonobstructive mild coronary disease.   Echo in 11/20 showed EF 60-65%, moderate LVH, mild AS with bicuspid aortic valve.   Sleep study in 11/20 showed severe OSA, he has not wanted to use CPAP.   Echo 11/21 with EF 65-70% with mild LVH, normal RV, mild-moderate AS (no change).  Echo done today and reviewed, EF 60-65%, mild LVH, grade 2 diastolic dysfunction, normal RV, mild MR, PASP 35, bicuspid aortic valve with mild AI and mild-moderate AS mean gradient 17 mmHg, AVA 1.4 cm^2.   Patient returns for followup of aortic valve disease and atrial fibrillation.  He continues to do well, plays golf and does extensive yardwork.  He is able to jog some still, does push ups and exercises on glider machine at home.  No chest pain.  No exertional dyspnea.  BP controlled.  Weight down 13 lbs.  He feels like he has had 1 episode of atrial fibrillation in the last few months, lasted about a day and then resolved.  He is in NSR (by telemetry on echo) today. Of note, will be moving to [place], [place] to live near his kids.   Labs (12/10): creatinine 1.4  Labs (6/12): K 4.5, creatinine 1.2, LDL 66, HDL 48 Labs (7/14): K 4.5, creatinine 1.3, LDL 71, HDL 49 Labs (1/15): K 4.8, creatinine 1.5, LDL 58, HDL 36 Labs (2/16): K  4.5, creatinine 1.17, LDL 83, HDL 42, TSH normal Labs (3/17): LDL 72, HDL 44 Labs (5/17): K 4.1, creatinine 1.2 Labs (4/18): LDL 92, HDL 37 Labs (8/18): K 5.1, creatinine 1.27, LDL 55, HDL 42 Labs (8/19): LDL 69, HDL 41 Labs (9/19): TSH normal, K 4.7, creatinine 1.22 Labs (9/20): LDL 84 Labs (11/20): K 4.3, creatinine 1.07 Labs (4/21): K 5.3, creatinine 1.04 Labs (11/21): LDL 69, HDL 52, K 4.5, creatinine 0.97, hgb 14.8  Allergies (verified):  No Known Drug Allergies   Past Medical History:  1.  Atrial fibrillation: Paroxysmal. Patient has a brief episode of irregular heart beating every 1-2 years. One was apparently documented as atrial fibrillation years ago in [employer]. His CHADSVASC score is 3 now that CAD has been found. LINQ monitor was placed and has documented paroxysmal atrial fibrillation. - S/p Watchman placement in 5/17.  2. Hypertension.  3. Hyperlipidemia.  4. CAD: Exercise treadmill Myoview done in January 2010. The patient exercised for 10 minutes and 46 seconds. He stopped due to fatigue. EF was 56%. There was normal perfusion with no evidence for ischemia or infarction. LHC (1/12): 60-70% mLAD stenosis.  ETT-myoview was then done to assess for ischemia in the LAD territory. Patient exercised 10', EF was 69% with no evidence for ischemia or infarction.   - ETT-Cardiolite (8/18): EF 56%, 10 minutes exercise, no evidence for  ischemia/infarction (normal).  - LHC (10/19): Nonobstructive mild CAD.  5. Aortic stenosis and aortic regurgitation (bicuspid aortic valve). The patient did have an echocardiogram done on June 26, 2008. EF was 65%. There were no regional wall motion abnormalities. There was moderate aortic regurgitation. There was mild aortic stenosis by mean gradient which was 12 mmHg. There was mild ascending aorta and aortic root dilation. There was mild mitral regurgitation, mild left atrial enlargement, and right atrial enlargement. TEE was done, confirming a  functionally bicuspid aortic valve with fusion of the right and noncoronary cusps. There was mild aortic stenosis and moderate aortic insufficiency. The ascending aorta was mildly dilated at 3.9 cm. TTE (1/10) showed EF 60% with mild LVH, mild AS (mean gradient 12), mild to moderate AR, normal LV size. TTE (1/12) with EF 60%, mild LVH, moderate diastolic dysfunction, mild AS (mean gradient 11 mmHg), mild AI, normal RV. MRA chest (1/12) with 3.7 cm aortic root and ascending aorta. TTE (2/13): EF 60-65%, mild LVH, mild AS, mild AI, mild MR.  Echo (2/15) with EF 60-65%, bicuspid aortic valve with mild aortic stenosis and mild to moderate AI, mild MR.  MRA chest (2/15) with 4.1 cm aortic root and 4.1 cm ascending aorta.  Echo (4/16) with EF 60-65%, mild AS mean gradient 16 mmHg.   - TEE (7/17) with EF 60-65%, bicuspid aortic valve with mild to moderate AS, moderate AI.  - Echo (6/18): EF 60-65%, moderate LVH, bicuspid aortic valve with mild to moderate AS (mean gradient 12, AVA 1.75 cm^2), mild AI, ascending aorta 4.1 cm.  - MRA chest (6/18): Ascending aorta 3.8 cm - Echo (10/19): EF 60-65%, mild LVH, mild AS, mild AI, bicuspid aortic valve.  - Echo (11/20): EF 60-65%, moderate LVH, normal RV size and systolic function, bicuspid aortic valve with mean gradient 14 mmHg and AVA 2 cm^2.  - Echo (11/21): EF 65-70%, mild LVH, normal RV, bicuspid aortic valve with mean gradient 19 mmHg and AVA 1.78 cm^2 (mild-moderate AS). 4.1 cm ascending aorta.  - Echo (5/22): EF 60-65%, mild LVH, grade 2 diastolic dysfunction, normal RV, mild MR, PASP 35, bicuspid aortic valve with mild AI and mild-moderate AS mean gradient 17 mmHg, AVA 1.4 cm^2. 6. Biceps tendon rupture.  7. CKD 8. Ascending aortic aneurysm: TEE (2/17) with 4.0 cm ascending aorta.  - Echo (11/21): 4.1 cm ascending aorta.  9. Type II diabetes 10. OSA: Sleep study in 11/20 with severe OSA.  Family History:  The patient's mother has a history of stroke, also  has questionable history of atrial fibrillation. The patient's father had an MI in his 13s.   Social History:  The patient is married, lives in [place], is a nonsmoker. He owns several businesses. He is not drinking alcohol.   Review of Systems  All systems reviewed and negative except as per HPI.   Current Outpatient Medications  Medication Sig Dispense Refill  . amLODipine (NORVASC) 10 MG tablet Take 1 tablet by mouth once daily 90 tablet 0  . apixaban (ELIQUIS) 5 MG TABS tablet Take 1 tablet (5 mg total) by mouth 2 (two) times daily. 90 tablet 3  . atorvastatin (LIPITOR) 40 MG tablet Take 1 tablet by mouth once daily 90 tablet 0  . esomeprazole (NEXIUM) 20 MG capsule Take 1 capsule (20 mg total) by mouth daily at 12 noon. 90 capsule 3  . metFORMIN (GLUCOPHAGE-XR) 500 MG 24 hr tablet TAKE 2 TABLETS BY MOUTH ONCE DAILY WITH BREAKFAST 180 tablet 2  .  metoprolol succinate (TOPROL-XL) 25 MG 24 hr tablet Take as needed for afib. 30 tablet 0  . naproxen sodium (ALEVE) 220 MG tablet Take 660 mg by mouth daily as needed (pain).    . benazepril (LOTENSIN) 10 MG tablet Take 1 tablet (10 mg total) by mouth 2 (two) times daily. 180 tablet 3   No current facility-administered medications for this encounter.    BP 114/68   Pulse 60   Wt 89.3 kg (196 lb 12.8 oz)   SpO2 98%   BMI 27.45 kg/m  General: NAD Neck: No JVD, no thyromegaly or thyroid nodule.  Lungs: Clear to auscultation bilaterally with normal respiratory effort. CV: Nondisplaced PMI.  Heart regular S1/S2, no S3/S4, 2/6 SEM RUSB with clear S2.  No peripheral edema.  No carotid bruit.  Normal pedal pulses.  Abdomen: Soft, nontender, no hepatosplenomegaly, no distention.  Skin: Intact without lesions or rashes.  Neurologic: Alert and oriented x 3.  Psych: Normal affect. Extremities: No clubbing or cyanosis.  HEENT: Normal.   Assessment/Plan: 1. Bicuspid aortic valve disorder: Mild-moderate aortic stenosis with mild AI on today's  echo, unchanged. 3.7 cm ascending aorta on today's echo.    - He can wait 1-2 years before repeating study depending on symptoms.  2. CAD: Moderate nonobstructive disease on 2012 cath and again on cath in 10/19.  No chest pain.  - Continue statin, good lipids in 11/21.  - He is not on ASA as he is taking apixaban.   3. Hyperlipidemia: Continue atorvastatin.  4. HTN: Weight is down, BP under better control.     - Continue amlodipine and benazepril. If BP falls further with additional weight loss, can decrease benazepril dose.    - He did not tolerate HCTZ.  - OSA likely plays a role in HTN but he has not wanted to use CPAP.   5. Atrial fibrillation: Paroxysmal atrial fibrillation seen by [facility] monitor.  He is symptomatic when in atrial fibrillation, but episodes are short and infrequent.  NSR today.  - He has a Multimedia programmer.  - Continue apixaban.  - Atrial fibrillation is not frequent enough to take a rhythm control path at this point.  - He has Toprol XL just for prn use (if he feels atrial fibrillation).  6. OSA: Severe on 11/20 sleep study. Treatment could help lower BP and also decrease risk for recurrent atrial fibrillation. However, he really does not want to use CPAP. We talked about dental appliance.  He thinks that raising the head of his bed helps, so he will do this for now.  If he wants to try CPAP, he can call office and I will arrange.   Followup 6 months   [name] 10/28/2020

## 2020-12-02 ENCOUNTER — Other Ambulatory Visit: Payer: Self-pay | Admitting: Family Medicine

## 2020-12-26 ENCOUNTER — Other Ambulatory Visit: Payer: Self-pay | Admitting: Family Medicine

## 2020-12-26 DIAGNOSIS — E785 Hyperlipidemia, unspecified: Secondary | ICD-10-CM

## 2021-01-02 ENCOUNTER — Other Ambulatory Visit (HOSPITAL_COMMUNITY): Payer: Self-pay | Admitting: Cardiology

## 2021-01-02 DIAGNOSIS — I2510 Atherosclerotic heart disease of native coronary artery without angina pectoris: Secondary | ICD-10-CM

## 2021-01-02 DIAGNOSIS — I480 Paroxysmal atrial fibrillation: Secondary | ICD-10-CM

## 2021-01-13 ENCOUNTER — Telehealth (HOSPITAL_COMMUNITY): Payer: Self-pay | Admitting: *Deleted

## 2021-01-13 NOTE — Telephone Encounter (Signed)
Pt left vm stating he moved to [place] and needs his records faxed to his new cardiologist. Records faxed to [phone] per pts request.

## 2021-01-14 ENCOUNTER — Telehealth (HOSPITAL_COMMUNITY): Payer: Self-pay | Admitting: Vascular Surgery

## 2021-01-14 ENCOUNTER — Telehealth (HOSPITAL_COMMUNITY): Payer: Self-pay | Admitting: *Deleted

## 2021-01-14 NOTE — Telephone Encounter (Signed)
Pt needs medical records sent to [facility]  [phone]

## 2021-01-14 NOTE — Telephone Encounter (Signed)
Spoke with pt he is aware records have been faxed.

## 2021-01-24 ENCOUNTER — Other Ambulatory Visit: Payer: Self-pay | Admitting: *Deleted

## 2021-01-24 MED ORDER — ESOMEPRAZOLE MAGNESIUM 20 MG PO CPDR
DELAYED_RELEASE_CAPSULE | ORAL | 0 refills | Status: DC
Start: 2021-01-24 — End: 2021-06-08

## 2021-01-29 NOTE — Telephone Encounter (Signed)
Done

## 2021-02-06 NOTE — Telephone Encounter (Signed)
Received a fax requesting medical records from [place]. Records were successfully faxed to: [phone] ,which was the number provided.. Medical request form will be scanned into patients chart.

## 2021-02-24 ENCOUNTER — Ambulatory Visit: Payer: Medicare Other | Admitting: Family Medicine

## 2021-04-26 ENCOUNTER — Other Ambulatory Visit: Payer: Self-pay | Admitting: Family Medicine

## 2021-06-01 ENCOUNTER — Telehealth: Payer: Self-pay | Admitting: Family Medicine

## 2021-06-10 NOTE — Telephone Encounter (Signed)
Ruba with [facility] in [facility] called and said that pt had already picked up Omeprazole 40mg  2 weeks ago and wanted to let us know because its a similar drug to what was sent in by [place]. They said there callback is [phone]

## 2021-06-22 ENCOUNTER — Encounter: Payer: Self-pay | Admitting: Internal Medicine

## 2021-07-20 ENCOUNTER — Other Ambulatory Visit: Payer: Self-pay | Admitting: Family Medicine

## 2022-07-23 ENCOUNTER — Encounter (HOSPITAL_COMMUNITY): Payer: Self-pay | Admitting: *Deleted
